# Patient Record
Sex: Female | Born: 1980
Health system: Southern US, Community
[De-identification: ages and names within clinical notes are randomized; demographics above are authoritative.]

## PROBLEM LIST (undated history)

## (undated) DIAGNOSIS — D649 Anemia, unspecified: Secondary | ICD-10-CM

## (undated) DIAGNOSIS — I10 Essential (primary) hypertension: Secondary | ICD-10-CM

## (undated) DIAGNOSIS — G932 Benign intracranial hypertension: Secondary | ICD-10-CM

## (undated) DIAGNOSIS — E785 Hyperlipidemia, unspecified: Secondary | ICD-10-CM

## (undated) DIAGNOSIS — G510 Bell's palsy: Secondary | ICD-10-CM

## (undated) DIAGNOSIS — G4730 Sleep apnea, unspecified: Secondary | ICD-10-CM

## (undated) HISTORY — PX: EYELID LACERATION REPAIR: SHX1564

## (undated) HISTORY — DX: Bell's palsy: G51.0

## (undated) HISTORY — PX: TONSILLECTOMY AND ADENOIDECTOMY: SUR1326

## (undated) HISTORY — DX: Benign intracranial hypertension: G93.2

## (undated) HISTORY — DX: Hyperlipidemia, unspecified: E78.5

## (undated) HISTORY — PX: NASAL SEPTUM SURGERY: SHX37

## (undated) HISTORY — PX: BLADDER SURGERY: SHX569

---
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
## 1999-05-07 ENCOUNTER — Emergency Department (HOSPITAL_COMMUNITY): Admission: EM | Admit: 1999-05-07 | Discharge: 1999-05-07 | Payer: Self-pay | Admitting: Emergency Medicine

## 1999-05-07 ENCOUNTER — Encounter: Payer: Self-pay | Admitting: Emergency Medicine

## 1999-10-25 ENCOUNTER — Other Ambulatory Visit: Admission: RE | Admit: 1999-10-25 | Discharge: 1999-10-25 | Payer: Self-pay | Admitting: *Deleted

## 1999-11-26 ENCOUNTER — Other Ambulatory Visit: Admission: RE | Admit: 1999-11-26 | Discharge: 1999-11-26 | Payer: Self-pay | Admitting: *Deleted

## 2007-12-31 ENCOUNTER — Encounter: Payer: Self-pay | Admitting: Pulmonary Disease

## 2008-01-31 ENCOUNTER — Ambulatory Visit: Payer: Self-pay | Admitting: Pulmonary Disease

## 2008-01-31 ENCOUNTER — Ambulatory Visit: Payer: Self-pay | Admitting: Cardiology

## 2008-01-31 ENCOUNTER — Telehealth (INDEPENDENT_AMBULATORY_CARE_PROVIDER_SITE_OTHER): Payer: Self-pay | Admitting: *Deleted

## 2008-01-31 DIAGNOSIS — R0602 Shortness of breath: Secondary | ICD-10-CM | POA: Insufficient documentation

## 2008-01-31 DIAGNOSIS — J309 Allergic rhinitis, unspecified: Secondary | ICD-10-CM | POA: Insufficient documentation

## 2008-01-31 DIAGNOSIS — G4730 Sleep apnea, unspecified: Secondary | ICD-10-CM

## 2008-01-31 DIAGNOSIS — J45909 Unspecified asthma, uncomplicated: Secondary | ICD-10-CM

## 2008-01-31 HISTORY — DX: Allergic rhinitis, unspecified: J30.9

## 2008-01-31 HISTORY — DX: Unspecified asthma, uncomplicated: J45.909

## 2008-01-31 HISTORY — DX: Shortness of breath: R06.02

## 2008-02-14 ENCOUNTER — Ambulatory Visit: Payer: Self-pay | Admitting: Pulmonary Disease

## 2010-08-02 ENCOUNTER — Emergency Department (HOSPITAL_COMMUNITY)
Admission: EM | Admit: 2010-08-02 | Discharge: 2010-08-02 | Disposition: A | Discharge status: Home / Self Care | Payer: Self-pay | Attending: Emergency Medicine | Admitting: Emergency Medicine

## 2010-08-02 DIAGNOSIS — R509 Fever, unspecified: Secondary | ICD-10-CM | POA: Insufficient documentation

## 2010-08-02 DIAGNOSIS — M7989 Other specified soft tissue disorders: Secondary | ICD-10-CM | POA: Insufficient documentation

## 2010-08-02 DIAGNOSIS — M79609 Pain in unspecified limb: Secondary | ICD-10-CM | POA: Insufficient documentation

## 2010-08-02 DIAGNOSIS — I10 Essential (primary) hypertension: Secondary | ICD-10-CM | POA: Insufficient documentation

## 2010-08-02 LAB — BASIC METABOLIC PANEL
BUN: 9 mg/dL (ref 6–23)
CO2: 27 mEq/L (ref 19–32)
Calcium: 9.3 mg/dL (ref 8.4–10.5)
Chloride: 101 mEq/L (ref 96–112)
Creatinine, Ser: 0.63 mg/dL (ref 0.4–1.2)
GFR calc Af Amer: >60 mL/min (ref >60)
GFR calc non Af Amer: >60 mL/min (ref >60)
Glucose, Bld: 88 mg/dL (ref 70–99)
Potassium: 3.6 mEq/L (ref 3.5–5.1)
Sodium: 137 mEq/L (ref 135–145)

## 2010-08-02 LAB — DIFFERENTIAL
Basophils Absolute: 0 10*3/uL (ref 0.0–0.1)
Basophils Relative: 0 % (ref 0–1)
Eosinophils Absolute: 0.4 10*3/uL (ref 0.0–0.7)
Eosinophils Relative: 3 % (ref 0–5)
Lymphocytes Relative: 20 % (ref 12–46)
Lymphs Abs: 2.8 10*3/uL (ref 0.7–4.0)
Monocytes Absolute: 0.7 10*3/uL (ref 0.1–1.0)
Monocytes Relative: 5 % (ref 3–12)
Neutro Abs: 10 10*3/uL — ABNORMAL HIGH (ref 1.7–7.7)
Neutrophils Relative %: 72 % (ref 43–77)

## 2010-08-02 LAB — CBC
HCT: 36.6 % (ref 36.0–46.0)
Hemoglobin: 12.4 g/dL (ref 12.0–15.0)
MCH: 28.3 pg (ref 26.0–34.0)
MCHC: 33.9 g/dL (ref 30.0–36.0)
MCV: 83.6 fL (ref 78.0–100.0)
Platelets: 330 10*3/uL (ref 150–400)
RBC: 4.38 MIL/uL (ref 3.87–5.11)
RDW: 13.3 % (ref 11.5–15.5)
WBC: 13.8 10*3/uL — ABNORMAL HIGH (ref 4.0–10.5)

## 2010-12-27 DIAGNOSIS — G4733 Obstructive sleep apnea (adult) (pediatric): Secondary | ICD-10-CM

## 2010-12-27 HISTORY — DX: Obstructive sleep apnea (adult) (pediatric): G47.33

## 2011-02-03 DIAGNOSIS — I10 Essential (primary) hypertension: Secondary | ICD-10-CM | POA: Insufficient documentation

## 2011-02-03 HISTORY — DX: Essential (primary) hypertension: I10

## 2011-06-02 DIAGNOSIS — IMO0002 Reserved for concepts with insufficient information to code with codable children: Secondary | ICD-10-CM | POA: Insufficient documentation

## 2011-06-02 HISTORY — DX: Reserved for concepts with insufficient information to code with codable children: IMO0002

## 2011-12-03 DIAGNOSIS — Z720 Tobacco use: Secondary | ICD-10-CM

## 2011-12-03 HISTORY — DX: Tobacco use: Z72.0

## 2012-02-04 DIAGNOSIS — F39 Unspecified mood [affective] disorder: Secondary | ICD-10-CM

## 2012-02-04 DIAGNOSIS — F4310 Post-traumatic stress disorder, unspecified: Secondary | ICD-10-CM

## 2012-02-04 HISTORY — DX: Unspecified mood (affective) disorder: F39

## 2012-02-04 HISTORY — DX: Post-traumatic stress disorder, unspecified: F43.10

## 2012-10-12 DIAGNOSIS — M1711 Unilateral primary osteoarthritis, right knee: Secondary | ICD-10-CM | POA: Insufficient documentation

## 2012-10-12 DIAGNOSIS — M222X9 Patellofemoral disorders, unspecified knee: Secondary | ICD-10-CM

## 2012-10-12 HISTORY — DX: Patellofemoral disorders, unspecified knee: M22.2X9

## 2012-10-12 HISTORY — DX: Unilateral primary osteoarthritis, right knee: M17.11

## 2012-12-03 DIAGNOSIS — K219 Gastro-esophageal reflux disease without esophagitis: Secondary | ICD-10-CM

## 2012-12-03 HISTORY — DX: Gastro-esophageal reflux disease without esophagitis: K21.9

## 2013-03-17 HISTORY — PX: VAGINAL HYSTERECTOMY: SHX2639

## 2013-08-01 DIAGNOSIS — N8502 Endometrial intraepithelial neoplasia [EIN]: Secondary | ICD-10-CM | POA: Insufficient documentation

## 2013-08-01 HISTORY — DX: Endometrial intraepithelial neoplasia (EIN): N85.02

## 2013-08-12 DIAGNOSIS — N3946 Mixed incontinence: Secondary | ICD-10-CM | POA: Insufficient documentation

## 2013-08-12 HISTORY — DX: Mixed incontinence: N39.46

## 2013-08-19 DIAGNOSIS — R339 Retention of urine, unspecified: Secondary | ICD-10-CM | POA: Insufficient documentation

## 2013-08-19 HISTORY — DX: Retention of urine, unspecified: R33.9

## 2013-09-01 DIAGNOSIS — M5417 Radiculopathy, lumbosacral region: Secondary | ICD-10-CM

## 2013-09-01 HISTORY — DX: Radiculopathy, lumbosacral region: M54.17

## 2014-02-21 DIAGNOSIS — F1010 Alcohol abuse, uncomplicated: Secondary | ICD-10-CM

## 2014-02-21 HISTORY — DX: Alcohol abuse, uncomplicated: F10.10

## 2014-05-02 DIAGNOSIS — F339 Major depressive disorder, recurrent, unspecified: Secondary | ICD-10-CM

## 2014-05-02 HISTORY — DX: Major depressive disorder, recurrent, unspecified: F33.9

## 2014-07-18 DIAGNOSIS — M5412 Radiculopathy, cervical region: Secondary | ICD-10-CM | POA: Insufficient documentation

## 2014-07-18 HISTORY — DX: Radiculopathy, cervical region: M54.12

## 2014-07-27 DIAGNOSIS — G43109 Migraine with aura, not intractable, without status migrainosus: Secondary | ICD-10-CM

## 2014-07-27 DIAGNOSIS — G4440 Drug-induced headache, not elsewhere classified, not intractable: Secondary | ICD-10-CM

## 2014-07-27 HISTORY — DX: Migraine with aura, not intractable, without status migrainosus: G43.109

## 2014-07-27 HISTORY — DX: Drug-induced headache, not elsewhere classified, not intractable: G44.40

## 2016-05-12 DIAGNOSIS — G4733 Obstructive sleep apnea (adult) (pediatric): Secondary | ICD-10-CM

## 2016-05-12 DIAGNOSIS — Z8659 Personal history of other mental and behavioral disorders: Secondary | ICD-10-CM | POA: Diagnosis not present

## 2016-05-12 DIAGNOSIS — F418 Other specified anxiety disorders: Secondary | ICD-10-CM | POA: Diagnosis not present

## 2016-05-12 DIAGNOSIS — R079 Chest pain, unspecified: Secondary | ICD-10-CM

## 2016-05-12 DIAGNOSIS — Z720 Tobacco use: Secondary | ICD-10-CM

## 2016-05-12 DIAGNOSIS — G629 Polyneuropathy, unspecified: Secondary | ICD-10-CM | POA: Diagnosis not present

## 2016-05-12 DIAGNOSIS — I10 Essential (primary) hypertension: Secondary | ICD-10-CM | POA: Diagnosis not present

## 2016-05-13 DIAGNOSIS — Z720 Tobacco use: Secondary | ICD-10-CM | POA: Diagnosis not present

## 2016-05-13 DIAGNOSIS — Z8659 Personal history of other mental and behavioral disorders: Secondary | ICD-10-CM | POA: Diagnosis not present

## 2016-05-13 DIAGNOSIS — I10 Essential (primary) hypertension: Secondary | ICD-10-CM | POA: Diagnosis not present

## 2016-05-13 DIAGNOSIS — F418 Other specified anxiety disorders: Secondary | ICD-10-CM | POA: Diagnosis not present

## 2016-05-13 DIAGNOSIS — G629 Polyneuropathy, unspecified: Secondary | ICD-10-CM | POA: Diagnosis not present

## 2016-05-13 DIAGNOSIS — R079 Chest pain, unspecified: Secondary | ICD-10-CM | POA: Diagnosis not present

## 2016-07-11 ENCOUNTER — Emergency Department (HOSPITAL_COMMUNITY): Payer: Medicare Other

## 2016-07-11 ENCOUNTER — Encounter (HOSPITAL_COMMUNITY): Payer: Self-pay

## 2016-07-11 ENCOUNTER — Emergency Department (HOSPITAL_COMMUNITY)
Admission: EM | Admit: 2016-07-11 | Discharge: 2016-07-12 | Disposition: A | Discharge status: Home / Self Care | Payer: Medicare Other | Attending: Emergency Medicine | Admitting: Emergency Medicine

## 2016-07-11 DIAGNOSIS — T461X5A Adverse effect of calcium-channel blockers, initial encounter: Secondary | ICD-10-CM | POA: Insufficient documentation

## 2016-07-11 DIAGNOSIS — Y638 Failure in dosage during other surgical and medical care: Secondary | ICD-10-CM | POA: Diagnosis not present

## 2016-07-11 DIAGNOSIS — R0789 Other chest pain: Secondary | ICD-10-CM | POA: Diagnosis not present

## 2016-07-11 DIAGNOSIS — J45909 Unspecified asthma, uncomplicated: Secondary | ICD-10-CM | POA: Diagnosis not present

## 2016-07-11 DIAGNOSIS — T887XXA Unspecified adverse effect of drug or medicament, initial encounter: Secondary | ICD-10-CM

## 2016-07-11 DIAGNOSIS — I10 Essential (primary) hypertension: Secondary | ICD-10-CM | POA: Insufficient documentation

## 2016-07-11 DIAGNOSIS — R079 Chest pain, unspecified: Secondary | ICD-10-CM | POA: Diagnosis present

## 2016-07-11 HISTORY — DX: Essential (primary) hypertension: I10

## 2016-07-11 HISTORY — DX: Sleep apnea, unspecified: G47.30

## 2016-07-11 LAB — CBC
HEMATOCRIT: 41.6 % (ref 36.0–46.0)
HEMOGLOBIN: 13.8 g/dL (ref 12.0–15.0)
MCH: 29.7 pg (ref 26.0–34.0)
MCHC: 33.2 g/dL (ref 30.0–36.0)
MCV: 89.5 fL (ref 78.0–100.0)
Platelets: 345 10*3/uL (ref 150–400)
RBC: 4.65 MIL/uL (ref 3.87–5.11)
RDW: 13.4 % (ref 11.5–15.5)
WBC: 15.3 10*3/uL — ABNORMAL HIGH (ref 4.0–10.5)

## 2016-07-11 LAB — I-STAT TROPONIN, ED: Troponin i, poc: 0 ng/mL (ref 0.00–0.08)

## 2016-07-11 LAB — BASIC METABOLIC PANEL
ANION GAP: 12 (ref 5–15)
BUN: 13 mg/dL (ref 6–20)
CO2: 24 mmol/L (ref 22–32)
Calcium: 9.4 mg/dL (ref 8.9–10.3)
Chloride: 99 mmol/L — ABNORMAL LOW (ref 101–111)
Creatinine, Ser: 0.64 mg/dL (ref 0.44–1.00)
GFR calc Af Amer: >60 mL/min (ref >60)
GFR calc non Af Amer: >60 mL/min (ref >60)
GLUCOSE: 112 mg/dL — AB (ref 65–99)
POTASSIUM: 3.5 mmol/L (ref 3.5–5.1)
Sodium: 135 mmol/L (ref 135–145)

## 2016-07-11 NOTE — ED Triage Notes (Signed)
Pt complaining of L sided chest pain and L arm and L jaw numbness and tingling. Pt states started taking diltiazem today, states pain began after medication.

## 2016-07-12 DIAGNOSIS — R0789 Other chest pain: Secondary | ICD-10-CM | POA: Diagnosis not present

## 2016-07-12 LAB — I-STAT TROPONIN, ED: TROPONIN I, POC: 0 ng/mL (ref 0.00–0.08)

## 2016-07-12 MED ORDER — KETOROLAC TROMETHAMINE 30 MG/ML IJ SOLN
30.0000 mg | Freq: Once | INTRAMUSCULAR | Status: AC
Start: 2016-07-12 — End: 2016-07-12
  Administered 2016-07-12: 30 mg via INTRAVENOUS
  Filled 2016-07-12: qty 1

## 2016-07-12 NOTE — ED Notes (Signed)
Pt stable, understands discharge instructions, and reasons for return.   

## 2016-07-12 NOTE — ED Provider Notes (Signed)
[facility]-EMERGENCY DEPT Provider Note   CSN: [id number] Arrival date & time: 07/11/16  2027  By signing my name below, I, Freida Busman, attest that this documentation has been prepared under the direction and in the presence of Pricilla Loveless, MD . Electronically Signed: Freida Busman, Scribe. 07/12/2016. 12:31 AM.  History   Chief Complaint Chief Complaint  Patient presents with  . Chest Pain  . Medication Reaction   The history is provided by the patient. No language interpreter was used.    HPI Comments:  Ellen Shaw is a 36 y.o. female with a history of HTN, and SVT, who presents to the Emergency Department complaining of sharp, central/left sided CP which began ~6 hours ago. Pt notes radiation of pain in the left shoulder and tingling sensation in the left hand. Pt notes associated mild SOB and an episode of nausea PTA. The nausea has resolved at this time.She was started on Diltiazem yesterday AM (07/11/2016). She states she felt lightheaded and "tingly" all over about 30 min-1 hour after taking this med for the first time.  She spoke with nurses at her cardiologist's office and was advised to come to the ED for further evaluation if she felt worse.  She denies back pain, abdominal pain, diaphoresis, diarrhea, and  vomiting. She cannot recall any exacerbating factors. No recent long periods of immobilization or h/o DVT/PE. No lower extremity swelling or pain.    Cardiologist - [facility])  Past Medical History:  Diagnosis Date  . Hypertension   . Sleep apnea     Patient Active Problem List   Diagnosis Date Noted  . ALLERGIC RHINITIS 01/31/2008  . ASTHMA 01/31/2008  . SLEEP APNEA 01/31/2008  . DYSPNEA 01/31/2008    History reviewed. No pertinent surgical history.  OB History    No data available       Home Medications    Prior to Admission medications   Not on File    Family History History reviewed. No pertinent family history.  Social History Social  History  Substance Use Topics  . Smoking status: Never Smoker  . Smokeless tobacco: Never Used  . Alcohol use Yes     Allergies   Esomeprazole magnesium; Fentanyl; Flonase [fluticasone propionate]; Oxycodone; and Penicillins   Review of Systems Review of Systems  Constitutional: Negative for diaphoresis.  Respiratory: Positive for shortness of breath.   Cardiovascular: Positive for chest pain. Negative for leg swelling.  Gastrointestinal: Positive for nausea. Negative for abdominal pain, diarrhea and vomiting.  Musculoskeletal: Negative for back pain.  Neurological: Positive for light-headedness.       +Paresthesias   All other systems reviewed and are negative.    Physical Exam Updated Vital Signs BP (!) 96/50   Pulse (!) 58   Temp 97.9 F (36.6 C) (Oral)   Resp 12   SpO2 94%   Physical Exam  Constitutional: She is oriented to person, place, and time. She appears well-developed and well-nourished. No distress.  Morbidly Obese   HENT:  Head: Normocephalic and atraumatic.  Right Ear: External ear normal.  Left Ear: External ear normal.  Nose: Nose normal.  Eyes: Right eye exhibits no discharge. Left eye exhibits no discharge.  Cardiovascular: Normal rate, regular rhythm and normal heart sounds.   2+ radial pulses bilaterally   Pulmonary/Chest: Effort normal and breath sounds normal. She exhibits no tenderness.  Abdominal: Soft. There is no tenderness.  Musculoskeletal: She exhibits no edema.  No shoulder tenderness Nml strength and sensation in  BUE   Neurological: She is alert and oriented to person, place, and time.  Skin: Skin is warm and dry. She is not diaphoretic.  Nursing note and vitals reviewed.    ED Treatments / Results  DIAGNOSTIC STUDIES:  Oxygen Saturation is 98% on RA, normal by my interpretation.    COORDINATION OF CARE:  12:25 AM Discussed treatment plan with pt at bedside and pt agreed to plan.  Labs (all labs ordered are listed, but  only abnormal results are displayed) Labs Reviewed  BASIC METABOLIC PANEL - Abnormal; Notable for the following:       Result Value   Chloride 99 (*)    Glucose, Bld 112 (*)    All other components within normal limits  CBC - Abnormal; Notable for the following:    WBC 15.3 (*)    All other components within normal limits  I-STAT TROPOININ, ED  I-STAT TROPOININ, ED    EKG  EKG Interpretation  Date/Time:  Friday July 11 2016 20:33:53 EDT Ventricular Rate:  74 PR Interval:  202 QRS Duration: 78 QT Interval:  418 QTC Calculation: 463 R Axis:   50 Text Interpretation:  Normal sinus rhythm Low voltage QRS Borderline ECG Confirmed by Montgomery Rothlisberger MD, Timara Loma [phone]) on 07/11/2016 11:37:24 PM       EKG Interpretation  Date/Time:  Saturday July 12 2016 01:40:53 EDT Ventricular Rate:  59 PR Interval:  202 QRS Duration: 100 QT Interval:  484 QTC Calculation: 480 R Axis:   39 Text Interpretation:  Sinus rhythm Borderline prolonged PR interval Low voltage, precordial leads no significant change since yesterday. Confirmed by Criss Alvine MD, Aboubacar Matsuo [id number]) on 07/12/2016 1:51:50 AM        Radiology Dg Chest 2 View  Result Date: 07/11/2016 CLINICAL DATA:  Left-sided chest pain. EXAM: CHEST  2 VIEW COMPARISON:  May 11, 2016 FINDINGS: The patient is rotated to the left. No pneumothorax. No pulmonary nodules, masses, or focal infiltrates. The cardiomediastinal silhouette is stable. IMPRESSION: No active cardiopulmonary disease. Electronically Signed   By: Gerome Sam III M.D   On: 07/11/2016 21:12    Procedures Procedures (including critical care time)  Medications Ordered in ED Medications  ketorolac (TORADOL) 30 MG/ML injection 30 mg (30 mg Intravenous Given 07/12/16 0050)     Initial Impression / Assessment and Plan / ED Course  I have reviewed the triage vital signs and the nursing notes.  Pertinent labs & imaging results that were available during my care of the patient  were reviewed by me and considered in my medical decision making (see chart for details).     Patient workup was unremarkable including no ECG changes or troponin elevation. I think PE, dissection, and ACS are all unlikely. She has an elevated WBC but the etiology is unclear. There are no infectious symptoms. I think otherwise she has some nonspecific side effects from the Cardizem that are somewhat expected such as fatigue and dizziness. No hypotension. Overall appears well.  Final Clinical Impressions(s) / ED Diagnoses   Final diagnoses:  Atypical chest pain  Medication side effect    New Prescriptions New Prescriptions   No medications on file   I personally performed the services described in this documentation, which was scribed in my presence. The recorded information has been reviewed and is accurate.     Pricilla Loveless, MD 07/12/16 1104

## 2016-07-15 DIAGNOSIS — I471 Supraventricular tachycardia, unspecified: Secondary | ICD-10-CM

## 2016-07-15 HISTORY — DX: Supraventricular tachycardia: I47.1

## 2016-07-15 HISTORY — DX: Supraventricular tachycardia, unspecified: I47.10

## 2016-07-22 DIAGNOSIS — F419 Anxiety disorder, unspecified: Secondary | ICD-10-CM

## 2016-07-22 DIAGNOSIS — M1990 Unspecified osteoarthritis, unspecified site: Secondary | ICD-10-CM

## 2016-07-22 HISTORY — DX: Unspecified osteoarthritis, unspecified site: M19.90

## 2016-07-22 HISTORY — DX: Anxiety disorder, unspecified: F41.9

## 2017-01-06 DIAGNOSIS — Z6841 Body Mass Index (BMI) 40.0 and over, adult: Secondary | ICD-10-CM | POA: Insufficient documentation

## 2017-01-06 HISTORY — DX: Body Mass Index (BMI) 40.0 and over, adult: Z684

## 2017-03-03 DIAGNOSIS — B3731 Acute candidiasis of vulva and vagina: Secondary | ICD-10-CM

## 2017-03-03 DIAGNOSIS — Z8619 Personal history of other infectious and parasitic diseases: Secondary | ICD-10-CM

## 2017-03-03 DIAGNOSIS — B373 Candidiasis of vulva and vagina: Secondary | ICD-10-CM

## 2017-03-03 HISTORY — DX: Candidiasis of vulva and vagina: B37.3

## 2017-03-03 HISTORY — DX: Acute candidiasis of vulva and vagina: B37.31

## 2017-03-03 HISTORY — DX: Personal history of other infectious and parasitic diseases: Z86.19

## 2017-06-21 DIAGNOSIS — I10 Essential (primary) hypertension: Secondary | ICD-10-CM | POA: Diagnosis not present

## 2017-06-21 DIAGNOSIS — R079 Chest pain, unspecified: Secondary | ICD-10-CM

## 2017-06-22 DIAGNOSIS — I10 Essential (primary) hypertension: Secondary | ICD-10-CM | POA: Diagnosis not present

## 2017-06-22 DIAGNOSIS — R079 Chest pain, unspecified: Secondary | ICD-10-CM | POA: Diagnosis not present

## 2017-06-22 DIAGNOSIS — R0602 Shortness of breath: Secondary | ICD-10-CM

## 2017-06-30 DIAGNOSIS — F6089 Other specific personality disorders: Secondary | ICD-10-CM | POA: Insufficient documentation

## 2017-06-30 DIAGNOSIS — R7303 Prediabetes: Secondary | ICD-10-CM

## 2017-06-30 HISTORY — DX: Prediabetes: R73.03

## 2017-06-30 HISTORY — DX: Other specific personality disorders: F60.89

## 2017-09-01 DIAGNOSIS — L304 Erythema intertrigo: Secondary | ICD-10-CM

## 2017-09-01 HISTORY — DX: Erythema intertrigo: L30.4

## 2017-10-12 DIAGNOSIS — L739 Follicular disorder, unspecified: Secondary | ICD-10-CM

## 2017-10-12 HISTORY — DX: Follicular disorder, unspecified: L73.9

## 2018-06-28 DIAGNOSIS — K5900 Constipation, unspecified: Secondary | ICD-10-CM | POA: Insufficient documentation

## 2018-06-28 HISTORY — DX: Constipation, unspecified: K59.00

## 2019-05-31 DIAGNOSIS — D72829 Elevated white blood cell count, unspecified: Secondary | ICD-10-CM

## 2019-12-07 ENCOUNTER — Ambulatory Visit: Payer: Medicare Other | Admitting: Diagnostic Neuroimaging

## 2019-12-30 ENCOUNTER — Ambulatory Visit
Admission: RE | Admit: 2019-12-30 | Discharge: 2019-12-30 | Disposition: A | Discharge status: Home / Self Care | Payer: Medicare Other | Source: Ambulatory Visit | Attending: Family Medicine | Admitting: Family Medicine

## 2019-12-30 ENCOUNTER — Other Ambulatory Visit: Payer: Self-pay | Admitting: Family Medicine

## 2019-12-30 DIAGNOSIS — Z8616 Personal history of COVID-19: Secondary | ICD-10-CM

## 2019-12-30 DIAGNOSIS — R0600 Dyspnea, unspecified: Secondary | ICD-10-CM

## 2019-12-30 MED ORDER — IOPAMIDOL (ISOVUE-370) INJECTION 76%
75.0000 mL | Freq: Once | INTRAVENOUS | Status: AC | PRN
  Administered 2019-12-30: 75 mL via INTRAVENOUS

## 2020-02-13 ENCOUNTER — Encounter: Payer: Self-pay | Admitting: Cardiology

## 2020-02-13 ENCOUNTER — Other Ambulatory Visit: Payer: Self-pay

## 2020-02-13 DIAGNOSIS — Z8669 Personal history of other diseases of the nervous system and sense organs: Secondary | ICD-10-CM | POA: Insufficient documentation

## 2020-02-13 DIAGNOSIS — G4730 Sleep apnea, unspecified: Secondary | ICD-10-CM | POA: Insufficient documentation

## 2020-02-13 DIAGNOSIS — E785 Hyperlipidemia, unspecified: Secondary | ICD-10-CM | POA: Insufficient documentation

## 2020-02-13 DIAGNOSIS — Z889 Allergy status to unspecified drugs, medicaments and biological substances status: Secondary | ICD-10-CM | POA: Insufficient documentation

## 2020-02-13 DIAGNOSIS — I10 Essential (primary) hypertension: Secondary | ICD-10-CM | POA: Insufficient documentation

## 2020-02-13 HISTORY — DX: Allergy status to unspecified drugs, medicaments and biological substances: Z88.9

## 2020-02-13 HISTORY — DX: Personal history of other diseases of the nervous system and sense organs: Z86.69

## 2020-02-15 ENCOUNTER — Ambulatory Visit (INDEPENDENT_AMBULATORY_CARE_PROVIDER_SITE_OTHER): Payer: Medicare Other | Admitting: Cardiology

## 2020-02-15 ENCOUNTER — Other Ambulatory Visit: Payer: Self-pay

## 2020-02-15 ENCOUNTER — Ambulatory Visit (INDEPENDENT_AMBULATORY_CARE_PROVIDER_SITE_OTHER): Payer: Medicare Other

## 2020-02-15 ENCOUNTER — Encounter: Payer: Self-pay | Admitting: *Deleted

## 2020-02-15 VITALS — BP 110/76 | HR 86 | Ht 69.0 in | Wt 340.0 lb

## 2020-02-15 DIAGNOSIS — R0602 Shortness of breath: Secondary | ICD-10-CM | POA: Diagnosis not present

## 2020-02-15 DIAGNOSIS — I10 Essential (primary) hypertension: Secondary | ICD-10-CM | POA: Insufficient documentation

## 2020-02-15 DIAGNOSIS — I4510 Unspecified right bundle-branch block: Secondary | ICD-10-CM

## 2020-02-15 DIAGNOSIS — R002 Palpitations: Secondary | ICD-10-CM

## 2020-02-15 DIAGNOSIS — R600 Localized edema: Secondary | ICD-10-CM

## 2020-02-15 HISTORY — DX: Essential (primary) hypertension: I10

## 2020-02-15 HISTORY — DX: Palpitations: R00.2

## 2020-02-15 HISTORY — DX: Unspecified right bundle-branch block: I45.10

## 2020-02-15 HISTORY — DX: Localized edema: R60.0

## 2020-02-15 HISTORY — DX: Morbid (severe) obesity due to excess calories: E66.01

## 2020-02-15 MED ORDER — POTASSIUM CHLORIDE CRYS ER 20 MEQ PO TBCR: 20.0000 meq | EXTENDED_RELEASE_TABLET | ORAL | 3 refills | Status: DC

## 2020-02-15 MED ORDER — FUROSEMIDE 20 MG PO TABS: 20.0000 mg | ORAL_TABLET | ORAL | 3 refills | Status: DC

## 2020-02-15 NOTE — Patient Instructions (Signed)
Medication Instructions:  Your physician has recommended you make the following change in your medication:  START: Potassium chloride 20 meq take one tablet by mouth daily on Tuesday, Thursday, and Saturday.  START: Furosemide 20 mg take one tablet by mouth daily on Tuesday, Thursday, and Saturday.  *If you need a refill on your cardiac medications before your next appointment, please call your pharmacy*   Lab Work: None If you have labs (blood work) drawn today and your tests are completely normal, you will receive your results only by: Marland Kitchen MyChart Message (if you have MyChart) OR . A paper copy in the mail If you have any lab test that is abnormal or we need to change your treatment, we will call you to review the results.   Testing/Procedures: Your physician has requested that you have an echocardiogram. Echocardiography is a painless test that uses sound waves to create images of your heart. It provides your doctor with information about the size and shape of your heart and how well your heart's chambers and valves are working. This procedure takes approximately one hour. There are no restrictions for this procedure.  A zio monitor was ordered today. It will remain on for 14 days. You will then return monitor and event diary in provided box. It takes 1-2 weeks for report to be downloaded and returned to [place]. We will call you with the results. If monitor falls off or has orange flashing light, please call Zio for further instructions.      Follow-Up: At [facility], you and your health needs are our priority.  As part of our continuing mission to provide you with exceptional heart care, we have created designated Provider Care Teams.  These Care Teams include your primary Cardiologist (physician) and Advanced Practice Providers (APPs -  Physician Assistants and Nurse Practitioners) who all work together to provide you with the care you need, when you need it.  We recommend signing up for  the patient portal called "MyChart".  Sign up information is provided on this After Visit Summary.  MyChart is used to connect with patients for Virtual Visits (Telemedicine).  Patients are able to view lab/test results, encounter notes, upcoming appointments, etc.  Non-urgent messages can be sent to your provider as well.   To learn more about what you can do with MyChart, go to [email or web address].    Your next appointment:   8 week(s)  The format for your next appointment:   In Person  Provider:   Thomasene Ripple, DO   Other Instructions

## 2020-02-15 NOTE — Progress Notes (Signed)
Cardiology Office Note:    Date:  02/15/2020   ID:  Ellen Shaw, DOB January 20, 1981, MRN [id number]  PCP:  Marlyn Corporal, PA  Cardiologist:  Thomasene Ripple, DO  Electrophysiologist:  None   Referring MD: Marlyn Corporal, PA   Chief Complaint  Patient presents with  . Palpitations    History of Present Illness:    Ellen Shaw is a 39 y.o. female with a hx of hypertension, morbid obesity, is here today to be evaluated for palpitations.  Patient tells me that she was doing well however over the last several weeks she has had intermittent palpitations.  She notes that with this she has also had bilateral leg edema.  She describes her palpitations as abrupt onset of fast heartbeat which lasted for few minutes at a time prior to resolution.  But during these times she feels significant shortness of breath and chest tightness.  She has not had any syncope episodes but sometimes she feels really lightheaded.  The shortness of breath does bother her.  Recently bilateral leg edema has gotten significantly worse her PCP did order 5 Lasix pills which had help with the tightness.  Past Medical History:  Diagnosis Date  . Alcohol abuse 02/21/2014  . Allergic rhinitis 01/31/2008   Qualifier: Diagnosis of  By: Truman Hayward (AAMA), Epifanio Lesches of this note might be different from the original. Overview:  Qualifier: Diagnosis of  By: Truman Hayward Duncan Dull), Lynnae Sandhoff  . Anxiety 07/22/2016  . Arthritis 07/22/2016   Formatting of this note might be different from the original. Knees Formatting of this note might be different from the original. Overview:  Knees  . Asthma 01/31/2008   Qualifier: Diagnosis of  By: Truman Hayward (Duncan Dull), Epifanio Lesches of this note might be different from the original. Overview:  Qualifier: Diagnosis of  By: Truman Hayward Duncan Dull), Lynnae Sandhoff  . ASTHMA 01/31/2008   Qualifier: Diagnosis of  By: Truman Hayward Duncan Dull), Lynnae Sandhoff    . Bell's palsy    age 78-14  . Body mass index (BMI) of 50.0 to  59.9 in adult (HCC) 01/06/2017  . Complex atypical endometrial hyperplasia 08/01/2013  . Constipation 06/28/2018  . DYSPNEA 01/31/2008   Qualifier: Diagnosis of  By: Vassie Loll MD, [place] Locket    . Episodic mood disorder (HCC) 02/04/2012  . Essential hypertension 02/03/2011  . Folliculitis 10/12/2017  . GERD (gastroesophageal reflux disease) 12/03/2012  . History of Helicobacter pylori infection 03/03/2017  . Hx of migraines 02/13/2020  . Hyperlipidemia   . Hypertension   . Intertrigo 09/01/2017  . Lumbosacral radiculopathy 09/01/2013  . Major depressive disorder, recurrent episode (HCC) 05/02/2014  . Medication overuse headache 07/27/2014  . Migraine with aura and without status migrainosus, not intractable 07/27/2014   Formatting of this note might be different from the original. Follows with Dr. Weston Settle in Neurology. HX pseudotumor cerebri  . Mixed stress and urge urinary incontinence 08/12/2013  . Multiple allergies 02/13/2020  . Obstructive sleep apnea on CPAP 12/27/2010   Formatting of this note might be different from the original. Uses Cpap  Formatting of this note might be different from the original. AHI=21.8 AutoPAP at 15-20 with an EPR=3  Last Assessment & Plan:  Formatting of this note might be different from the original. Relevant Hx: Course: Daily Update: Today's Plan:  . 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 Patellofemoral pain syndrome 10/12/2012  . Personal history of other diseases of the nervous system and sense organs 03/18/1995  .  Posttraumatic stress disorder 02/04/2012   Last Assessment & Plan:  Formatting of this note might be different from the original. Relevant Hx: Course: Daily Update: Today's Plan:  Electronically signed by: Seward Speck, MD Resident 12/22/14 [phone]  . Prediabetes 06/30/2017   Formatting of this note might be different from the original. A1C 6.1 on 06/2017  . Primary osteoarthritis of right knee 10/12/2012  . Pseudotumor   . Right cervical  radiculopathy 07/18/2014  . Sleep apnea   . SVT (supraventricular tachycardia) (HCC) 07/15/2016  . Tobacco use 12/03/2011  . Umbilical cyst 06/02/2011  . Urinary retention 08/19/2013   Formatting of this note might be different from the original. S/P interstim placement with Urology  . Vulvovaginal candidiasis 03/03/2017    Past Surgical History:  Procedure Laterality Date  . BLADDER SURGERY  2016  . TONSILLECTOMY AND ADENOIDECTOMY    . VAGINAL HYSTERECTOMY  2015    Current Medications: Current Meds  Medication Sig  . albuterol (VENTOLIN HFA) 108 (90 Base) MCG/ACT inhaler every 4 (four) hours as needed. Every 4-6 hours as needed  . Cholecalciferol (VITAMIN D3) 1.25 MG (50000 UT) CAPS Take 1 capsule by mouth once a week.  Marland Kitchen EPINEPHrine 0.3 mg/0.3 mL IJ SOAJ injection Inject into the muscle.  . linaclotide (LINZESS) 290 MCG CAPS capsule Take 290 mcg by mouth daily before breakfast.  . loratadine (CLARITIN) 10 MG tablet Take 10 mg by mouth daily.  . ondansetron (ZOFRAN) 4 MG tablet Take 4 mg by mouth every 8 (eight) hours as needed for nausea or vomiting.  Marland Kitchen RISPERDAL CONSTA 25 MG injection Inject into the muscle every 14 (fourteen) days.      Allergies:   Ciprofloxacin, Clindamycin, Esomeprazole magnesium, Nitrofurantoin, Oxycodone, Sulfa antibiotics, Sulfamethoxazole-trimethoprim, Trimethoprim, Bee pollen, Citrus, Doxycycline, Fluticasone, Penicillins, Pollen extract, Bee venom, Esomeprazole magnesium, Fentanyl, Flonase [fluticasone propionate], Paliperidone, Pantoprazole, and Orange oil   Social History   Socioeconomic History  . Marital status: Legally Separated    Spouse name: Not on file  . Number of children: Not on file  . Years of education: Not on file  . Highest education level: Not on file  Occupational History  . Not on file  Tobacco Use  . Smoking status: Former Smoker    Quit date: 01/19/2020    Years since quitting: 0.0  . Smokeless tobacco: Never Used  Substance and  Sexual Activity  . Alcohol use: Never  . Drug use: No  . Sexual activity: Not on file  Other Topics Concern  . Not on file  Social History Narrative  . Not on file   Social Determinants of Health   Financial Resource Strain:   . Difficulty of Paying Living Expenses: Not on file  Food Insecurity:   . Worried About Programme researcher, broadcasting/film/video in the Last Year: Not on file  . Ran Out of Food in the Last Year: Not on file  Transportation Needs:   . Lack of Transportation (Medical): Not on file  . Lack of Transportation (Non-Medical): Not on file  Physical Activity:   . Days of Exercise per Week: Not on file  . Minutes of Exercise per Session: Not on file  Stress:   . Feeling of Stress : Not on file  Social Connections:   . Frequency of Communication with Friends and Family: Not on file  . Frequency of Social Gatherings with Friends and Family: Not on file  . Attends Religious Services: Not on file  . Active Member of Clubs  or Organizations: Not on file  . Attends BankerClub or Organization Meetings: Not on file  . Marital Status: Not on file     Family History: The patient's family history includes Atrial fibrillation in her father; COPD in her father; Congestive Heart Failure in her father; Glaucoma in her father; Heart attack in her maternal grandmother; High Cholesterol in her mother; Hypertension in her father; Kidney disease in her father; Lung cancer in her father and paternal grandmother; Macular degeneration in her father; Stroke in her father.  ROS:   Review of Systems  Constitution: Negative for decreased appetite, fever and weight gain.  HENT: Negative for congestion, ear discharge, hoarse voice and sore throat.   Eyes: Negative for discharge, redness, vision loss in right eye and visual halos.  Cardiovascular: Reports palpitations, shortness of breath, bilateral leg edema and chest tightness.  Negative for orthopnea respiratory: Negative for cough, hemoptysis, shortness of breath  and snoring.   Endocrine: Negative for heat intolerance and polyphagia.  Hematologic/Lymphatic: Negative for bleeding problem. Does not bruise/bleed easily.  Skin: Negative for flushing, nail changes, rash and suspicious lesions.  Musculoskeletal: Negative for arthritis, joint pain, muscle cramps, myalgias, neck pain and stiffness.  Gastrointestinal: Negative for abdominal pain, bowel incontinence, diarrhea and excessive appetite.  Genitourinary: Negative for decreased libido, genital sores and incomplete emptying.  Neurological: Negative for brief paralysis, focal weakness, headaches and loss of balance.  Psychiatric/Behavioral: Negative for altered mental status, depression and suicidal ideas.  Allergic/Immunologic: Negative for HIV exposure and persistent infections.    EKGs/Labs/Other Studies Reviewed:    The following studies were reviewed today:   EKG:  The ekg ordered today demonstrates sinus rhythm, heart rate 86 bpm with underlying right bundle branch block nonspecific ST changes.  No prior EKG for comparison.  Recent Labs: No results found for requested labs within last 8760 hours.  Recent Lipid Panel No results found for: CHOL, TRIG, HDL, CHOLHDL, VLDL, LDLCALC, LDLDIRECT  Physical Exam:    VS:  BP 110/76 (BP Location: Left Arm, Patient Position: Sitting, Cuff Size: Large)   Pulse 86   Ht 5\' 9"  (1.753 m)   Wt (!) 340 lb (154.2 kg)   SpO2 98%   BMI 50.21 kg/m     Wt Readings from Last 3 Encounters:  02/15/20 (!) 340 lb (154.2 kg)  01/26/20 (!) 344 lb (156 kg)     GEN: Well nourished, well developed in no acute distress HEENT: Normal NECK: No JVD; No carotid bruits LYMPHATICS: No lymphadenopathy CARDIAC: S1S2 noted,RRR, no murmurs, rubs, gallops RESPIRATORY:  Clear to auscultation without rales, wheezing or rhonchi  ABDOMEN: Soft, non-tender, non-distended, +bowel sounds, no guarding. EXTREMITIES: No edema, No cyanosis, no clubbing MUSCULOSKELETAL:  No  deformity  SKIN: Warm and dry NEUROLOGIC:  Alert and oriented x 3, non-focal PSYCHIATRIC:  Normal affect, good insight  ASSESSMENT:    1. Palpitations   2. Shortness of breath   3. Morbid obesity (HCC)   4. Primary hypertension   5. Bilateral leg edema   6. Right bundle branch block    PLAN:    I would like to rule out a cardiovascular etiology of this palpitation, therefore at this time I would like to placed a zio patch for 14 days.   In additon for her shortness of breath and bilateral leg edema a transthoracic echocardiogram will be ordered to assess LV/RV function and any structural abnormalities. Once these testing have been performed amd reviewed further reccomendations will be made. For now,  I do reccomend that the patient goes to the nearest ED if  symptoms recur.  Also, started patient on Lasix 20 mg Tuesday Thursdays and Saturdays with potassium supplement.  Hoping this will give her some relief as I think this could be multifactorial first I like to rule out any cardiomyopathy but venous insufficiency may be playing a role.  The patient understands the need to lose weight with diet and exercise. We have discussed specific strategies for this.  The patient is in agreement with the above plan. The patient left the office in stable condition.  The patient will follow up in 3 months or sooner if needed.   Medication Adjustments/Labs and Tests Ordered: Current medicines are reviewed at length with the patient today.  Concerns regarding medicines are outlined above.  Orders Placed This Encounter  Procedures  . LONG TERM MONITOR (3-14 DAYS)  . EKG 12-Lead  . ECHOCARDIOGRAM COMPLETE   Meds ordered this encounter  Medications  . furosemide (LASIX) 20 MG tablet    Sig: Take 1 tablet (20 mg total) by mouth 3 (three) times a week.    Dispense:  20 tablet    Refill:  3  . potassium chloride SA (KLOR-CON) 20 MEQ tablet    Sig: Take 1 tablet (20 mEq total) by mouth 3 (three)  times a week.    Dispense:  20 tablet    Refill:  3    Patient Instructions  Medication Instructions:  Your physician has recommended you make the following change in your medication:  START: Potassium chloride 20 meq take one tablet by mouth daily on Tuesday, Thursday, and Saturday.  START: Furosemide 20 mg take one tablet by mouth daily on Tuesday, Thursday, and Saturday.  *If you need a refill on your cardiac medications before your next appointment, please call your pharmacy*   Lab Work: None If you have labs (blood work) drawn today and your tests are completely normal, you will receive your results only by: Marland Kitchen MyChart Message (if you have MyChart) OR . A paper copy in the mail If you have any lab test that is abnormal or we need to change your treatment, we will call you to review the results.   Testing/Procedures: Your physician has requested that you have an echocardiogram. Echocardiography is a painless test that uses sound waves to create images of your heart. It provides your doctor with information about the size and shape of your heart and how well your heart's chambers and valves are working. This procedure takes approximately one hour. There are no restrictions for this procedure.  A zio monitor was ordered today. It will remain on for 14 days. You will then return monitor and event diary in provided box. It takes 1-2 weeks for report to be downloaded and returned to [place]. We will call you with the results. If monitor falls off or has orange flashing light, please call Zio for further instructions.      Follow-Up: At [facility], you and your health needs are our priority.  As part of our continuing mission to provide you with exceptional heart care, we have created designated Provider Care Teams.  These Care Teams include your primary Cardiologist (physician) and Advanced Practice Providers (APPs -  Physician Assistants and Nurse Practitioners) who all work together to  provide you with the care you need, when you need it.  We recommend signing up for the patient portal called "MyChart".  Sign up information is provided on this After  Visit Summary.  MyChart is used to connect with patients for Virtual Visits (Telemedicine).  Patients are able to view lab/test results, encounter notes, upcoming appointments, etc.  Non-urgent messages can be sent to your provider as well.   To learn more about what you can do with MyChart, go to [email or web address].    Your next appointment:   8 week(s)  The format for your next appointment:   In Person  Provider:   Thomasene Ripple, DO   Other Instructions      Adopting a Healthy Lifestyle.  Know what a healthy weight is for you (roughly BMI <25) and aim to maintain this   Aim for 7+ servings of fruits and vegetables daily   65-80+ fluid ounces of water or unsweet tea for healthy kidneys   Limit to max 1 drink of alcohol per day; avoid smoking/tobacco   Limit animal fats in diet for cholesterol and heart health - choose grass fed whenever available   Avoid highly processed foods, and foods high in saturated/trans fats   Aim for low stress - take time to unwind and care for your mental health   Aim for 150 min of moderate intensity exercise weekly for heart health, and weights twice weekly for bone health   Aim for 7-9 hours of sleep daily   When it comes to diets, agreement about the perfect plan isnt easy to find, even among the experts. Experts at the [facility] [employer] developed an idea known as the Healthy Eating Plate. Just imagine a plate divided into logical, healthy portions.   The emphasis is on diet quality:   Load up on vegetables and fruits - one-half of your plate: Aim for color and variety, and remember that potatoes dont count.   Go for whole grains - one-quarter of your plate: Whole wheat, barley, wheat berries, quinoa, oats, brown rice, and foods made with them. If you  want pasta, go with whole wheat pasta.   Protein power - one-quarter of your plate: Fish, chicken, beans, and nuts are all healthy, versatile protein sources. Limit red meat.   The diet, however, does go beyond the plate, offering a few other suggestions.   Use healthy plant oils, such as olive, canola, soy, corn, sunflower and peanut. Check the labels, and avoid partially hydrogenated oil, which have unhealthy trans fats.   If youre thirsty, drink water. Coffee and tea are good in moderation, but skip sugary drinks and limit milk and dairy products to one or two daily servings.   The type of carbohydrate in the diet is more important than the amount. Some sources of carbohydrates, such as vegetables, fruits, whole grains, and beans-are healthier than others.   Finally, stay active  Signed, Thomasene Ripple, DO  02/15/2020 1:35 PM    [facility]

## 2020-02-17 ENCOUNTER — Encounter: Payer: Self-pay | Admitting: Diagnostic Neuroimaging

## 2020-02-17 ENCOUNTER — Ambulatory Visit (INDEPENDENT_AMBULATORY_CARE_PROVIDER_SITE_OTHER): Payer: Medicare Other | Admitting: Diagnostic Neuroimaging

## 2020-02-17 ENCOUNTER — Other Ambulatory Visit: Payer: Self-pay

## 2020-02-17 VITALS — BP 131/80 | HR 87 | Ht 69.0 in | Wt 341.0 lb

## 2020-02-17 DIAGNOSIS — G932 Benign intracranial hypertension: Secondary | ICD-10-CM | POA: Diagnosis not present

## 2020-02-17 DIAGNOSIS — G44209 Tension-type headache, unspecified, not intractable: Secondary | ICD-10-CM | POA: Diagnosis not present

## 2020-02-17 NOTE — Progress Notes (Signed)
[facility]  PATIENT: Ellen Shaw DOB: [birth year]  REFERRING CLINICIAN: Albin Shaw, OD HISTORY FROM: patient  REASON FOR VISIT: new consult    HISTORICAL  CHIEF COMPLAINT:  Chief Complaint  Patient presents with  . New Patient (Initial Visit)    pt alone, rm 7. pt was diagnosed with pseudotumor. she states that when she recently went to her Eye MD, he mentioned there appeared to be swelling noted. he referred here to reassess. she has been having headaches when she first wakes up in the morning.     HISTORY OF PRESENT ILLNESS:   39 year old female here for evaluation of headaches and history of pseudotumor cerebri.  Patient reports diagnosis of pseudotumor cerebri around the year 2000, managed by Dr. Johnell Shaw.  Apparently she was on topiramate and acetazolamide at that time.  Symptoms eventually improved.  She has been off of medication for several years.  In early 2021 headaches returned.  In July / August 2021 patient went to [facility] ophthalmology and neurology for evaluation and pseudotumor cerebri was ruled out.  She was treated empirically for migraine with topiramate but this caused side effect of tingling.  She was changed to Trokendi but she could not afford this due to insurance.  Patient followed up with her local optometrist Ellen Shaw in July 2021 as well and review of his notes mentions mild elevation of optic nerves.  Patient referred here for second opinion.  Since that time patient is having 2-3 headaches per month with mild pressure aching sensation in the back of her head and behind her eyes.  No nausea or vomiting.  No sensitive light or sound.  She has some occasional blurred vision.  She has some mild ringing in the right ear.  No transient visual obscurations.  No specific triggering or aggravating factors.  Patient has noted that since sleeping in the recliner for past few months her headaches have resolved.  Patient has sleep apnea  and is on CPAP.    REVIEW OF SYSTEMS: Full 14 system review of systems performed and negative with exception of: As per HPI.  ALLERGIES: Allergies  Allergen Reactions  . Ciprofloxacin Anaphylaxis, Rash and Shortness Of Breath    SOB   . Clindamycin Shortness Of Breath  . Esomeprazole Magnesium Hives and Shortness Of Breath  . Nitrofurantoin Shortness Of Breath  . Oxycodone Other (See Comments)    "shaking" Other: "I passed out." Other: convulsions "shaking" Other reaction(s): Dizziness, Hypotension, Wheezing (ALLERGY/intolerance) "shaking" Other: convulsions "shaking"   . Sulfa Antibiotics Rash    Other reaction(s): Rash, Shortness of breath   . Sulfamethoxazole-Trimethoprim Rash and Shortness Of Breath  . Trimethoprim     Other reaction(s): Rash, Rash, Shortness of breath  . Bee Pollen Other (See Comments)    Other reaction(s): Cough, Cough (ALLERGY/intolerance) Other-sneeze sneeze  . Citrus Hives  . Doxycycline Other (See Comments)    Muscle spasms  Bad muscle spasms   . Fluticasone Other (See Comments) and Rash    headache Other reaction(s): Other (See Comments) "Headache and nosebleed" headache   . Penicillins Rash  . Pollen Extract Other (See Comments)    Stuffy nose Other-sneeze Stuffy nose   . Bee Venom Swelling    Shortness of breathing  . Buspirone   . Esomeprazole Magnesium   . Fentanyl Other (See Comments)    "I pass out." Fainting  States "I passed out." fainting Other reaction(s): Other (See Comments) "I pass out." fainting   .  Flonase [Fluticasone Propionate]     "Headache and nosebleed"  . Lexapro [Escitalopram]   . Paliperidone     Other reaction(s): Dystonia  . Pantoprazole Hives    Relieved with Benadryl  . Orange Oil Rash    HOME MEDICATIONS: Outpatient Medications Prior to Visit  Medication Sig Dispense Refill  . albuterol (VENTOLIN HFA) 108 (90 Base) MCG/ACT inhaler every 4 (four) hours as needed. Every 4-6 hours as  needed    . Cholecalciferol (VITAMIN D3) 1.25 MG (50000 UT) CAPS Take 1 capsule by mouth once a week.    Marland Kitchen. EPINEPHrine 0.3 mg/0.3 mL IJ SOAJ injection Inject into the muscle.    . ferrous sulfate 325 (65 FE) MG tablet Take 325 mg by mouth daily with breakfast.    . furosemide (LASIX) 20 MG tablet Take 1 tablet (20 mg total) by mouth 3 (three) times a week. 20 tablet 3  . linaclotide (LINZESS) 290 MCG CAPS capsule Take 290 mcg by mouth daily before breakfast.    . loratadine (CLARITIN) 10 MG tablet Take 10 mg by mouth daily.    . ondansetron (ZOFRAN) 4 MG tablet Take 4 mg by mouth every 8 (eight) hours as needed for nausea or vomiting.    . potassium chloride SA (KLOR-CON) 20 MEQ tablet Take 1 tablet (20 mEq total) by mouth 3 (three) times a week. 20 tablet 3  . RISPERDAL CONSTA 25 MG injection Inject into the muscle every 14 (fourteen) days.     Marland Kitchen. sertraline (ZOLOFT) 50 MG tablet Take 50 mg by mouth daily.     No facility-administered medications prior to visit.    PAST MEDICAL HISTORY: Past Medical History:  Diagnosis Date  . Alcohol abuse 02/21/2014  . Allergic rhinitis 01/31/2008   Qualifier: Diagnosis of  By: Ellen HaywardFord,CMA (AAMA), Ellen Shaw    Formatting of this note might be different from the original. Overview:  Qualifier: Diagnosis of  By: Ellen HaywardFord,CMA Ellen Shaw(AAMA), Lynnae SandhoffSusanne  . Anxiety 07/22/2016  . Arthritis 07/22/2016   Formatting of this note might be different from the original. Knees Formatting of this note might be different from the original. Overview:  Knees  . Asthma 01/31/2008   Qualifier: Diagnosis of  By: Ellen HaywardFord,CMA (Ellen DullAAMA), Ellen Shaw    Formatting of this note might be different from the original. Overview:  Qualifier: Diagnosis of  By: Ellen HaywardFord,CMA Ellen Shaw(AAMA), Lynnae SandhoffSusanne  . ASTHMA 01/31/2008   Qualifier: Diagnosis of  By: Ellen HaywardFord,CMA Ellen Shaw(AAMA), Lynnae SandhoffSusanne    . Bell's palsy    age 41-14  . Body mass index (BMI) of 50.0 to 59.9 in adult (HCC) 01/06/2017  . Complex atypical endometrial hyperplasia 08/01/2013  .  Constipation 06/28/2018  . DYSPNEA 01/31/2008   Qualifier: Diagnosis of  By: Vassie LollAlva MD, Comer Locketakesh V.    . Episodic mood disorder (HCC) 02/04/2012  . Essential hypertension 02/03/2011  . Folliculitis 10/12/2017  . GERD (gastroesophageal reflux disease) 12/03/2012  . History of Helicobacter pylori infection 03/03/2017  . Hx of migraines 02/13/2020  . Hyperlipidemia   . Hypertension   . Intertrigo 09/01/2017  . Lumbosacral radiculopathy 09/01/2013  . Major depressive disorder, recurrent episode (HCC) 05/02/2014  . Medication overuse headache 07/27/2014  . Migraine with aura and without status migrainosus, not intractable 07/27/2014   Formatting of this note might be different from the original. Follows with Dr. Weston SettleWise in Neurology. HX pseudotumor cerebri  . Mixed stress and urge urinary incontinence 08/12/2013  . Multiple allergies 02/13/2020  . Obstructive sleep apnea on CPAP 12/27/2010   Formatting  of this note might be different from the original. Uses Cpap  Formatting of this note might be different from the original. AHI=21.8 AutoPAP at 15-20 with an EPR=3  Last Assessment & Plan:  Formatting of this note might be different from the original. Relevant Hx: Course: Daily Update: Today's Plan:  . 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 Patellofemoral pain syndrome 10/12/2012  . Personal history of other diseases of the nervous system and sense organs 03/18/1995  . Posttraumatic stress disorder 02/04/2012   Last Assessment & Plan:  Formatting of this note might be different from the original. Relevant Hx: Course: Daily Update: Today's Plan:  Electronically signed by: Seward Speck, MD Resident 12/22/14 [phone]  . Prediabetes 06/30/2017   Formatting of this note might be different from the original. A1C 6.1 on 06/2017  . Primary osteoarthritis of right knee 10/12/2012  . Pseudotumor   . Right cervical radiculopathy 07/18/2014  . Sleep apnea   . SVT (supraventricular tachycardia) (HCC) 07/15/2016  .  Tobacco use 12/03/2011  . Umbilical cyst 06/02/2011  . Urinary retention 08/19/2013   Formatting of this note might be different from the original. S/P interstim placement with Urology  . Vulvovaginal candidiasis 03/03/2017    PAST SURGICAL HISTORY: Past Surgical History:  Procedure Laterality Date  . BLADDER SURGERY  2016  . TONSILLECTOMY AND ADENOIDECTOMY    . VAGINAL HYSTERECTOMY  2015    FAMILY HISTORY: Family History  Problem Relation Age of Onset  . COPD Father   . Lung cancer Father   . Congestive Heart Failure Father   . Hypertension Father   . Kidney disease Father   . Atrial fibrillation Father   . Macular degeneration Father   . Glaucoma Father   . Stroke Father   . High Cholesterol Mother   . Heart attack Maternal Grandmother   . Lung cancer Paternal Grandmother     SOCIAL HISTORY: Social History   Socioeconomic History  . Marital status: Legally Separated    Spouse name: Not on file  . Number of children: Not on file  . Years of education: Not on file  . Highest education level: Not on file  Occupational History  . Not on file  Tobacco Use  . Smoking status: Former Smoker    Quit date: 01/19/2020    Years since quitting: 0.0  . Smokeless tobacco: Never Used  Substance and Sexual Activity  . Alcohol use: Not Currently  . Drug use: No  . Sexual activity: Not on file  Other Topics Concern  . Not on file  Social History Narrative  . Not on file   Social Determinants of Health   Financial Resource Strain:   . Difficulty of Paying Living Expenses: Not on file  Food Insecurity:   . Worried About Programme researcher, broadcasting/film/video in the Last Year: Not on file  . Ran Out of Food in the Last Year: Not on file  Transportation Needs:   . Lack of Transportation (Medical): Not on file  . Lack of Transportation (Non-Medical): Not on file  Physical Activity:   . Days of Exercise per Week: Not on file  . Minutes of Exercise per Session: Not on file  Stress:   . Feeling  of Stress : Not on file  Social Connections:   . Frequency of Communication with Friends and Family: Not on file  . Frequency of Social Gatherings with Friends and Family: Not on file  . Attends Religious Services: Not on  file  . Active Member of Clubs or Organizations: Not on file  . Attends Banker Meetings: Not on file  . Marital Status: Not on file  Intimate Partner Violence:   . Fear of Current or Ex-Partner: Not on file  . Emotionally Abused: Not on file  . Physically Abused: Not on file  . Sexually Abused: Not on file     PHYSICAL EXAM  GENERAL EXAM/CONSTITUTIONAL: Vitals:  Vitals:   02/17/20 0842  BP: 131/80  Pulse: 87  Weight: (!) 341 lb (154.7 kg)  Height: 5\' 9"  (1.753 m)     Body mass index is 50.36 kg/m. Wt Readings from Last 3 Encounters:  02/17/20 (!) 341 lb (154.7 kg)  02/15/20 (!) 340 lb (154.2 kg)  01/26/20 (!) 344 lb (156 kg)     Patient is in no distress; well developed, nourished and groomed; neck is supple  CARDIOVASCULAR:  Examination of carotid arteries is normal; no carotid bruits  Regular rate and rhythm, no murmurs  Examination of peripheral vascular system by observation and palpation is normal  EYES:  Ophthalmoscopic exam of optic discs and posterior segments is normal; no papilledema or hemorrhages  No exam data present  MUSCULOSKELETAL:  Gait, strength, tone, movements noted in Neurologic exam below  NEUROLOGIC: MENTAL STATUS:  No flowsheet data found.  awake, alert, oriented to person, place and time  recent and remote memory intact  normal attention and concentration  language fluent, comprehension intact, naming intact  fund of knowledge appropriate  CRANIAL NERVE:   2nd - no papilledema on fundoscopic exam  2nd, 3rd, 4th, 6th - pupils equal and reactive to light, visual fields full to confrontation, extraocular muscles intact, no nystagmus  5th - facial sensation symmetric  7th - facial  strength symmetric  8th - hearing intact  9th - palate elevates symmetrically, uvula midline  11th - shoulder shrug symmetric  12th - tongue protrusion midline  MOTOR:   normal bulk and tone, full strength in the BUE, BLE; EXCEPT DECR IN LLE (4/5)  SENSORY:   normal and symmetric to light touch, temperature, vibration; EXCEPT DECR IN LEFT FOOT  COORDINATION:   finger-nose-finger, fine finger movements normal  REFLEXES:   deep tendon reflexes TRACE and symmetric  GAIT/STATION:   narrow based gait; USES SINGLE POINT CANE     DIAGNOSTIC DATA (LABS, IMAGING, TESTING) - I reviewed patient records, labs, notes, testing and imaging myself where available.  Lab Results  Component Value Date   WBC 15.3 (H) 07/11/2016   HGB 13.8 07/11/2016   HCT 41.6 07/11/2016   MCV 89.5 07/11/2016   PLT 345 07/11/2016      Component Value Date/Time   NA 135 07/11/2016 2036   K 3.5 07/11/2016 2036   CL 99 (L) 07/11/2016 2036   CO2 24 07/11/2016 2036   GLUCOSE 112 (H) 07/11/2016 2036   BUN 13 07/11/2016 2036   CREATININE 0.64 07/11/2016 2036   CALCIUM 9.4 07/11/2016 2036   GFRNONAA >60 07/11/2016 2036   GFRAA >60 07/11/2016 2036   No results found for: CHOL, HDL, LDLCALC, LDLDIRECT, TRIG, CHOLHDL No results found for: 2037 No results found for: VITAMINB12 No results found for: TSH   03/30/19 MRI brain - No acute intracranial abnormality.      ASSESSMENT AND PLAN  39 y.o. year old female here with:  Dx:  1. Tension headache   2. IIH (idiopathic intracranial hypertension)       PLAN:  tension headaches (could  be related to OSA, obesity, dehydration) - 2-3 per month; mild; no associated symptoms - continue tylenol as needed  history of idiopathic intracranial hypertension (pseudotumor cerebri)  - normal optic discs per ophthalmology note 09/27/19 (Dr. Maurice Small) - mild optic disc elevation per optometry clinic note 10/05/19 (Ellen Shaw) - normal exam today; I  do not think IIH is active at this time - follow up with Ellen Shaw and [facility] neurology, ophthalmology, optometry  Return for return to PCP.    Suanne Marker, MD 02/17/2020, 9:18 AM Certified in Neurology, Neurophysiology and Neuroimaging  [facility] [address], [address] [place], [place] [address] [phone]

## 2020-02-17 NOTE — Patient Instructions (Signed)
tension headaches - 2-3 per month; mild; no associated symptoms - continue tylenol as needed  history of idiopathic intracranial hypertension (pseudotumor cerebri)  - normal optic discs per ophthalmology note 09/27/19 (Dr. Maurice Small) - mild optic disc elevation per optometry clinic note 10/05/19 (Dr. Precious Bard) - normal exam today; I do not think IIH is active at this time - follow up with Dr. Precious Bard and [facility] neurology, ophthalmology, optometry

## 2020-03-12 ENCOUNTER — Telehealth: Payer: Self-pay

## 2020-03-12 NOTE — Telephone Encounter (Signed)
Spoke with patient regarding results and recommendation.  Patient verbalizes understanding and is agreeable to plan of care. Advised patient to call back with any issues or concerns.  

## 2020-03-12 NOTE — Telephone Encounter (Signed)
-----   Message from Thomasene Ripple, DO sent at 03/08/2020  5:18 PM EST ----- Please have the patient come in sooner than February 1 to discuss her monitor results with me.

## 2020-03-13 ENCOUNTER — Other Ambulatory Visit: Payer: Self-pay

## 2020-03-13 ENCOUNTER — Ambulatory Visit (INDEPENDENT_AMBULATORY_CARE_PROVIDER_SITE_OTHER): Payer: Medicare Other

## 2020-03-13 ENCOUNTER — Telehealth: Payer: Self-pay

## 2020-03-13 DIAGNOSIS — R0602 Shortness of breath: Secondary | ICD-10-CM

## 2020-03-13 LAB — ECHOCARDIOGRAM COMPLETE
Area-P 1/2: 3.77 cm2
S' Lateral: 2.7 cm

## 2020-03-13 MED ORDER — PERFLUTREN LIPID MICROSPHERE: 1.0000 mL | INTRAVENOUS | Status: AC | PRN

## 2020-03-13 NOTE — Telephone Encounter (Signed)
Patient notified of results and verbalized understanding.  

## 2020-03-13 NOTE — Telephone Encounter (Signed)
-----   Message from Thomasene Ripple, DO sent at 03/13/2020  4:33 PM EST ----- Great news echo normal

## 2020-03-13 NOTE — Progress Notes (Signed)
Complete echocardiogram performed.  Jimmy Micca Matura RDCS, RVT  

## 2020-03-19 ENCOUNTER — Other Ambulatory Visit: Payer: Self-pay

## 2020-03-19 ENCOUNTER — Encounter: Payer: Self-pay | Admitting: Cardiology

## 2020-03-19 ENCOUNTER — Ambulatory Visit (INDEPENDENT_AMBULATORY_CARE_PROVIDER_SITE_OTHER): Payer: Medicare Other | Admitting: Cardiology

## 2020-03-19 VITALS — BP 128/84 | HR 80 | Ht 69.0 in | Wt 338.2 lb

## 2020-03-19 DIAGNOSIS — I493 Ventricular premature depolarization: Secondary | ICD-10-CM

## 2020-03-19 DIAGNOSIS — R079 Chest pain, unspecified: Secondary | ICD-10-CM

## 2020-03-19 DIAGNOSIS — I4729 Other ventricular tachycardia: Secondary | ICD-10-CM | POA: Insufficient documentation

## 2020-03-19 DIAGNOSIS — I472 Ventricular tachycardia: Secondary | ICD-10-CM

## 2020-03-19 DIAGNOSIS — R0789 Other chest pain: Secondary | ICD-10-CM | POA: Insufficient documentation

## 2020-03-19 DIAGNOSIS — R931 Abnormal findings on diagnostic imaging of heart and coronary circulation: Secondary | ICD-10-CM

## 2020-03-19 DIAGNOSIS — R600 Localized edema: Secondary | ICD-10-CM

## 2020-03-19 DIAGNOSIS — R9430 Abnormal result of cardiovascular function study, unspecified: Secondary | ICD-10-CM

## 2020-03-19 HISTORY — DX: Ventricular premature depolarization: I49.3

## 2020-03-19 HISTORY — DX: Other chest pain: R07.89

## 2020-03-19 HISTORY — DX: Ventricular tachycardia: I47.2

## 2020-03-19 HISTORY — DX: Other ventricular tachycardia: I47.29

## 2020-03-19 LAB — BASIC METABOLIC PANEL
BUN/Creatinine Ratio: 11 (ref 9–23)
BUN: 6 mg/dL (ref 6–24)
CO2: 27 mmol/L (ref 20–29)
Calcium: 9.2 mg/dL (ref 8.7–10.2)
Chloride: 101 mmol/L (ref 96–106)
Creatinine, Ser: 0.56 mg/dL — ABNORMAL LOW (ref 0.57–1.00)
GFR calc Af Amer: 134 mL/min/{1.73_m2} (ref >59)
GFR calc non Af Amer: 116 mL/min/{1.73_m2} (ref >59)
Glucose: 109 mg/dL — ABNORMAL HIGH (ref 65–99)
Potassium: 4.5 mmol/L (ref 3.5–5.2)
Sodium: 140 mmol/L (ref 134–144)

## 2020-03-19 LAB — MAGNESIUM: Magnesium: 2.1 mg/dL (ref 1.6–2.3)

## 2020-03-19 MED ORDER — METOPROLOL TARTRATE 100 MG PO TABS: 100.0000 mg | ORAL_TABLET | Freq: Two times a day (BID) | ORAL | 0 refills | Status: DC

## 2020-03-19 MED ORDER — DILTIAZEM HCL ER COATED BEADS 120 MG PO CP24: 120.0000 mg | ORAL_CAPSULE | Freq: Every day | ORAL | 2 refills | Status: DC

## 2020-03-19 NOTE — Patient Instructions (Addendum)
Medication Instructions:  Start Caridzem 120 mg daily *If you need a refill on your cardiac medications before your next appointment, please call your pharmacy*   Lab Work: BMET, Magnesium If you have labs (blood work) drawn today and your tests are completely normal, you will receive your results only by: Marland Kitchen MyChart Message (if you have MyChart) OR . A paper copy in the mail If you have any lab test that is abnormal or we need to change your treatment, we will call you to review the results.   Testing/Procedures: Your cardiac CT will be scheduled at the below location:   [facility] [address] [place], [place] [address] [phone]  If scheduled at [facility], please arrive at the [facility] main entrance of [facility] 30 minutes prior to test start time. Proceed to the [facility] Radiology Department (first floor) to check-in and test prep.  Please follow these instructions carefully (unless otherwise directed):  On the Night Before the Test: . Be sure to Drink plenty of water. . Do not consume any caffeinated/decaffeinated beverages or chocolate 12 hours prior to your test. . Do not take any antihistamines 12 hours prior to your test.  On the Day of the Test: . Drink plenty of water. Do not drink any water within one hour of the test. . Do not eat any food 4 hours prior to the test. . You may take your regular medications prior to the test.  . Take metoprolol (Lopressor) two hours prior to test. . HOLD Furosemide morning of the test. . FEMALES- please wear underwire-free bra if available       After the Test: . Drink plenty of water. . After receiving IV contrast, you may experience a mild flushed feeling. This is normal. . On occasion, you may experience a mild rash up to 24 hours after the test. This is not dangerous. If this occurs, you can take Benadryl 25 mg and increase your fluid intake. . If you experience trouble breathing,  this can be serious. If it is severe call 911 IMMEDIATELY. If it is mild, please call our office. . If you take any of these medications: Glipizide/Metformin, Avandament, Glucavance, please do not take 48 hours after completing test unless otherwise instructed.   Once we have confirmed authorization from your insurance company, we will call you to set up a date and time for your test. Based on how quickly your insurance processes prior authorizations requests, please allow up to 4 weeks to be contacted for scheduling your Cardiac CT appointment. Be advised that routine Cardiac CT appointments could be scheduled as many as 8 weeks after your provider has ordered it.  For non-scheduling related questions, please contact the cardiac imaging nurse navigator should you have any questions/concerns: Marchia Bond, Cardiac Imaging Nurse Navigator Burley Saver, Interim Cardiac Imaging Nurse [place] [facility] Direct Office Dial: [phone]   For scheduling needs, including cancellations and rescheduling, please call [place], [phone].     Follow-Up: At [facility], you and your health needs are our priority.  As part of our continuing mission to provide you with exceptional heart care, we have created designated Provider Care Teams.  These Care Teams include your primary Cardiologist (physician) and Advanced Practice Providers (APPs -  Physician Assistants and Nurse Practitioners) who all work together to provide you with the care you need, when you need it.  We recommend signing up for the patient portal called "  MyChart".  Sign up information is provided on this After Visit Summary.  MyChart is used to connect with patients for Virtual Visits (Telemedicine).  Patients are able to view lab/test results, encounter notes, upcoming appointments, etc.  Non-urgent messages can be sent to your provider as well.   To learn more about what you can do with MyChart, go to  [email or web address].    Your next appointment:   1 month(s)  The format for your next appointment:   In Person  Provider:   Berniece Salines, DO   Other Instructions Diltiazem Oral Tablets What is this medicine? DILTIAZEM (dil TYE a zem) is a calcium channel blocker. It relaxes your blood vessels and decreases the amount of work the heart has to do. It treats and/or prevents chest pain (also called angina). This medicine may be used for other purposes; ask your health care provider or pharmacist if you have questions. COMMON BRAND NAME(S): Cardizem What should I tell my health care provider before I take this medicine? They need to know if you have any of these conditions:  heart attack  heart disease  irregular heartbeat or rhythm  low blood pressure  an unusual or allergic reaction to diltiazem, other drugs, foods, dyes, or preservatives  pregnant or trying to get pregnant  breast-feeding How should I use this medicine? Take this drug by mouth. Take it as directed on the prescription label at the same time every day. Keep taking it unless your health care provider tells you to stop. Talk to your health care provider about the use of this drug in children. Special care may be needed. Overdosage: If you think you have taken too much of this medicine contact a poison control center or emergency room at once. NOTE: This medicine is only for you. Do not share this medicine with others. What if I miss a dose? If you miss a dose, take it as soon as you can. If it is almost time for your next dose, take only that dose. Do not take double or extra doses. What may interact with this medicine? Do not take this medicine with any of the following:  cisapride  hawthorn  pimozide  ranolazine  red yeast rice This medicine may also interact with the following medications:  buspirone  carbamazepine  cimetidine  cyclosporine  digoxin  local anesthetics or general  anesthetics  lovastatin  medicines for anxiety or difficulty sleeping like midazolam and triazolam  medicines for high blood pressure or heart problems  quinidine  rifampin, rifabutin, or rifapentine This list may not describe all possible interactions. Give your health care provider a list of all the medicines, herbs, non-prescription drugs, or dietary supplements you use. Also tell them if you smoke, drink alcohol, or use illegal drugs. Some items may interact with your medicine. What should I watch for while using this medicine? Visit your health care provider for regular checks on your progress. Check your blood pressure as directed. Ask your health care provider what your blood pressure should be. Also, find out when you should contact him or her. Do not treat yourself for coughs, colds, or pain while you are using this drug without asking your health care provider for advice. Some drugs may increase your blood pressure. This drug may cause serious skin reactions. They can happen weeks to months after starting the drug. Contact your health care provider right away if you notice fevers or flu-like symptoms with a rash. The rash may be red or purple  and then turn into blisters or peeling of the skin. Or, you might notice a red rash with swelling of the face, lips or lymph nodes in your neck or under your arms. You may get drowsy or dizzy. Do not drive, use machinery, or do anything that needs mental alertness until you know how this drug affects you. Do not stand up or sit up quickly, especially if you are an older patient. This reduces the risk of dizzy or fainting spells. What side effects may I notice from receiving this medicine? Side effects that you should report to your doctor or health care provider as soon as possible:  allergic reactions (skin rash, itching or hives; swelling of the face, lips, or tongue)  heart failure (trouble breathing; fast, irregular heartbeat; sudden weight  gain; swelling of the ankles, feet, hands; unusually weak or tired)  heartbeat rhythm changes (trouble breathing; chest pain; dizziness; fast, irregular heartbeat; feeling faint or lightheaded, falls)  liver injury (dark yellow or brown urine; general ill feeling or flu-like symptoms; loss of appetite, right upper belly pain; unusually weak or tired, yellowing of the eyes or skin)  low blood pressure (dizziness; feeling faint or lightheaded, falls; unusually weak or tired)  redness, blistering, peeling, or loosening of the skin, including inside the mouth Side effects that usually do not require medical attention (report to your doctor or health care provider if they continue or are bothersome):  changes in sex drive or performance  depressed mood  headache  sudden weight gain  nausea  sudden weight gain  trouble sleeping This list may not describe all possible side effects. Call your doctor for medical advice about side effects. You may report side effects to FDA at 1-800-FDA-1088. Where should I keep my medicine? Keep out of the reach of children and pets. Store at room temperature between 15 and 30 degrees C (59 and 86 degrees F). Protect from moisture. Keep the container tightly closed. Throw away any unused drug after the expiration date. NOTE: This sheet is a summary. It may not cover all possible information. If you have questions about this medicine, talk to your doctor, pharmacist, or health care provider.  2020 [employer]/Gold Standard (2018-12-07 18:13:24)

## 2020-03-19 NOTE — Progress Notes (Signed)
Cardiology Office Note:    Date:  03/19/2020   ID:  Ellen Shaw, DOB 1980-11-11, MRN [id number]  PCP:  Renaldo Reel, PA  Cardiologist:  Berniece Salines, DO  Electrophysiologist:  None   Referring MD: Renaldo Reel, PA   " I have been experiencing chest pain"   History of Present Illness:    Ellen Shaw is a 40 y.o. female with a hx of hypertension, morbid obesity is here today for follow-up visit.  Did see the patient on February 15, 2020 at that time discuss her palpitations and she had been started on Lasix for lab bilateral leg edema by her PCP.  At the conclusion of her last visit please monitor the patient get an echocardiogram.  She is here today to discuss these results as well.  She has been experiencing some chest discomfort.  She described as a midsternal chest discomfort which last for seconds and then resolved.  She does have some associated shortness of breath.  Nothing make it better or worse.  Past Medical History:  Diagnosis Date  . Alcohol abuse 02/21/2014  . Allergic rhinitis 01/31/2008   Qualifier: Diagnosis of  By: Lenn Cal (AAMA), Lindwood Coke of this note might be different from the original. Overview:  Qualifier: Diagnosis of  By: Lenn Cal Deborra Medina), Wynona Canes  . Anxiety 07/22/2016  . Arthritis 07/22/2016   Formatting of this note might be different from the original. Knees Formatting of this note might be different from the original. Overview:  Knees  . Asthma 01/31/2008   Qualifier: Diagnosis of  By: Lenn Cal (Deborra Medina), Lindwood Coke of this note might be different from the original. Overview:  Qualifier: Diagnosis of  By: Lenn Cal Deborra Medina), Wynona Canes  . ASTHMA 01/31/2008   Qualifier: Diagnosis of  By: Lenn Cal Deborra Medina), Wynona Canes    . Bell's palsy    age 71-14  . Body mass index (BMI) of 50.0 to 59.9 in adult ([place]) 01/06/2017  . Complex atypical endometrial hyperplasia 08/01/2013  . Constipation 06/28/2018  . DYSPNEA 01/31/2008   Qualifier: Diagnosis of   By: Elsworth Soho MD, Leanna Sato    . Episodic mood disorder ([place]) 02/04/2012  . Essential hypertension 02/03/2011  . Folliculitis 6/88/6484  . GERD (gastroesophageal reflux disease) 12/03/2012  . History of Helicobacter pylori infection 03/03/2017  . Hx of migraines 02/13/2020  . Hyperlipidemia   . Hypertension   . Intertrigo 09/01/2017  . Lumbosacral radiculopathy 09/01/2013  . Major depressive disorder, recurrent episode ([place]) 05/02/2014  . Medication overuse headache 07/27/2014  . Migraine with aura and without status migrainosus, not intractable 07/27/2014   Formatting of this note might be different from the original. Follows with Dr. Cristela Blue in Neurology. HX pseudotumor cerebri  . Mixed stress and urge urinary incontinence 08/12/2013  . Multiple allergies 02/13/2020  . Obstructive sleep apnea on CPAP 12/27/2010   Formatting of this note might be different from the original. Uses Cpap  Formatting of this note might be different from the original. AHI=21.8 AutoPAP at 15-20 with an EPR=3  Last Assessment & Plan:  Formatting of this note might be different from the original. Relevant Hx: Course: Daily Update: Today's Plan:  . 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 Patellofemoral pain syndrome 10/12/2012  . Personal history of other diseases of the nervous system and sense organs 03/18/1995  . Posttraumatic stress disorder 02/04/2012   Last Assessment & Plan:  Formatting of this note might be different from the original. Relevant  Hx: Course: Daily Update: Today's Plan:  Electronically signed by: Blenda Bridegroom, MD Resident 12/22/14 [phone]  . Prediabetes 06/30/2017   Formatting of this note might be different from the original. A1C 6.1 on 06/2017  . Primary osteoarthritis of right knee 10/12/2012  . Pseudotumor   . Right cervical radiculopathy 07/18/2014  . Sleep apnea   . SVT (supraventricular tachycardia) ([place]) 07/15/2016  . Tobacco use 12/03/2011  . Umbilical cyst 7/85/8850  . Urinary retention  08/19/2013   Formatting of this note might be different from the original. S/P interstim placement with Urology  . Vulvovaginal candidiasis 03/03/2017    Past Surgical History:  Procedure Laterality Date  . BLADDER SURGERY  2016  . TONSILLECTOMY AND ADENOIDECTOMY    . VAGINAL HYSTERECTOMY  2015    Current Medications: Current Meds  Medication Sig  . diltiazem (CARDIZEM CD) 120 MG 24 hr capsule Take 1 capsule (120 mg total) by mouth daily.  . metoprolol tartrate (LOPRESSOR) 100 MG tablet Take 1 tablet (100 mg total) by mouth 2 (two) times daily. Take two hours prior to your cardiac CT     Allergies:   Ciprofloxacin, Clindamycin, Esomeprazole magnesium, Nitrofurantoin, Oxycodone, Sulfa antibiotics, Sulfamethoxazole-trimethoprim, Trimethoprim, Bee pollen, Citrus, Doxycycline, Fluticasone, Penicillins, Pollen extract, Bee venom, Buspirone, Esomeprazole magnesium, Fentanyl, Flonase [fluticasone propionate], Fluticasone propionate, Lexapro [escitalopram], Paliperidone, Pantoprazole, and Orange oil   Social History   Socioeconomic History  . Marital status: Legally Separated    Spouse name: Not on file  . Number of children: Not on file  . Years of education: Not on file  . Highest education level: Not on file  Occupational History  . Not on file  Tobacco Use  . Smoking status: Former Smoker    Quit date: 01/19/2020    Years since quitting: 0.1  . Smokeless tobacco: Never Used  Substance and Sexual Activity  . Alcohol use: Not Currently  . Drug use: No  . Sexual activity: Not on file  Other Topics Concern  . Not on file  Social History Narrative  . Not on file   Social Determinants of Health   Financial Resource Strain: Not on file  Food Insecurity: Not on file  Transportation Needs: Not on file  Physical Activity: Not on file  Stress: Not on file  Social Connections: Not on file     Family History: The patient's family history includes Atrial fibrillation in her father;  COPD in her father; Congestive Heart Failure in her father; Glaucoma in her father; Heart attack in her maternal grandmother; High Cholesterol in her mother; Hypertension in her father; Kidney disease in her father; Lung cancer in her father and paternal grandmother; Macular degeneration in her father; Stroke in her father.  ROS:   Review of Systems  Constitution: Negative for decreased appetite, fever and weight gain.  HENT: Negative for congestion, ear discharge, hoarse voice and sore throat.   Eyes: Negative for discharge, redness, vision loss in right eye and visual halos.  Cardiovascular: Negative for chest pain, dyspnea on exertion, leg swelling, orthopnea and palpitations.  Respiratory: Negative for cough, hemoptysis, shortness of breath and snoring.   Endocrine: Negative for heat intolerance and polyphagia.  Hematologic/Lymphatic: Negative for bleeding problem. Does not bruise/bleed easily.  Skin: Negative for flushing, nail changes, rash and suspicious lesions.  Musculoskeletal: Negative for arthritis, joint pain, muscle cramps, myalgias, neck pain and stiffness.  Gastrointestinal: Negative for abdominal pain, bowel incontinence, diarrhea and excessive appetite.  Genitourinary: Negative for decreased libido, genital sores  and incomplete emptying.  Neurological: Negative for brief paralysis, focal weakness, headaches and loss of balance.  Psychiatric/Behavioral: Negative for altered mental status, depression and suicidal ideas.  Allergic/Immunologic: Negative for HIV exposure and persistent infections.    EKGs/Labs/Other Studies Reviewed:    The following studies were reviewed today:   EKG: None today  Zio monitor The patient wore the monitor for 14 days starting February 15, 2020   . Indication: Palpitations  The minimum heart rate was 47 bpm, maximum heart rate was 240 bpm, and average heart rate was 85 bpm. Predominant underlying rhythm was Sinus Rhythm.  First-degree AV  block was present.  9 Ventricular Tachycardia runs occurred, the run with the fastest interval lasting 5 beats with a maximum rate of 240 bpm, the longest lasting 5 beats with an average rate of 184 bpm.  Premature atrial complexes were rare less than 1%. Premature Ventricular complexes rare less than 1%.  No pauses, No AV block and no atrial fibrillation present. 14 patient triggered events and 13 diary events associated with premature ventricular complex.   Conclusion: This study is remarkable for the following:                             1.  Nonsustained ventricular tachycardia                             2.  Rare symptomatic premature ventricular complex.  Transthoracic Echocardiogram IMPRESSIONS  1. Left ventricular ejection fraction, by estimation, is 60 to 65%. The left ventricle has normal function. The left ventricle has no regional wall motion abnormalities. Left ventricular diastolic parameters were normal.  2. Right ventricular systolic function is normal. The right ventricular size is normal.  3. The mitral valve is normal in structure. No evidence of mitral valve regurgitation. No evidence of mitral stenosis.  4. The aortic valve is normal in structure. Aortic valve regurgitation is not visualized. No aortic stenosis is present.  5. The inferior vena cava is normal in size with greater than 50% respiratory variability, suggesting right atrial pressure of 3 mmHg.   FINDINGS  Left Ventricle: Left ventricular ejection fraction, by estimation, is 60 to 65%. The left ventricle has normal function. The left ventricle has no regional wall motion abnormalities. Definity contrast agent was given IV to delineate the left ventricular  endocardial borders. The left ventricular internal cavity size was normal in size. There is no left ventricular hypertrophy. Left ventricular diastolic parameters were normal.   Right Ventricle: The right ventricular size is normal. No increase  in right ventricular wall thickness. Right ventricular systolic function is  normal.   Left Atrium: Left atrial size was normal in size.   Right Atrium: Right atrial size was normal in size.   Pericardium: There is no evidence of pericardial effusion.   Mitral Valve: The mitral valve is normal in structure. No evidence of  mitral valve regurgitation. No evidence of mitral valve stenosis.   Tricuspid Valve: The tricuspid valve is normal in structure. Tricuspid  valve regurgitation is trivial. No evidence of tricuspid stenosis.   Aortic Valve: The aortic valve is normal in structure. Aortic valve  regurgitation is not visualized. No aortic stenosis is present.   Pulmonic Valve: The pulmonic valve was normal in structure. Pulmonic valve  regurgitation is not visualized. No evidence of pulmonic stenosis.   Aorta: The aortic root is normal  in size and structure.   Venous: The inferior vena cava is normal in size with greater than 50%  respiratory variability, suggesting right atrial pressure of 3 mmHg.   IAS/Shunts: No atrial level shunt detected by color flow Doppler.    Recent Labs: No results found for requested labs within last 8760 hours.  Recent Lipid Panel No results found for: CHOL, TRIG, HDL, CHOLHDL, VLDL, LDLCALC, LDLDIRECT  Physical Exam:    VS:  BP 128/84   Pulse 80   Ht '5\' 9"'  (1.753 m)   Wt (!) 338 lb 3.2 oz (153.4 kg)   SpO2 95%   BMI 49.94 kg/m     Wt Readings from Last 3 Encounters:  03/19/20 (!) 338 lb 3.2 oz (153.4 kg)  02/17/20 (!) 341 lb (154.7 kg)  02/15/20 (!) 340 lb (154.2 kg)     GEN: Well nourished, well developed in no acute distress HEENT: Normal NECK: No JVD; No carotid bruits LYMPHATICS: No lymphadenopathy CARDIAC: S1S2 noted,RRR, no murmurs, rubs, gallops RESPIRATORY:  Clear to auscultation without rales, wheezing or rhonchi  ABDOMEN: Soft, non-tender, non-distended, +bowel sounds, no guarding. EXTREMITIES: No edema, No cyanosis, no  clubbing MUSCULOSKELETAL:  No deformity  SKIN: Warm and dry NEUROLOGIC:  Alert and oriented x 3, non-focal PSYCHIATRIC:  Normal affect, good insight  ASSESSMENT:    1. NSVT (nonsustained ventricular tachycardia) (HCC)   2. Chest discomfort   3. PVC (premature ventricular contraction)   4. Chest pain, unspecified type   5. Abnormal result of cardiovascular function study, unspecified    6. Abnormal findings on diagnostic imaging of heart and coronary circulation    7. Bilateral leg edema    PLAN:     1.  We discussed her monitor findings.  She still does have palpitations been elected to start the patient with low-dose calcium channel blocker Cardizem 120 mg daily.  2.  In addition with her chest discomfort and shortness of breath I like to have the patient call cardiac CTA to understand if coronary disease is playing a role.  She does have risk factors for coronary artery disease as well. I discussed with the patient about the testing.  She is agreeable to proceed with this.    3.  She is still on her Lasix, her bilateral leg edema has improved and she also has had some weight loss since her last visit.  She will continue Lasix.  Blood work will be done today to assess kidney function and electrolytes.  4.  The patient understands the need to lose weight with diet and exercise. We have discussed specific strategies for this.   The patient is in agreement with the above plan. The patient left the office in stable condition.  The patient will follow up in 1 month due to medication change.   Medication Adjustments/Labs and Tests Ordered: Current medicines are reviewed at length with the patient today.  Concerns regarding medicines are outlined above.  Orders Placed This Encounter  Procedures  . CT CORONARY MORPH W/CTA COR W/SCORE W/CA W/CM &/OR WO/CM  . CT CORONARY FRACTIONAL FLOW RESERVE DATA PREP  . CT CORONARY FRACTIONAL FLOW RESERVE FLUID ANALYSIS  . Basic Metabolic Panel  (BMET)  . Magnesium   Meds ordered this encounter  Medications  . diltiazem (CARDIZEM CD) 120 MG 24 hr capsule    Sig: Take 1 capsule (120 mg total) by mouth daily.    Dispense:  30 capsule    Refill:  2  . metoprolol  tartrate (LOPRESSOR) 100 MG tablet    Sig: Take 1 tablet (100 mg total) by mouth 2 (two) times daily. Take two hours prior to your cardiac CT    Dispense:  1 tablet    Refill:  0    Patient Instructions  Medication Instructions:  Start Caridzem 120 mg daily *If you need a refill on your cardiac medications before your next appointment, please call your pharmacy*   Lab Work: BMET, Magnesium If you have labs (blood work) drawn today and your tests are completely normal, you will receive your results only by: Marland Kitchen MyChart Message (if you have MyChart) OR . A paper copy in the mail If you have any lab test that is abnormal or we need to change your treatment, we will call you to review the results.   Testing/Procedures: Your cardiac CT will be scheduled at the below location:   [facility] [address] [place], [place] [address] [phone]  If scheduled at [facility], please arrive at the [facility] main entrance of [facility] 30 minutes prior to test start time. Proceed to the [facility] Radiology Department (first floor) to check-in and test prep.  Please follow these instructions carefully (unless otherwise directed):  On the Night Before the Test: . Be sure to Drink plenty of water. . Do not consume any caffeinated/decaffeinated beverages or chocolate 12 hours prior to your test. . Do not take any antihistamines 12 hours prior to your test.  On the Day of the Test: . Drink plenty of water. Do not drink any water within one hour of the test. . Do not eat any food 4 hours prior to the test. . You may take your regular medications prior to the test.  . Take metoprolol (Lopressor) two hours prior to test. . HOLD  Furosemide morning of the test. . FEMALES- please wear underwire-free bra if available       After the Test: . Drink plenty of water. . After receiving IV contrast, you may experience a mild flushed feeling. This is normal. . On occasion, you may experience a mild rash up to 24 hours after the test. This is not dangerous. If this occurs, you can take Benadryl 25 mg and increase your fluid intake. . If you experience trouble breathing, this can be serious. If it is severe call 911 IMMEDIATELY. If it is mild, please call our office. . If you take any of these medications: Glipizide/Metformin, Avandament, Glucavance, please do not take 48 hours after completing test unless otherwise instructed.   Once we have confirmed authorization from your insurance company, we will call you to set up a date and time for your test. Based on how quickly your insurance processes prior authorizations requests, please allow up to 4 weeks to be contacted for scheduling your Cardiac CT appointment. Be advised that routine Cardiac CT appointments could be scheduled as many as 8 weeks after your provider has ordered it.  For non-scheduling related questions, please contact the cardiac imaging nurse navigator should you have any questions/concerns: Marchia Bond, Cardiac Imaging Nurse Navigator Burley Saver, Interim Cardiac Imaging Nurse [place] [facility] Direct Office Dial: [phone]   For scheduling needs, including cancellations and rescheduling, please call [place], [phone].     Follow-Up: At [facility], you and your health needs are our priority.  As part of our continuing mission to provide you with exceptional heart care, we have created designated Provider Care  Teams.  These Care Teams include your primary Cardiologist (physician) and Advanced Practice Providers (APPs -  Physician Assistants and Nurse Practitioners) who all work together to provide you with the care  you need, when you need it.  We recommend signing up for the patient portal called "MyChart".  Sign up information is provided on this After Visit Summary.  MyChart is used to connect with patients for Virtual Visits (Telemedicine).  Patients are able to view lab/test results, encounter notes, upcoming appointments, etc.  Non-urgent messages can be sent to your provider as well.   To learn more about what you can do with MyChart, go to [email or web address].    Your next appointment:   1 month(s)  The format for your next appointment:   In Person  Provider:   Berniece Salines, DO   Other Instructions Diltiazem Oral Tablets What is this medicine? DILTIAZEM (dil TYE a zem) is a calcium channel blocker. It relaxes your blood vessels and decreases the amount of work the heart has to do. It treats and/or prevents chest pain (also called angina). This medicine may be used for other purposes; ask your health care provider or pharmacist if you have questions. COMMON BRAND NAME(S): Cardizem What should I tell my health care provider before I take this medicine? They need to know if you have any of these conditions:  heart attack  heart disease  irregular heartbeat or rhythm  low blood pressure  an unusual or allergic reaction to diltiazem, other drugs, foods, dyes, or preservatives  pregnant or trying to get pregnant  breast-feeding How should I use this medicine? Take this drug by mouth. Take it as directed on the prescription label at the same time every day. Keep taking it unless your health care provider tells you to stop. Talk to your health care provider about the use of this drug in children. Special care may be needed. Overdosage: If you think you have taken too much of this medicine contact a poison control center or emergency room at once. NOTE: This medicine is only for you. Do not share this medicine with others. What if I miss a dose? If you miss a dose, take it as soon as  you can. If it is almost time for your next dose, take only that dose. Do not take double or extra doses. What may interact with this medicine? Do not take this medicine with any of the following:  cisapride  hawthorn  pimozide  ranolazine  red yeast rice This medicine may also interact with the following medications:  buspirone  carbamazepine  cimetidine  cyclosporine  digoxin  local anesthetics or general anesthetics  lovastatin  medicines for anxiety or difficulty sleeping like midazolam and triazolam  medicines for high blood pressure or heart problems  quinidine  rifampin, rifabutin, or rifapentine This list may not describe all possible interactions. Give your health care provider a list of all the medicines, herbs, non-prescription drugs, or dietary supplements you use. Also tell them if you smoke, drink alcohol, or use illegal drugs. Some items may interact with your medicine. What should I watch for while using this medicine? Visit your health care provider for regular checks on your progress. Check your blood pressure as directed. Ask your health care provider what your blood pressure should be. Also, find out when you should contact him or her. Do not treat yourself for coughs, colds, or pain while you are using this drug without asking your health care provider for advice.  Some drugs may increase your blood pressure. This drug may cause serious skin reactions. They can happen weeks to months after starting the drug. Contact your health care provider right away if you notice fevers or flu-like symptoms with a rash. The rash may be red or purple and then turn into blisters or peeling of the skin. Or, you might notice a red rash with swelling of the face, lips or lymph nodes in your neck or under your arms. You may get drowsy or dizzy. Do not drive, use machinery, or do anything that needs mental alertness until you know how this drug affects you. Do not stand up or  sit up quickly, especially if you are an older patient. This reduces the risk of dizzy or fainting spells. What side effects may I notice from receiving this medicine? Side effects that you should report to your doctor or health care provider as soon as possible:  allergic reactions (skin rash, itching or hives; swelling of the face, lips, or tongue)  heart failure (trouble breathing; fast, irregular heartbeat; sudden weight gain; swelling of the ankles, feet, hands; unusually weak or tired)  heartbeat rhythm changes (trouble breathing; chest pain; dizziness; fast, irregular heartbeat; feeling faint or lightheaded, falls)  liver injury (dark yellow or brown urine; general ill feeling or flu-like symptoms; loss of appetite, right upper belly pain; unusually weak or tired, yellowing of the eyes or skin)  low blood pressure (dizziness; feeling faint or lightheaded, falls; unusually weak or tired)  redness, blistering, peeling, or loosening of the skin, including inside the mouth Side effects that usually do not require medical attention (report to your doctor or health care provider if they continue or are bothersome):  changes in sex drive or performance  depressed mood  headache  sudden weight gain  nausea  sudden weight gain  trouble sleeping This list may not describe all possible side effects. Call your doctor for medical advice about side effects. You may report side effects to FDA at 1-800-FDA-1088. Where should I keep my medicine? Keep out of the reach of children and pets. Store at room temperature between 15 and 30 degrees C (59 and 86 degrees F). Protect from moisture. Keep the container tightly closed. Throw away any unused drug after the expiration date. NOTE: This sheet is a summary. It may not cover all possible information. If you have questions about this medicine, talk to your doctor, pharmacist, or health care provider.  2020 Elsevier/Gold Standard (2018-12-07  18:13:24)       Adopting a Healthy Lifestyle.  Know what a healthy weight is for you (roughly BMI <25) and aim to maintain this   Aim for 7+ servings of fruits and vegetables daily   65-80+ fluid ounces of water or unsweet tea for healthy kidneys   Limit to max 1 drink of alcohol per day; avoid smoking/tobacco   Limit animal fats in diet for cholesterol and heart health - choose grass fed whenever available   Avoid highly processed foods, and foods high in saturated/trans fats   Aim for low stress - take time to unwind and care for your mental health   Aim for 150 min of moderate intensity exercise weekly for heart health, and weights twice weekly for bone health   Aim for 7-9 hours of sleep daily   When it comes to diets, agreement about the perfect plan isnt easy to find, even among the experts. Experts at the [employer] developed an idea  known as the Healthy Eating Plate. Just imagine a plate divided into logical, healthy portions.   The emphasis is on diet quality:   Load up on vegetables and fruits - one-half of your plate: Aim for color and variety, and remember that potatoes dont count.   Go for whole grains - one-quarter of your plate: Whole wheat, barley, wheat berries, quinoa, oats, brown rice, and foods made with them. If you want pasta, go with whole wheat pasta.   Protein power - one-quarter of your plate: Fish, chicken, beans, and nuts are all healthy, versatile protein sources. Limit red meat.   The diet, however, does go beyond the plate, offering a few other suggestions.   Use healthy plant oils, such as olive, canola, soy, corn, sunflower and peanut. Check the labels, and avoid partially hydrogenated oil, which have unhealthy trans fats.   If youre thirsty, drink water. Coffee and tea are good in moderation, but skip sugary drinks and limit milk and dairy products to one or two daily servings.   The type of carbohydrate in the diet  is more important than the amount. Some sources of carbohydrates, such as vegetables, fruits, whole grains, and beans-are healthier than others.   Finally, stay active  Signed, Berniece Salines, DO  03/19/2020 9:06 AM    [place]

## 2020-03-20 ENCOUNTER — Telehealth: Payer: Self-pay

## 2020-03-20 NOTE — Telephone Encounter (Signed)
-----   Message from Thomasene Ripple, DO sent at 03/20/2020  9:11 AM EST ----- Labs normal, stable compared to 3 years ago

## 2020-03-20 NOTE — Telephone Encounter (Signed)
Spoke with patient regarding results and recommendation.  Patient verbalizes understanding and is agreeable to plan of care. Advised patient to call back with any issues or concerns.  

## 2020-03-21 ENCOUNTER — Telehealth: Payer: Self-pay | Admitting: Cardiology

## 2020-03-21 NOTE — Telephone Encounter (Signed)
Patient is calling stating that she wants to start walking as exercise and wants to know if Dr. Servando Salina recommends this. Please advise.

## 2020-03-21 NOTE — Telephone Encounter (Signed)
She is fine to exercise 

## 2020-03-21 NOTE — Telephone Encounter (Signed)
Spoke to the patient just now and let her know Dr. Tobb's recommendations. She verbalizes understanding and thanks me for the call back.  

## 2020-03-22 ENCOUNTER — Ambulatory Visit: Payer: Medicare Other | Admitting: Cardiology

## 2020-04-06 ENCOUNTER — Telehealth (HOSPITAL_COMMUNITY): Payer: Self-pay | Admitting: *Deleted

## 2020-04-06 NOTE — Telephone Encounter (Signed)
Reaching out to patient to offer assistance regarding upcoming cardiac imaging study; pt verbalizes understanding of appt date/time, parking situation and where to check in, pre-test NPO status and medications ordered, and verified current allergies; name and call back number provided for further questions should they arise  Daniesha Driver RN Navigator Cardiac Imaging [facility] [phone] office [phone] cell  

## 2020-04-09 ENCOUNTER — Encounter (HOSPITAL_COMMUNITY): Payer: Self-pay

## 2020-04-09 ENCOUNTER — Ambulatory Visit (HOSPITAL_COMMUNITY)
Admission: RE | Admit: 2020-04-09 | Discharge: 2020-04-09 | Disposition: A | Discharge status: Home / Self Care | Payer: Medicare Other | Source: Ambulatory Visit | Attending: Cardiology | Admitting: Cardiology

## 2020-04-09 ENCOUNTER — Encounter: Payer: Medicare Other | Admitting: *Deleted

## 2020-04-09 ENCOUNTER — Other Ambulatory Visit: Payer: Self-pay

## 2020-04-09 DIAGNOSIS — Z006 Encounter for examination for normal comparison and control in clinical research program: Secondary | ICD-10-CM

## 2020-04-09 DIAGNOSIS — R9430 Abnormal result of cardiovascular function study, unspecified: Secondary | ICD-10-CM | POA: Insufficient documentation

## 2020-04-09 DIAGNOSIS — R079 Chest pain, unspecified: Secondary | ICD-10-CM | POA: Diagnosis not present

## 2020-04-09 MED ORDER — NITROGLYCERIN 0.4 MG SL SUBL
0.8000 mg | SUBLINGUAL_TABLET | Freq: Once | SUBLINGUAL | Status: AC
  Administered 2020-04-09: 0.8 mg via SUBLINGUAL

## 2020-04-09 MED ORDER — NITROGLYCERIN 0.4 MG SL SUBL
SUBLINGUAL_TABLET | SUBLINGUAL | Status: AC
  Filled 2020-04-09: qty 2

## 2020-04-09 MED ORDER — IOHEXOL 350 MG/ML SOLN
100.0000 mL | Freq: Once | INTRAVENOUS | Status: AC | PRN
  Administered 2020-04-09: 100 mL via INTRAVENOUS

## 2020-04-09 NOTE — Research (Signed)
Subject Name: Ellen Shaw  Subject met inclusion and exclusion criteria.  The informed consent form, study requirements and expectations were reviewed with the subject and questions and concerns were addressed prior to the signing of the consent form.  The subject verbalized understanding of the trial requirements.  The subject agreed to participate in the Identify trial and signed the informed consent at [place] on 04/09/20  The informed consent was obtained prior to performance of any protocol-specific procedures for the subject.  A copy of the signed informed consent was given to the subject and a copy was placed in the subject's medical record.   [name] [name]

## 2020-04-10 ENCOUNTER — Other Ambulatory Visit: Payer: Self-pay

## 2020-04-10 ENCOUNTER — Telehealth: Payer: Self-pay

## 2020-04-10 DIAGNOSIS — G510 Bell's palsy: Secondary | ICD-10-CM | POA: Insufficient documentation

## 2020-04-10 DIAGNOSIS — G932 Benign intracranial hypertension: Secondary | ICD-10-CM | POA: Insufficient documentation

## 2020-04-10 NOTE — Telephone Encounter (Signed)
Patient is returning call to discuss her results. 

## 2020-04-10 NOTE — Telephone Encounter (Signed)
Left message on patients voicemail to please return our call.   

## 2020-04-10 NOTE — Telephone Encounter (Signed)
-----   Message from Ellen Ripple, DO sent at 04/10/2020  1:51 PM EST ----- Please let the patient know that her coronary CTA showed 0 calcium with no evidence of coronary artery disease this is great news.  Her noncardiac portion did not show any abnormalities.  I requested for the patient to see me earlier than February 9 to discuss her monitor results I would love to discuss with her in person about her monitor results thank you.

## 2020-04-10 NOTE — Telephone Encounter (Signed)
Spoke with patient regarding results and recommendation.  Patient verbalizes understanding and is agreeable to plan of care. Advised patient to call back with any issues or concerns.  

## 2020-04-11 ENCOUNTER — Encounter: Payer: Self-pay | Admitting: Cardiology

## 2020-04-11 ENCOUNTER — Other Ambulatory Visit: Payer: Self-pay

## 2020-04-11 ENCOUNTER — Ambulatory Visit (INDEPENDENT_AMBULATORY_CARE_PROVIDER_SITE_OTHER): Payer: Medicare Other | Admitting: Cardiology

## 2020-04-11 DIAGNOSIS — I472 Ventricular tachycardia: Secondary | ICD-10-CM | POA: Diagnosis not present

## 2020-04-11 DIAGNOSIS — I10 Essential (primary) hypertension: Secondary | ICD-10-CM

## 2020-04-11 DIAGNOSIS — I4729 Other ventricular tachycardia: Secondary | ICD-10-CM

## 2020-04-11 MED ORDER — PROPRANOLOL HCL 20 MG PO TABS: 20.0000 mg | ORAL_TABLET | Freq: Every evening | ORAL | 3 refills | Status: DC

## 2020-04-11 MED ORDER — FLECAINIDE ACETATE 50 MG PO TABS: 50.0000 mg | ORAL_TABLET | Freq: Two times a day (BID) | ORAL | 3 refills | Status: DC

## 2020-04-11 NOTE — Patient Instructions (Signed)
Medication Instructions:  Your physician has recommended you make the following change in your medication:   STOP: Diltiazem START: Flecainide START: Propranolol  *If you need a refill on your cardiac medications before your next appointment, please call your pharmacy*   Lab Work: None If you have labs (blood work) drawn today and your tests are completely normal, you will receive your results only by: Marland Kitchen MyChart Message (if you have MyChart) OR . A paper copy in the mail If you have any lab test that is abnormal or we need to change your treatment, we will call you to review the results.   Testing/Procedures: None   Follow-Up: At [facility], you and your health needs are our priority.  As part of our continuing mission to provide you with exceptional heart care, we have created designated Provider Care Teams.  These Care Teams include your primary Cardiologist (physician) and Advanced Practice Providers (APPs -  Physician Assistants and Nurse Practitioners) who all work together to provide you with the care you need, when you need it.  We recommend signing up for the patient portal called "MyChart".  Sign up information is provided on this After Visit Summary.  MyChart is used to connect with patients for Virtual Visits (Telemedicine).  Patients are able to view lab/test results, encounter notes, upcoming appointments, etc.  Non-urgent messages can be sent to your provider as well.   To learn more about what you can do with MyChart, go to [email or web address].    Your next appointment:   4 week(s)  The format for your next appointment:   In Person  Provider:   Thomasene Ripple, DO   Other Instructions

## 2020-04-11 NOTE — Progress Notes (Signed)
Cardiology Office Note:    Date:  04/11/2020   ID:  Ellen Shaw, DOB [birth year], MRN [id number]  PCP:  Marlyn Corporal, PA  Cardiologist:  Thomasene Ripple, DO  Electrophysiologist:  None   Referring MD: Marlyn Corporal, PA   " I am still experiencing palpitations and some dizziness."  History of Present Illness:    Ellen Shaw is a 40 y.o. female with a hx of hypertension, morbid obesity, nonsustained ventricular tachycardia which was seen ZIO monitor.  When I last saw the patient 1 March 20, 2019 at that time she had some chest pain and based on her I did have some multiple episodes of nonsustained ventricular tachycardia I recommended she undergo a coronary CTA to rule out coronary artery disease.  She was able to get her CTA done which did not show any evidence of coronary artery disease 0 calcium.  She continues to have palpitations and feels like the Cardizem has not helped at all.  No other complaints at this time.  Past Medical History:  Diagnosis Date  . Alcohol abuse 02/21/2014  . Allergic rhinitis 01/31/2008   Qualifier: Diagnosis of  By: Truman Hayward (AAMA), Epifanio Lesches of this note might be different from the original. Overview:  Qualifier: Diagnosis of  By: Truman Hayward Duncan Dull), Lynnae Sandhoff  . Anxiety 07/22/2016  . Arthritis 07/22/2016   Formatting of this note might be different from the original. Knees Formatting of this note might be different from the original. Overview:  Knees  . Asthma 01/31/2008   Qualifier: Diagnosis of  By: Truman Hayward (Duncan Dull), Epifanio Lesches of this note might be different from the original. Overview:  Qualifier: Diagnosis of  By: Truman Hayward Duncan Dull), Lynnae Sandhoff  . ASTHMA 01/31/2008   Qualifier: Diagnosis of  By: Truman Hayward Duncan Dull), Lynnae Sandhoff    . Bell's palsy    age 96-14  . Body mass index (BMI) of 50.0 to 59.9 in adult (HCC) 01/06/2017  . Complex atypical endometrial hyperplasia 08/01/2013  . Constipation 06/28/2018  . DYSPNEA 01/31/2008    Qualifier: Diagnosis of  By: Vassie Loll MD, Comer Locket    . Episodic mood disorder (HCC) 02/04/2012  . Essential hypertension 02/03/2011  . Folliculitis 10/12/2017  . GERD (gastroesophageal reflux disease) 12/03/2012  . History of Helicobacter pylori infection 03/03/2017  . Hx of migraines 02/13/2020  . Hyperlipidemia   . Hypertension   . Intertrigo 09/01/2017  . Lumbosacral radiculopathy 09/01/2013  . Major depressive disorder, recurrent episode (HCC) 05/02/2014  . Medication overuse headache 07/27/2014  . Migraine with aura and without status migrainosus, not intractable 07/27/2014   Formatting of this note might be different from the original. Follows with Dr. Weston Settle in Neurology. HX pseudotumor cerebri  . Mixed stress and urge urinary incontinence 08/12/2013  . Multiple allergies 02/13/2020  . Obstructive sleep apnea on CPAP 12/27/2010   Formatting of this note might be different from the original. Uses Cpap  Formatting of this note might be different from the original. AHI=21.8 AutoPAP at 15-20 with an EPR=3  Last Assessment & Plan:  Formatting of this note might be different from the original. Relevant Hx: Course: Daily Update: Today's Plan:  . 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 Patellofemoral pain syndrome 10/12/2012  . Personal history of other diseases of the nervous system and sense organs 03/18/1995  . Posttraumatic stress disorder 02/04/2012   Last Assessment & Plan:  Formatting of this note might be different from the original. Relevant Hx: Course:  Daily Update: Today's Plan:  Electronically signed by: Seward Speck, MD Resident 12/22/14 [phone]  . Prediabetes 06/30/2017   Formatting of this note might be different from the original. A1C 6.1 on 06/2017  . Primary osteoarthritis of right knee 10/12/2012  . Pseudotumor   . Right cervical radiculopathy 07/18/2014  . Sleep apnea   . SVT (supraventricular tachycardia) (HCC) 07/15/2016  . Tobacco use 12/03/2011  . Umbilical cyst 06/02/2011   . Urinary retention 08/19/2013   Formatting of this note might be different from the original. S/P interstim placement with Urology  . Vulvovaginal candidiasis 03/03/2017    Past Surgical History:  Procedure Laterality Date  . BLADDER SURGERY    . TONSILLECTOMY AND ADENOIDECTOMY    . VAGINAL HYSTERECTOMY  2015    Current Medications: Current Meds  Medication Sig  . albuterol (VENTOLIN HFA) 108 (90 Base) MCG/ACT inhaler every 4 (four) hours as needed. Every 4-6 hours as needed  . Cholecalciferol (VITAMIN D3) 1.25 MG (50000 UT) CAPS Take 1 capsule by mouth once a week.  . diazepam (VALIUM) 5 MG tablet Take 5 mg by mouth once.  Marland Kitchen DILT-XR 120 MG 24 hr capsule Take 120 mg by mouth daily.  Marland Kitchen EPINEPHrine 0.3 mg/0.3 mL IJ SOAJ injection Inject into the muscle.  . ferrous sulfate 325 (65 FE) MG tablet Take 325 mg by mouth daily with breakfast.  . flecainide (TAMBOCOR) 50 MG tablet Take 1 tablet (50 mg total) by mouth 2 (two) times daily.  . furosemide (LASIX) 20 MG tablet Take 1 tablet (20 mg total) by mouth 3 (three) times a week.  . linaclotide (LINZESS) 290 MCG CAPS capsule Take 290 mcg by mouth daily before breakfast.  . loratadine (CLARITIN) 10 MG tablet Take 10 mg by mouth daily.  . Multiple Vitamin (MULTI-VITAMIN) tablet   . ondansetron (ZOFRAN) 4 MG tablet Take 4 mg by mouth every 8 (eight) hours as needed for nausea or vomiting.  . potassium chloride SA (KLOR-CON) 20 MEQ tablet Take 1 tablet (20 mEq total) by mouth 3 (three) times a week.  . promethazine (PHENERGAN) 12.5 MG tablet   . propranolol (INDERAL) 20 MG tablet Take 1 tablet (20 mg total) by mouth at bedtime.  Marland Kitchen RISPERDAL CONSTA 25 MG injection Inject into the muscle every 14 (fourteen) days.   Marland Kitchen sertraline (ZOLOFT) 100 MG tablet Take 100 mg by mouth daily.  . traZODone (DESYREL) 50 MG tablet   . [DISCONTINUED] diltiazem (CARDIZEM CD) 120 MG 24 hr capsule Take 1 capsule (120 mg total) by mouth daily.  . [DISCONTINUED]  metoprolol tartrate (LOPRESSOR) 100 MG tablet Take 1 tablet (100 mg total) by mouth 2 (two) times daily. Take two hours prior to your cardiac CT     Allergies:   Ciprofloxacin, Clindamycin, Esomeprazole magnesium, Nitrofurantoin, Oxycodone, Sulfa antibiotics, Sulfamethoxazole-trimethoprim, Trimethoprim, Bee pollen, Citrus, Doxycycline, Fluticasone, Penicillins, Pollen extract, Bee venom, Buspirone, Esomeprazole magnesium, Fentanyl, Flonase [fluticasone propionate], Fluticasone propionate, Lexapro [escitalopram], Paliperidone, Pantoprazole, and Orange oil   Social History   Socioeconomic History  . Marital status: Legally Separated    Spouse name: Not on file  . Number of children: Not on file  . Years of education: Not on file  . Highest education level: Not on file  Occupational History  . Not on file  Tobacco Use  . Smoking status: Former Smoker    Quit date: 01/19/2020    Years since quitting: 0.2  . Smokeless tobacco: Never Used  Substance and Sexual Activity  .  Alcohol use: Not Currently  . Drug use: No  . Sexual activity: Not on file  Other Topics Concern  . Not on file  Social History Narrative  . Not on file   Social Determinants of Health   Financial Resource Strain: Not on file  Food Insecurity: Not on file  Transportation Needs: Not on file  Physical Activity: Not on file  Stress: Not on file  Social Connections: Not on file     Family History: The patient's family history includes Atrial fibrillation in her father; COPD in her father; Congestive Heart Failure in her father; Glaucoma in her father; Heart attack in her maternal grandmother; High Cholesterol in her mother; Hypertension in her father; Kidney disease in her father; Lung cancer in her father and paternal grandmother; Macular degeneration in her father; Stroke in her father.  ROS:   Review of Systems  Constitution: Negative for decreased appetite, fever and weight gain.  HENT: Negative for congestion,  ear discharge, hoarse voice and sore throat.   Eyes: Negative for discharge, redness, vision loss in right eye and visual halos.  Cardiovascular: Negative for chest pain, dyspnea on exertion, leg swelling, orthopnea and palpitations.  Respiratory: Negative for cough, hemoptysis, shortness of breath and snoring.   Endocrine: Negative for heat intolerance and polyphagia.  Hematologic/Lymphatic: Negative for bleeding problem. Does not bruise/bleed easily.  Skin: Negative for flushing, nail changes, rash and suspicious lesions.  Musculoskeletal: Negative for arthritis, joint pain, muscle cramps, myalgias, neck pain and stiffness.  Gastrointestinal: Negative for abdominal pain, bowel incontinence, diarrhea and excessive appetite.  Genitourinary: Negative for decreased libido, genital sores and incomplete emptying.  Neurological: Negative for brief paralysis, focal weakness, headaches and loss of balance.  Psychiatric/Behavioral: Negative for altered mental status, depression and suicidal ideas.  Allergic/Immunologic: Negative for HIV exposure and persistent infections.    EKGs/Labs/Other Studies Reviewed:    The following studies were reviewed today:   EKG: None today  CCTA  Aorta: Normal size.  No calcifications.  No dissection.  Aortic Valve:  Trileaflet.  No calcifications.  Coronary Arteries:  Normal coronary origin.  Right dominance.  RCA is a large dominant artery that gives rise to PDA and PLVB. There is no plaque.  Left main is a large artery that gives rise to LAD and LCX arteries.  LAD is a large vessel that has no plaque.  LCX is a non-dominant artery that gives rise to one large OM1 branch. There is no plaque.  Other findings:  Normal pulmonary vein drainage into the left atrium.  Normal left atrial appendage without a thrombus.  Normal size of the pulmonary artery.  IMPRESSION: 1. Coronary calcium score of 0. This was 0 percentile for age and sex  matched control.  2. Normal coronary origin with right dominance.  3. No evidence of CAD.   Zio monitor The patient wore the monitor for 14 days starting February 15, 2020 . Indication: Palpitations  The minimum heart rate was 47 bpm, maximum heart rate was 240 bpm, and average heart rate was 85 bpm. Predominant underlying rhythm was Sinus Rhythm. First-degree AV block was present.  9 Ventricular Tachycardia runs occurred, the run with the fastest interval lasting 5 beats with a maximum rate of 240 bpm, the longest lasting 5 beats with an average rate of 184 bpm.  Premature atrial complexes were rare less than 1%. Premature Ventricular complexes rare less than 1%.  No pauses, No AV block and no atrial fibrillation present. 14 patient triggered  events and 13 diary events associated with premature ventricular complex.   Conclusion: This study is remarkable for the following: 1. Nonsustained ventricular tachycardia 2. Rare symptomatic premature ventricular complex.  Transthoracic Echocardiogram IMPRESSIONS  1. Left ventricular ejection fraction, by estimation, is 60 to 65%. The left ventricle has normal function. The left ventricle has no regional wall motion abnormalities. Left ventricular diastolic parameters were normal.  2. Right ventricular systolic function is normal. The right ventricular size is normal.  3. The mitral valve is normal in structure. No evidence of mitral valve regurgitation. No evidence of mitral stenosis.  4. The aortic valve is normal in structure. Aortic valve regurgitation is not visualized. No aortic stenosis is present.  5. The inferior vena cava is normal in size with greater than 50% respiratory variability, suggesting right atrial pressure of 3 mmHg.   FINDINGS  Left Ventricle: Left ventricular ejection fraction, by estimation, is 60 to 65%. The left ventricle has normal function.  The left ventricle has no regional wall motion abnormalities. Definity contrast agent was given IV to delineate the left ventricular  endocardial borders. The left ventricular internal cavity size was normal in size. There is no left ventricular hypertrophy. Left ventricular diastolic parameters were normal.   Right Ventricle: The right ventricular size is normal. No increase in right ventricular wall thickness. Right ventricular systolic function is  normal.   Left Atrium: Left atrial size was normal in size.   Right Atrium: Right atrial size was normal in size.   Pericardium: There is no evidence of pericardial effusion.   Mitral Valve: The mitral valve is normal in structure. No evidence of  mitral valve regurgitation. No evidence of mitral valve stenosis.   Tricuspid Valve: The tricuspid valve is normal in structure. Tricuspid  valve regurgitation is trivial. No evidence of tricuspid stenosis.   Aortic Valve: The aortic valve is normal in structure. Aortic valve  regurgitation is not visualized. No aortic stenosis is present.   Pulmonic Valve: The pulmonic valve was normal in structure. Pulmonic valve  regurgitation is not visualized. No evidence of pulmonic stenosis.   Aorta: The aortic root is normal in size and structure.   Venous: The inferior vena cava is normal in size with greater than 50%  respiratory variability, suggesting right atrial pressure of 3 mmHg.   IAS/Shunts: No atrial level shunt detected by color flow Doppler.    Recent Labs: 03/19/2020: BUN 6; Creatinine, Ser 0.56; Magnesium 2.1; Potassium 4.5; Sodium 140  Recent Lipid Panel No results found for: CHOL, TRIG, HDL, CHOLHDL, VLDL, LDLCALC, LDLDIRECT  Physical Exam:    VS:  BP 124/72   Pulse 82   Ht 5\' 9"  (1.753 m)   Wt (!) 332 lb 6.4 oz (150.8 kg)   SpO2 97%   BMI 49.09 kg/m     Wt Readings from Last 3 Encounters:  04/11/20 (!) 332 lb 6.4 oz (150.8 kg)  03/19/20 (!) 338 lb 3.2 oz (153.4 kg)   02/17/20 (!) 341 lb (154.7 kg)     GEN: Well nourished, well developed in no acute distress HEENT: Normal NECK: No JVD; No carotid bruits LYMPHATICS: No lymphadenopathy CARDIAC: S1S2 noted,RRR, no murmurs, rubs, gallops RESPIRATORY:  Clear to auscultation without rales, wheezing or rhonchi  ABDOMEN: Soft, non-tender, non-distended, +bowel sounds, no guarding. EXTREMITIES: No edema, No cyanosis, no clubbing MUSCULOSKELETAL:  No deformity  SKIN: Warm and dry NEUROLOGIC:  Alert and oriented x 3, non-focal PSYCHIATRIC:  Normal affect, good insight  ASSESSMENT:  1. Morbid obesity (HCC)   2. Essential hypertension   3. NSVT (nonsustained ventricular tachycardia) (HCC)    PLAN:     She still is experiencing symptoms of palpitation on Cardizem I like to stop the Cardizem place the patient on propranolol 20 mg at nighttime and start flecainide 50 mg twice a day.  I am hoping that this will help with her NSVT.  We discussed her CTA report she has no questions at this time.  The patient understands the need to lose weight with diet and exercise. We have discussed specific strategies for this.  She actually has lost some weight since the last time I saw the patient.  She is interested in our weight management programs okay to refer her to our ambulatory weight management/healthy weight and wellness program.  Continue her Lasix as well as her potassium supplement.  The patient is in agreement with the above plan. The patient left the office in stable condition.  The patient will follow up in 4 weeks due to medication change.   Medication Adjustments/Labs and Tests Ordered: Current medicines are reviewed at length with the patient today.  Concerns regarding medicines are outlined above.  Orders Placed This Encounter  Procedures  . Amb ref to Medical Nutrition Therapy-MNT   Meds ordered this encounter  Medications  . propranolol (INDERAL) 20 MG tablet    Sig: Take 1 tablet (20 mg  total) by mouth at bedtime.    Dispense:  30 tablet    Refill:  3  . flecainide (TAMBOCOR) 50 MG tablet    Sig: Take 1 tablet (50 mg total) by mouth 2 (two) times daily.    Dispense:  90 tablet    Refill:  3    Patient Instructions  Medication Instructions:  Your physician has recommended you make the following change in your medication:   STOP: Diltiazem START: Flecainide START: Propranolol  *If you need a refill on your cardiac medications before your next appointment, please call your pharmacy*   Lab Work: None If you have labs (blood work) drawn today and your tests are completely normal, you will receive your results only by: Marland Kitchen MyChart Message (if you have MyChart) OR . A paper copy in the mail If you have any lab test that is abnormal or we need to change your treatment, we will call you to review the results.   Testing/Procedures: None   Follow-Up: At [facility], you and your health needs are our priority.  As part of our continuing mission to provide you with exceptional heart care, we have created designated Provider Care Teams.  These Care Teams include your primary Cardiologist (physician) and Advanced Practice Providers (APPs -  Physician Assistants and Nurse Practitioners) who all work together to provide you with the care you need, when you need it.  We recommend signing up for the patient portal called "MyChart".  Sign up information is provided on this After Visit Summary.  MyChart is used to connect with patients for Virtual Visits (Telemedicine).  Patients are able to view lab/test results, encounter notes, upcoming appointments, etc.  Non-urgent messages can be sent to your provider as well.   To learn more about what you can do with MyChart, go to [email or web address].    Your next appointment:   4 week(s)  The format for your next appointment:   In Person  Provider:   Thomasene Ripple, DO   Other Instructions      Adopting a Healthy  Lifestyle.  Know what a healthy weight is for you (roughly BMI <25) and aim to maintain this   Aim for 7+ servings of fruits and vegetables daily   65-80+ fluid ounces of water or unsweet tea for healthy kidneys   Limit to max 1 drink of alcohol per day; avoid smoking/tobacco   Limit animal fats in diet for cholesterol and heart health - choose grass fed whenever available   Avoid highly processed foods, and foods high in saturated/trans fats   Aim for low stress - take time to unwind and care for your mental health   Aim for 150 min of moderate intensity exercise weekly for heart health, and weights twice weekly for bone health   Aim for 7-9 hours of sleep daily   When it comes to diets, agreement about the perfect plan isnt easy to find, even among the experts. Experts at the [facility] [employer] developed an idea known as the Healthy Eating Plate. Just imagine a plate divided into logical, healthy portions.   The emphasis is on diet quality:   Load up on vegetables and fruits - one-half of your plate: Aim for color and variety, and remember that potatoes dont count.   Go for whole grains - one-quarter of your plate: Whole wheat, barley, wheat berries, quinoa, oats, brown rice, and foods made with them. If you want pasta, go with whole wheat pasta.   Protein power - one-quarter of your plate: Fish, chicken, beans, and nuts are all healthy, versatile protein sources. Limit red meat.   The diet, however, does go beyond the plate, offering a few other suggestions.   Use healthy plant oils, such as olive, canola, soy, corn, sunflower and peanut. Check the labels, and avoid partially hydrogenated oil, which have unhealthy trans fats.   If youre thirsty, drink water. Coffee and tea are good in moderation, but skip sugary drinks and limit milk and dairy products to one or two daily servings.   The type of carbohydrate in the diet is more important than the amount. Some  sources of carbohydrates, such as vegetables, fruits, whole grains, and beans-are healthier than others.   Finally, stay active  Signed, Thomasene Ripple, DO  04/11/2020 4:56 PM    [facility]

## 2020-04-12 NOTE — Telephone Encounter (Signed)
Appointment made for 04/17/20 at 2:40

## 2020-04-17 ENCOUNTER — Encounter: Payer: Self-pay | Admitting: Cardiology

## 2020-04-17 ENCOUNTER — Ambulatory Visit: Payer: Medicare Other | Admitting: Cardiology

## 2020-04-17 ENCOUNTER — Ambulatory Visit (INDEPENDENT_AMBULATORY_CARE_PROVIDER_SITE_OTHER): Payer: Medicare Other | Admitting: Cardiology

## 2020-04-17 ENCOUNTER — Other Ambulatory Visit: Payer: Self-pay

## 2020-04-17 VITALS — BP 108/68 | HR 81 | Ht 69.0 in | Wt 330.6 lb

## 2020-04-17 DIAGNOSIS — I471 Supraventricular tachycardia: Secondary | ICD-10-CM | POA: Diagnosis not present

## 2020-04-17 DIAGNOSIS — I493 Ventricular premature depolarization: Secondary | ICD-10-CM | POA: Diagnosis not present

## 2020-04-17 DIAGNOSIS — I472 Ventricular tachycardia: Secondary | ICD-10-CM

## 2020-04-17 DIAGNOSIS — I4729 Other ventricular tachycardia: Secondary | ICD-10-CM

## 2020-04-17 DIAGNOSIS — I10 Essential (primary) hypertension: Secondary | ICD-10-CM

## 2020-04-17 MED ORDER — PROPRANOLOL HCL 20 MG PO TABS: 20.0000 mg | ORAL_TABLET | Freq: Two times a day (BID) | ORAL | 3 refills | Status: DC

## 2020-04-17 NOTE — Patient Instructions (Signed)
Medication Instructions:  Your physician has recommended you make the following change in your medication: START: Propranolol 20 mg twice daily  *If you need a refill on your cardiac medications before your next appointment, please call your pharmacy*   Lab Work: None If you have labs (blood work) drawn today and your tests are completely normal, you will receive your results only by: Marland Kitchen MyChart Message (if you have MyChart) OR . A paper copy in the mail If you have any lab test that is abnormal or we need to change your treatment, we will call you to review the results.   Testing/Procedures: None   Follow-Up: At [facility], you and your health needs are our priority.  As part of our continuing mission to provide you with exceptional heart care, we have created designated Provider Care Teams.  These Care Teams include your primary Cardiologist (physician) and Advanced Practice Providers (APPs -  Physician Assistants and Nurse Practitioners) who all work together to provide you with the care you need, when you need it.  We recommend signing up for the patient portal called "MyChart".  Sign up information is provided on this After Visit Summary.  MyChart is used to connect with patients for Virtual Visits (Telemedicine).  Patients are able to view lab/test results, encounter notes, upcoming appointments, etc.  Non-urgent messages can be sent to your provider as well.   To learn more about what you can do with MyChart, go to [email or web address].    Your next appointment:   05/11/2020  The format for your next appointment:   In Person  Provider:   Thomasene Ripple, DO   Other Instructions

## 2020-04-17 NOTE — Progress Notes (Signed)
Cardiology Office Note:    Date:  04/17/2020   ID:  Ellen Shaw, DOB 1981/01/19, MRN [id number]  PCP:  Marlyn Corporal, PA  Cardiologist:  Thomasene Ripple, DO  Electrophysiologist:  None   Referring MD: Marlyn Corporal, PA   I am feeling little better  History of Present Illness:    Ellen Shaw is a 40 y.o. female with a hx of hypertension, morbid obesity, nonsustained ventricular tachycardia which was seen ZIO monitor.  When I last saw the patient 1 March 20, 2019 at that time she had some chest pain and based on her I did have some multiple episodes of nonsustained ventricular tachycardia I recommended she undergo a coronary CTA to rule out coronary artery disease.  I saw the patient on April 12, 2019 we discussed her CT results which showed 0 calcium with no evidence of coronary artery disease.  She did tell me that her palpitations were not being helped by the Cardizem.  We stopped the Cardizem and start the patient on propanolol 20 mg at nighttime and flecainide 50 mg twice a day.  Today she tells me she has some improvement but there is still some symptoms that she is concerned about.  Past Medical History:  Diagnosis Date  . Alcohol abuse 02/21/2014  . Allergic rhinitis 01/31/2008   Qualifier: Diagnosis of  By: Truman Hayward (AAMA), Epifanio Lesches of this note might be different from the original. Overview:  Qualifier: Diagnosis of  By: Truman Hayward Duncan Dull), Lynnae Sandhoff  . Anxiety 07/22/2016  . Arthritis 07/22/2016   Formatting of this note might be different from the original. Knees Formatting of this note might be different from the original. Overview:  Knees  . Asthma 01/31/2008   Qualifier: Diagnosis of  By: Truman Hayward (Duncan Dull), Epifanio Lesches of this note might be different from the original. Overview:  Qualifier: Diagnosis of  By: Truman Hayward Duncan Dull), Lynnae Sandhoff  . ASTHMA 01/31/2008   Qualifier: Diagnosis of  By: Truman Hayward Duncan Dull), Lynnae Sandhoff    . Bell's palsy    age 81-14  . Body  mass index (BMI) of 50.0 to 59.9 in adult (HCC) 01/06/2017  . Complex atypical endometrial hyperplasia 08/01/2013  . Constipation 06/28/2018  . DYSPNEA 01/31/2008   Qualifier: Diagnosis of  By: Vassie Loll MD, Comer Locket    . Episodic mood disorder (HCC) 02/04/2012  . Essential hypertension 02/03/2011  . Folliculitis 10/12/2017  . GERD (gastroesophageal reflux disease) 12/03/2012  . History of Helicobacter pylori infection 03/03/2017  . Hx of migraines 02/13/2020  . Hyperlipidemia   . Hypertension   . Intertrigo 09/01/2017  . Lumbosacral radiculopathy 09/01/2013  . Major depressive disorder, recurrent episode (HCC) 05/02/2014  . Medication overuse headache 07/27/2014  . Migraine with aura and without status migrainosus, not intractable 07/27/2014   Formatting of this note might be different from the original. Follows with Dr. Weston Settle in Neurology. HX pseudotumor cerebri  . Mixed stress and urge urinary incontinence 08/12/2013  . Multiple allergies 02/13/2020  . Obstructive sleep apnea on CPAP 12/27/2010   Formatting of this note might be different from the original. Uses Cpap  Formatting of this note might be different from the original. AHI=21.8 AutoPAP at 15-20 with an EPR=3  Last Assessment & Plan:  Formatting of this note might be different from the original. Relevant Hx: Course: Daily Update: Today's Plan:  . 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 Patellofemoral pain syndrome 10/12/2012  . Personal history of other diseases  of the nervous system and sense organs 03/18/1995  . Posttraumatic stress disorder 02/04/2012   Last Assessment & Plan:  Formatting of this note might be different from the original. Relevant Hx: Course: Daily Update: Today's Plan:  Electronically signed by: Seward Speckharles Choi, MD Resident 12/22/14 [phone]  . Prediabetes 06/30/2017   Formatting of this note might be different from the original. A1C 6.1 on 06/2017  . Primary osteoarthritis of right knee 10/12/2012  . Pseudotumor    . Right cervical radiculopathy 07/18/2014  . Sleep apnea   . SVT (supraventricular tachycardia) (HCC) 07/15/2016  . Tobacco use 12/03/2011  . Umbilical cyst 06/02/2011  . Urinary retention 08/19/2013   Formatting of this note might be different from the original. S/P interstim placement with Urology  . Vulvovaginal candidiasis 03/03/2017    Past Surgical History:  Procedure Laterality Date  . BLADDER SURGERY    . TONSILLECTOMY AND ADENOIDECTOMY    . VAGINAL HYSTERECTOMY  2015    Current Medications: Current Meds  Medication Sig  . albuterol (VENTOLIN HFA) 108 (90 Base) MCG/ACT inhaler every 4 (four) hours as needed. Every 4-6 hours as needed  . Cholecalciferol (VITAMIN D3) 1.25 MG (50000 UT) CAPS Take 1 capsule by mouth once a week.  . diazepam (VALIUM) 5 MG tablet Take 5 mg by mouth once.  Marland Kitchen. EPINEPHrine 0.3 mg/0.3 mL IJ SOAJ injection Inject into the muscle.  . ferrous sulfate 325 (65 FE) MG tablet Take 325 mg by mouth daily with breakfast.  . flecainide (TAMBOCOR) 50 MG tablet Take 1 tablet (50 mg total) by mouth 2 (two) times daily.  . furosemide (LASIX) 20 MG tablet Take 1 tablet (20 mg total) by mouth 3 (three) times a week.  . linaclotide (LINZESS) 290 MCG CAPS capsule Take 290 mcg by mouth daily before breakfast.  . loratadine (CLARITIN) 10 MG tablet Take 10 mg by mouth daily.  . Multiple Vitamin (MULTI-VITAMIN) tablet   . ondansetron (ZOFRAN) 4 MG tablet Take 4 mg by mouth every 8 (eight) hours as needed for nausea or vomiting.  . potassium chloride SA (KLOR-CON) 20 MEQ tablet Take 1 tablet (20 mEq total) by mouth 3 (three) times a week.  . promethazine (PHENERGAN) 12.5 MG tablet   . RISPERDAL CONSTA 25 MG injection Inject into the muscle every 14 (fourteen) days.   Marland Kitchen. sertraline (ZOLOFT) 100 MG tablet Take 100 mg by mouth daily.  . traZODone (DESYREL) 50 MG tablet   . [DISCONTINUED] DILT-XR 120 MG 24 hr capsule Take 120 mg by mouth daily.  . [DISCONTINUED] propranolol (INDERAL)  20 MG tablet Take 1 tablet (20 mg total) by mouth at bedtime.     Allergies:   Ciprofloxacin, Clindamycin, Esomeprazole magnesium, Nitrofurantoin, Oxycodone, Sulfa antibiotics, Sulfamethoxazole-trimethoprim, Trimethoprim, Bee pollen, Citrus, Doxycycline, Fluticasone, Penicillins, Pollen extract, Bee venom, Buspirone, Esomeprazole magnesium, Fentanyl, Flonase [fluticasone propionate], Fluticasone propionate, Lexapro [escitalopram], Paliperidone, Pantoprazole, and Orange oil   Social History   Socioeconomic History  . Marital status: Legally Separated    Spouse name: Not on file  . Number of children: Not on file  . Years of education: Not on file  . Highest education level: Not on file  Occupational History  . Not on file  Tobacco Use  . Smoking status: Former Smoker    Quit date: 01/19/2020    Years since quitting: 0.2  . Smokeless tobacco: Never Used  Substance and Sexual Activity  . Alcohol use: Not Currently  . Drug use: No  . Sexual activity:  Not on file  Other Topics Concern  . Not on file  Social History Narrative  . Not on file   Social Determinants of Health   Financial Resource Strain: Not on file  Food Insecurity: Not on file  Transportation Needs: Not on file  Physical Activity: Not on file  Stress: Not on file  Social Connections: Not on file     Family History: The patient's family history includes Atrial fibrillation in her father; COPD in her father; Congestive Heart Failure in her father; Glaucoma in her father; Heart attack in her maternal grandmother; High Cholesterol in her mother; Hypertension in her father; Kidney disease in her father; Lung cancer in her father and paternal grandmother; Macular degeneration in her father; Stroke in her father.  ROS:   Review of Systems  Constitution: Negative for decreased appetite, fever and weight gain.  HENT: Negative for congestion, ear discharge, hoarse voice and sore throat.   Eyes: Negative for discharge,  redness, vision loss in right eye and visual halos.  Cardiovascular: Negative for chest pain, dyspnea on exertion, leg swelling, orthopnea and palpitations.  Respiratory: Negative for cough, hemoptysis, shortness of breath and snoring.   Endocrine: Negative for heat intolerance and polyphagia.  Hematologic/Lymphatic: Negative for bleeding problem. Does not bruise/bleed easily.  Skin: Negative for flushing, nail changes, rash and suspicious lesions.  Musculoskeletal: Negative for arthritis, joint pain, muscle cramps, myalgias, neck pain and stiffness.  Gastrointestinal: Negative for abdominal pain, bowel incontinence, diarrhea and excessive appetite.  Genitourinary: Negative for decreased libido, genital sores and incomplete emptying.  Neurological: Negative for brief paralysis, focal weakness, headaches and loss of balance.  Psychiatric/Behavioral: Negative for altered mental status, depression and suicidal ideas.  Allergic/Immunologic: Negative for HIV exposure and persistent infections.    EKGs/Labs/Other Studies Reviewed:    The following studies were reviewed today:   EKG:  The ekg ordered today demonstrates sinus rhythm, heart rate 81 bpm compared to prior EKG QTc has improved was 497 now 462.  CCTA  Aorta: Normal size. No calcifications. No dissection.  Aortic Valve: Trileaflet. No calcifications.  Coronary Arteries: Normal coronary origin. Right dominance.  RCA is a large dominant artery that gives rise to PDA and PLVB. There is no plaque.  Left main is a large artery that gives rise to LAD and LCX arteries.  LAD is a large vessel that has no plaque.  LCX is a non-dominant artery that gives rise to one large OM1 branch. There is no plaque.  Other findings:  Normal pulmonary vein drainage into the left atrium.  Normal left atrial appendage without a thrombus.  Normal size of the pulmonary artery.  IMPRESSION: 1. Coronary calcium score of 0. This  was 0 percentile for age and sex matched control.  2. Normal coronary origin with right dominance.  3. No evidence of CAD.   Zio monitor The patient wore the monitor for 14 days starting February 15, 2020 . Indication: Palpitations  The minimum heart rate was 47 bpm, maximum heart rate was 240 bpm, and average heart rate was 85 bpm. Predominant underlying rhythm was Sinus Rhythm. First-degree AV block was present.  9 Ventricular Tachycardia runs occurred, the run with the fastest interval lasting 5 beats with a maximum rate of 240 bpm, the longest lasting 5 beats with an average rate of 184 bpm.  Premature atrial complexes were rare less than 1%. Premature Ventricular complexes rare less than 1%.  No pauses, No AV block and no atrial fibrillation present. 14  patient triggered events and 13 diary events associated with premature ventricular complex.   Conclusion: This study is remarkable for the following: 1. Nonsustained ventricular tachycardia 2. Rare symptomatic premature ventricular complex.  Transthoracic EchocardiogramIMPRESSIONS  1. Left ventricular ejection fraction, by estimation, is 60 to 65%. The left ventricle has normal function. The left ventricle has no regional wall motion abnormalities. Left ventricular diastolic parameters were normal.  2. Right ventricular systolic function is normal. The right ventricular size is normal.  3. The mitral valve is normal in structure. No evidence of mitral valve regurgitation. No evidence of mitral stenosis.  4. The aortic valve is normal in structure. Aortic valve regurgitation is not visualized. No aortic stenosis is present.  5. The inferior vena cava is normal in size with greater than 50% respiratory variability, suggesting right atrial pressure of 3 mmHg.   FINDINGS  Left Ventricle: Left ventricular ejection fraction, by estimation, is 60 to 65%. The  left ventricle has normal function. The left ventricle has no regional wall motion abnormalities. Definity contrast agent was given IV to delineate the left ventricular  endocardial borders. The left ventricular internal cavity size was normal in size. There is no left ventricular hypertrophy. Left ventricular diastolic parameters were normal.   Right Ventricle: The right ventricular size is normal. No increase in right ventricular wall thickness. Right ventricular systolic function is  normal.   Left Atrium: Left atrial size was normal in size.   Right Atrium: Right atrial size was normal in size.   Pericardium: There is no evidence of pericardial effusion.   Mitral Valve: The mitral valve is normal in structure. No evidence of  mitral valve regurgitation. No evidence of mitral valve stenosis.   Tricuspid Valve: The tricuspid valve is normal in structure. Tricuspid  valve regurgitation is trivial. No evidence of tricuspid stenosis.   Aortic Valve: The aortic valve is normal in structure. Aortic valve  regurgitation is not visualized. No aortic stenosis is present.   Pulmonic Valve: The pulmonic valve was normal in structure. Pulmonic valve  regurgitation is not visualized. No evidence of pulmonic stenosis.   Aorta: The aortic root is normal in size and structure.   Venous: The inferior vena cava is normal in size with greater than 50%  respiratory variability, suggesting right atrial pressure of 3 mmHg.   IAS/Shunts: No atrial level shunt detected by color flow Doppler.    Recent Labs: 03/19/2020: BUN 6; Creatinine, Ser 0.56; Magnesium 2.1; Potassium 4.5; Sodium 140  Recent Lipid Panel No results found for: CHOL, TRIG, HDL, CHOLHDL, VLDL, LDLCALC, LDLDIRECT  Physical Exam:    VS:  BP 108/68   Pulse 81   Ht 5\' 9"  (1.753 m)   Wt (!) 330 lb 9.6 oz (150 kg)   SpO2 96%   BMI 48.82 kg/m     Wt Readings from Last 3 Encounters:  04/17/20 (!) 330 lb 9.6 oz (150 kg)  04/11/20  (!) 332 lb 6.4 oz (150.8 kg)  03/19/20 (!) 338 lb 3.2 oz (153.4 kg)     GEN: Well nourished, well developed in no acute distress HEENT: Normal NECK: No JVD; No carotid bruits LYMPHATICS: No lymphadenopathy CARDIAC: S1S2 noted,RRR, no murmurs, rubs, gallops RESPIRATORY:  Clear to auscultation without rales, wheezing or rhonchi  ABDOMEN: Soft, non-tender, non-distended, +bowel sounds, no guarding. EXTREMITIES: No edema, No cyanosis, no clubbing MUSCULOSKELETAL:  No deformity  SKIN: Warm and dry NEUROLOGIC:  Alert and oriented x 3, non-focal PSYCHIATRIC:  Normal affect, good insight  ASSESSMENT:    1. NSVT (nonsustained ventricular tachycardia) (HCC)   2. SVT (supraventricular tachycardia) (HCC)   3. PVC (premature ventricular contraction)   4. Hypertension, unspecified type   5. Morbid obesity (HCC)    PLAN:     She is improving clinically but there are still some palpitations that bother the patient I do think underlying anxiety may be playing a role here.  What I like to do is increase her propanolol to 20 mg twice a day.  Keep the patient on flecainide as well.  Her blood pressure deceptively.  We will continue to monitor this with increased dose of propanolol. She has an appointment already with her healthy weight and wellness program she is looking forward to it.  The patient is in agreement with the above plan. The patient left the office in stable condition.  The patient will follow up as scheduled.   Medication Adjustments/Labs and Tests Ordered: Current medicines are reviewed at length with the patient today.  Concerns regarding medicines are outlined above.  Orders Placed This Encounter  Procedures  . EKG 12-Lead   Meds ordered this encounter  Medications  . propranolol (INDERAL) 20 MG tablet    Sig: Take 1 tablet (20 mg total) by mouth 2 (two) times daily.    Dispense:  60 tablet    Refill:  3    Patient Instructions  Medication Instructions:  Your  physician has recommended you make the following change in your medication: START: Propranolol 20 mg twice daily  *If you need a refill on your cardiac medications before your next appointment, please call your pharmacy*   Lab Work: None If you have labs (blood work) drawn today and your tests are completely normal, you will receive your results only by: Marland Kitchen MyChart Message (if you have MyChart) OR . A paper copy in the mail If you have any lab test that is abnormal or we need to change your treatment, we will call you to review the results.   Testing/Procedures: None   Follow-Up: At [facility], you and your health needs are our priority.  As part of our continuing mission to provide you with exceptional heart care, we have created designated Provider Care Teams.  These Care Teams include your primary Cardiologist (physician) and Advanced Practice Providers (APPs -  Physician Assistants and Nurse Practitioners) who all work together to provide you with the care you need, when you need it.  We recommend signing up for the patient portal called "MyChart".  Sign up information is provided on this After Visit Summary.  MyChart is used to connect with patients for Virtual Visits (Telemedicine).  Patients are able to view lab/test results, encounter notes, upcoming appointments, etc.  Non-urgent messages can be sent to your provider as well.   To learn more about what you can do with MyChart, go to [email or web address].    Your next appointment:   05/11/2020  The format for your next appointment:   In Person  Provider:   Thomasene Ripple, DO   Other Instructions      Adopting a Healthy Lifestyle.  Know what a healthy weight is for you (roughly BMI <25) and aim to maintain this   Aim for 7+ servings of fruits and vegetables daily   65-80+ fluid ounces of water or unsweet tea for healthy kidneys   Limit to max 1 drink of alcohol per day; avoid smoking/tobacco   Limit animal  fats in diet for cholesterol and heart health -  choose grass fed whenever available   Avoid highly processed foods, and foods high in saturated/trans fats   Aim for low stress - take time to unwind and care for your mental health   Aim for 150 min of moderate intensity exercise weekly for heart health, and weights twice weekly for bone health   Aim for 7-9 hours of sleep daily   When it comes to diets, agreement about the perfect plan isnt easy to find, even among the experts. Experts at the [facility] [employer] [facility] developed an idea known as the Healthy Eating Plate. Just imagine a plate divided into logical, healthy portions.   The emphasis is on diet quality:   Load up on vegetables and fruits - one-half of your plate: Aim for color and variety, and remember that potatoes dont count.   Go for whole grains - one-quarter of your plate: Whole wheat, barley, wheat berries, quinoa, oats, brown rice, and foods made with them. If you want pasta, go with whole wheat pasta.   Protein power - one-quarter of your plate: Fish, chicken, beans, and nuts are all healthy, versatile protein sources. Limit red meat.   The diet, however, does go beyond the plate, offering a few other suggestions.   Use healthy plant oils, such as olive, canola, soy, corn, sunflower and peanut. Check the labels, and avoid partially hydrogenated oil, which have unhealthy trans fats.   If youre thirsty, drink water. Coffee and tea are good in moderation, but skip sugary drinks and limit milk and dairy products to one or two daily servings.   The type of carbohydrate in the diet is more important than the amount. Some sources of carbohydrates, such as vegetables, fruits, whole grains, and beans-are healthier than others.   Finally, stay active  Signed, Thomasene Ripple, DO  04/17/2020 3:05 PM    [facility]

## 2020-04-25 ENCOUNTER — Ambulatory Visit: Payer: Medicare Other | Admitting: Cardiology

## 2020-05-02 ENCOUNTER — Encounter: Payer: Medicare Other | Admitting: Registered"

## 2020-05-11 ENCOUNTER — Encounter: Payer: Self-pay | Admitting: Cardiology

## 2020-05-11 ENCOUNTER — Other Ambulatory Visit: Payer: Self-pay

## 2020-05-11 ENCOUNTER — Ambulatory Visit (INDEPENDENT_AMBULATORY_CARE_PROVIDER_SITE_OTHER): Payer: Medicare Other | Admitting: Cardiology

## 2020-05-11 VITALS — BP 108/78 | HR 66 | Ht 69.0 in | Wt 333.0 lb

## 2020-05-11 DIAGNOSIS — I472 Ventricular tachycardia: Secondary | ICD-10-CM | POA: Diagnosis not present

## 2020-05-11 DIAGNOSIS — I10 Essential (primary) hypertension: Secondary | ICD-10-CM | POA: Diagnosis not present

## 2020-05-11 DIAGNOSIS — I471 Supraventricular tachycardia: Secondary | ICD-10-CM

## 2020-05-11 DIAGNOSIS — I4729 Other ventricular tachycardia: Secondary | ICD-10-CM

## 2020-05-11 DIAGNOSIS — Z720 Tobacco use: Secondary | ICD-10-CM

## 2020-05-11 MED ORDER — PROPRANOLOL HCL 10 MG PO TABS: 10.0000 mg | ORAL_TABLET | Freq: Two times a day (BID) | ORAL | 3 refills | Status: DC

## 2020-05-11 NOTE — Progress Notes (Signed)
Cardiology Office Note:    Date:  05/11/2020   ID:  Ellen Shaw, DOB 1980-10-15, MRN [id number]  PCP:  Marlyn Corporal, PA  Cardiologist:  Thomasene Ripple, DO  Electrophysiologist:  None   Referring MD: Marlyn Corporal, PA   I feel a lot better but sometime with dizziness, my heart rate is getting low.  History of Present Illness:    Ellen Shaw is a 40 y.o. female with a hx of hypertension, morbid obesity, nonsustained ventricular tachycardia which was seen ZIO monitor.  When I last saw the patient 1 March 20, 2019 at that time she had some chest pain and based on her I did have some multiple episodes of nonsustained ventricular tachycardia I recommended she undergo a coronary CTA to rule out coronary artery disease.  I saw the patient on April 12, 2019 we discussed her CT results which showed 0 calcium with no evidence of coronary artery disease.  She did tell me that her palpitations were not being helped by the Cardizem.  We stopped the Cardizem and start the patient on propanolol 20 mg at nighttime and flecainide 50 mg twice a day.  At her last visit we increase her propranolol to 20 mg twice daily. But she tells me that she has improved with the increase in propranolol but this has made her a bit dizzy.  Past Medical History:  Diagnosis Date  . Alcohol abuse 02/21/2014  . Allergic rhinitis 01/31/2008   Qualifier: Diagnosis of  By: Truman Hayward (AAMA), Epifanio Lesches of this note might be different from the original. Overview:  Qualifier: Diagnosis of  By: Truman Hayward Duncan Dull), Lynnae Sandhoff  . Anxiety 07/22/2016  . Arthritis 07/22/2016   Formatting of this note might be different from the original. Knees Formatting of this note might be different from the original. Overview:  Knees  . Asthma 01/31/2008   Qualifier: Diagnosis of  By: Truman Hayward (Duncan Dull), Epifanio Lesches of this note might be different from the original. Overview:  Qualifier: Diagnosis of  By: Truman Hayward Duncan Dull),  Lynnae Sandhoff  . ASTHMA 01/31/2008   Qualifier: Diagnosis of  By: Truman Hayward Duncan Dull), Lynnae Sandhoff    . Bell's palsy    age 61-14  . Bilateral leg edema 02/15/2020  . Body mass index (BMI) of 50.0 to 59.9 in adult (HCC) 01/06/2017  . Chest discomfort 03/19/2020  . Complex atypical endometrial hyperplasia 08/01/2013  . Constipation 06/28/2018  . DYSPNEA 01/31/2008   Qualifier: Diagnosis of  By: Vassie Loll MD, Comer Locket    . Episodic mood disorder (HCC) 02/04/2012  . Essential hypertension 02/03/2011  . Folliculitis 10/12/2017  . GERD (gastroesophageal reflux disease) 12/03/2012  . History of Helicobacter pylori infection 03/03/2017  . Hx of migraines 02/13/2020  . Hyperlipidemia   . Hypertension   . Intertrigo 09/01/2017  . Lumbosacral radiculopathy 09/01/2013  . Major depressive disorder, recurrent episode (HCC) 05/02/2014  . Medication overuse headache 07/27/2014  . Migraine with aura and without status migrainosus, not intractable 07/27/2014   Formatting of this note might be different from the original. Follows with Dr. Weston Settle in Neurology. HX pseudotumor cerebri  . Mixed stress and urge urinary incontinence 08/12/2013  . Morbid obesity (HCC) 02/15/2020  . Multiple allergies 02/13/2020  . NSVT (nonsustained ventricular tachycardia) (HCC) 03/19/2020  . Obstructive sleep apnea on CPAP 12/27/2010   Formatting of this note might be different from the original. Uses Cpap  Formatting of this note might be different from the original. AHI=21.8  AutoPAP at 15-20 with an EPR=3  Last Assessment & Plan:  Formatting of this note might be different from the original. Relevant Hx: Course: Daily Update: Today's Plan:  . 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 Palpitations 02/15/2020  . Patellofemoral pain syndrome 10/12/2012  . Personal history of other diseases of the nervous system and sense organs 03/18/1995  . Posttraumatic stress disorder 02/04/2012   Last Assessment & Plan:  Formatting of this note might be  different from the original. Relevant Hx: Course: Daily Update: Today's Plan:  Electronically signed by: Seward Speck, MD Resident 12/22/14 [phone]  . Prediabetes 06/30/2017   Formatting of this note might be different from the original. A1C 6.1 on 06/2017  . Primary hypertension 02/15/2020  . Primary osteoarthritis of right knee 10/12/2012  . Pseudotumor   . PVC (premature ventricular contraction) 03/19/2020  . Right bundle branch block 02/15/2020  . Right cervical radiculopathy 07/18/2014  . Sleep apnea   . SVT (supraventricular tachycardia) (HCC) 07/15/2016  . Tobacco use 12/03/2011  . Umbilical cyst 06/02/2011  . Urinary retention 08/19/2013   Formatting of this note might be different from the original. S/P interstim placement with Urology  . Vulvovaginal candidiasis 03/03/2017    Past Surgical History:  Procedure Laterality Date  . BLADDER SURGERY    . TONSILLECTOMY AND ADENOIDECTOMY    . VAGINAL HYSTERECTOMY  2015    Current Medications: Current Meds  Medication Sig  . albuterol (VENTOLIN HFA) 108 (90 Base) MCG/ACT inhaler every 4 (four) hours as needed. Every 4-6 hours as needed  . Cholecalciferol (VITAMIN D3) 1.25 MG (50000 UT) CAPS Take 1 capsule by mouth once a week.  . diazepam (VALIUM) 5 MG tablet Take 5 mg by mouth once.  Marland Kitchen EPINEPHrine 0.3 mg/0.3 mL IJ SOAJ injection Inject into the muscle.  . ferrous sulfate 325 (65 FE) MG tablet Take 325 mg by mouth daily with breakfast.  . flecainide (TAMBOCOR) 50 MG tablet Take 1 tablet (50 mg total) by mouth 2 (two) times daily.  . furosemide (LASIX) 20 MG tablet Take 1 tablet (20 mg total) by mouth 3 (three) times a week.  . linaclotide (LINZESS) 290 MCG CAPS capsule Take 290 mcg by mouth daily before breakfast.  . loratadine (CLARITIN) 10 MG tablet Take 10 mg by mouth daily.  . Multiple Vitamin (MULTI-VITAMIN) tablet   . ondansetron (ZOFRAN) 4 MG tablet Take 4 mg by mouth every 8 (eight) hours as needed for nausea or vomiting.  . potassium  chloride SA (KLOR-CON) 20 MEQ tablet Take 1 tablet (20 mEq total) by mouth 3 (three) times a week.  . promethazine (PHENERGAN) 12.5 MG tablet   . propranolol (INDERAL) 10 MG tablet Take 1 tablet (10 mg total) by mouth 2 (two) times daily.  Marland Kitchen RISPERDAL CONSTA 25 MG injection Inject into the muscle every 14 (fourteen) days.   Marland Kitchen sertraline (ZOLOFT) 100 MG tablet Take 100 mg by mouth daily.  . traZODone (DESYREL) 50 MG tablet   . vitamin B-12 (CYANOCOBALAMIN) 1000 MCG tablet Take 1,000 mcg by mouth daily.  . [DISCONTINUED] propranolol (INDERAL) 20 MG tablet Take 1 tablet (20 mg total) by mouth 2 (two) times daily.     Allergies:   Ciprofloxacin, Clindamycin, Esomeprazole magnesium, Nitrofurantoin, Oxycodone, Sulfa antibiotics, Sulfamethoxazole-trimethoprim, Trimethoprim, Bee pollen, Citrus, Doxycycline, Fluticasone, Penicillins, Pollen extract, Bee venom, Buspirone, Esomeprazole magnesium, Fentanyl, Flonase [fluticasone propionate], Fluticasone propionate, Lexapro [escitalopram], Paliperidone, Pantoprazole, and Orange oil   Social History   Socioeconomic History  .  Marital status: Legally Separated    Spouse name: Not on file  . Number of children: Not on file  . Years of education: Not on file  . Highest education level: Not on file  Occupational History  . Not on file  Tobacco Use  . Smoking status: Former Smoker    Quit date: 01/19/2020    Years since quitting: 0.3  . Smokeless tobacco: Never Used  Substance and Sexual Activity  . Alcohol use: Not Currently  . Drug use: No  . Sexual activity: Not on file  Other Topics Concern  . Not on file  Social History Narrative  . Not on file   Social Determinants of Health   Financial Resource Strain: Not on file  Food Insecurity: Not on file  Transportation Needs: Not on file  Physical Activity: Not on file  Stress: Not on file  Social Connections: Not on file     Family History: The patient's family history includes Atrial  fibrillation in her father; COPD in her father; Congestive Heart Failure in her father; Glaucoma in her father; Heart attack in her maternal grandmother; High Cholesterol in her mother; Hypertension in her father; Kidney disease in her father; Lung cancer in her father and paternal grandmother; Macular degeneration in her father; Stroke in her father.  ROS:   Review of Systems  Constitution: Negative for decreased appetite, fever and weight gain.  HENT: Negative for congestion, ear discharge, hoarse voice and sore throat.   Eyes: Negative for discharge, redness, vision loss in right eye and visual halos.  Cardiovascular: Negative for chest pain, dyspnea on exertion, leg swelling, orthopnea and palpitations.  Respiratory: Negative for cough, hemoptysis, shortness of breath and snoring.   Endocrine: Negative for heat intolerance and polyphagia.  Hematologic/Lymphatic: Negative for bleeding problem. Does not bruise/bleed easily.  Skin: Negative for flushing, nail changes, rash and suspicious lesions.  Musculoskeletal: Negative for arthritis, joint pain, muscle cramps, myalgias, neck pain and stiffness.  Gastrointestinal: Negative for abdominal pain, bowel incontinence, diarrhea and excessive appetite.  Genitourinary: Negative for decreased libido, genital sores and incomplete emptying.  Neurological: Negative for brief paralysis, focal weakness, headaches and loss of balance.  Psychiatric/Behavioral: Negative for altered mental status, depression and suicidal ideas.  Allergic/Immunologic: Negative for HIV exposure and persistent infections.    EKGs/Labs/Other Studies Reviewed:    The following studies were reviewed today:   EKG:  None today CCTA Aorta: Normal size. No calcifications. No dissection.  Aortic Valve: Trileaflet. No calcifications.  Coronary Arteries: Normal coronary origin. Right dominance.  RCA is a large dominant artery that gives rise to PDA and PLVB. There is  no plaque.  Left main is a large artery that gives rise to LAD and LCX arteries.  LAD is a large vessel that has no plaque.  LCX is a non-dominant artery that gives rise to one large OM1 branch. There is no plaque.  Other findings:  Normal pulmonary vein drainage into the left atrium.  Normal left atrial appendage without a thrombus.  Normal size of the pulmonary artery.  IMPRESSION: 1. Coronary calcium score of 0. This was 0 percentile for age and sex matched control.  2. Normal coronary origin with right dominance.  3. No evidence of CAD.   Zio monitor The patient wore the monitor for 14 days starting February 15, 2020 . Indication: Palpitations  The minimum heart rate was 47 bpm, maximum heart rate was 240 bpm, and average heart rate was 85 bpm. Predominant underlying rhythm  was Sinus Rhythm. First-degree AV block was present.  9 Ventricular Tachycardia runs occurred, the run with the fastest interval lasting 5 beats with a maximum rate of 240 bpm, the longest lasting 5 beats with an average rate of 184 bpm.  Premature atrial complexes were rare less than 1%. Premature Ventricular complexes rare less than 1%.  No pauses, No AV block and no atrial fibrillation present. 14 patient triggered events and 13 diary events associated with premature ventricular complex.   Conclusion: This study is remarkable for the following: 1. Nonsustained ventricular tachycardia 2. Rare symptomatic premature ventricular complex.  Transthoracic EchocardiogramIMPRESSIONS  1. Left ventricular ejection fraction, by estimation, is 60 to 65%. The left ventricle has normal function. The left ventricle has no regional wall motion abnormalities. Left ventricular diastolic parameters were normal.  2. Right ventricular systolic function is normal. The right ventricular size is normal.  3. The mitral valve is normal in  structure. No evidence of mitral valve regurgitation. No evidence of mitral stenosis.  4. The aortic valve is normal in structure. Aortic valve regurgitation is not visualized. No aortic stenosis is present.  5. The inferior vena cava is normal in size with greater than 50% respiratory variability, suggesting right atrial pressure of 3 mmHg.   FINDINGS  Left Ventricle: Left ventricular ejection fraction, by estimation, is 60 to 65%. The left ventricle has normal function. The left ventricle has no regional wall motion abnormalities. Definity contrast agent was given IV to delineate the left ventricular  endocardial borders. The left ventricular internal cavity size was normal in size. There is no left ventricular hypertrophy. Left ventricular diastolic parameters were normal.   Right Ventricle: The right ventricular size is normal. No increase in right ventricular wall thickness. Right ventricular systolic function is  normal.   Left Atrium: Left atrial size was normal in size.   Right Atrium: Right atrial size was normal in size.   Pericardium: There is no evidence of pericardial effusion.   Mitral Valve: The mitral valve is normal in structure. No evidence of  mitral valve regurgitation. No evidence of mitral valve stenosis.   Tricuspid Valve: The tricuspid valve is normal in structure. Tricuspid  valve regurgitation is trivial. No evidence of tricuspid stenosis.   Aortic Valve: The aortic valve is normal in structure. Aortic valve  regurgitation is not visualized. No aortic stenosis is present.   Pulmonic Valve: The pulmonic valve was normal in structure. Pulmonic valve  regurgitation is not visualized. No evidence of pulmonic stenosis.   Aorta: The aortic root is normal in size and structure.   Venous: The inferior vena cava is normal in size with greater than 50%  respiratory variability, suggesting right atrial pressure of 3 mmHg.   IAS/Shunts: No atrial level shunt  detected by color flow Doppler.    Recent Labs: 03/19/2020: BUN 6; Creatinine, Ser 0.56; Magnesium 2.1; Potassium 4.5; Sodium 140  Recent Lipid Panel No results found for: CHOL, TRIG, HDL, CHOLHDL, VLDL, LDLCALC, LDLDIRECT  Physical Exam:    VS:  BP 108/78   Pulse 66   Ht 5\' 9"  (1.753 m)   Wt (!) 333 lb (151 kg)   SpO2 95%   BMI 49.18 kg/m     Wt Readings from Last 3 Encounters:  05/11/20 (!) 333 lb (151 kg)  04/17/20 (!) 330 lb 9.6 oz (150 kg)  04/11/20 (!) 332 lb 6.4 oz (150.8 kg)     GEN: Well nourished, well developed in no acute distress HEENT:  Normal NECK: No JVD; No carotid bruits LYMPHATICS: No lymphadenopathy CARDIAC: S1S2 noted,RRR, no murmurs, rubs, gallops RESPIRATORY:  Clear to auscultation without rales, wheezing or rhonchi  ABDOMEN: Soft, non-tender, non-distended, +bowel sounds, no guarding. EXTREMITIES: No edema, No cyanosis, no clubbing MUSCULOSKELETAL:  No deformity  SKIN: Warm and dry NEUROLOGIC:  Alert and oriented x 3, non-focal PSYCHIATRIC:  Normal affect, good insight  ASSESSMENT:    1. Essential hypertension   2. SVT (supraventricular tachycardia) (HCC)   3. NSVT (nonsustained ventricular tachycardia) (HCC)   4. Tobacco use   5. Morbid obesity (HCC)    PLAN:     We will decrease the propranolol to 10 mg twice a day. And she will continue her current dose of flecainide. She also has a form with her today about her nicotine patch approval. I was able to sign the form for the patient today.   The patient understands the need to lose weight with diet and exercise. We have discussed specific strategies for this.  The patient is in agreement with the above plan. The patient left the office in stable condition.  The patient will follow up in   Medication Adjustments/Labs and Tests Ordered: Current medicines are reviewed at length with the patient today.  Concerns regarding medicines are outlined above.  No orders of the defined types were  placed in this encounter.  Meds ordered this encounter  Medications  . propranolol (INDERAL) 10 MG tablet    Sig: Take 1 tablet (10 mg total) by mouth 2 (two) times daily.    Dispense:  180 tablet    Refill:  3    Patient Instructions  Medication Instructions:  Your physician has recommended you make the following change in your medication: DECREASE: Propranolol 10 mg twice daily  *If you need a refill on your cardiac medications before your next appointment, please call your pharmacy*   Lab Work: None If you have labs (blood work) drawn today and your tests are completely normal, you will receive your results only by: Marland Kitchen MyChart Message (if you have MyChart) OR . A paper copy in the mail If you have any lab test that is abnormal or we need to change your treatment, we will call you to review the results.   Testing/Procedures: None   Follow-Up: At [facility], you and your health needs are our priority.  As part of our continuing mission to provide you with exceptional heart care, we have created designated Provider Care Teams.  These Care Teams include your primary Cardiologist (physician) and Advanced Practice Providers (APPs -  Physician Assistants and Nurse Practitioners) who all work together to provide you with the care you need, when you need it.  We recommend signing up for the patient portal called "MyChart".  Sign up information is provided on this After Visit Summary.  MyChart is used to connect with patients for Virtual Visits (Telemedicine).  Patients are able to view lab/test results, encounter notes, upcoming appointments, etc.  Non-urgent messages can be sent to your provider as well.   To learn more about what you can do with MyChart, go to [email or web address].    Your next appointment:   6 month(s)  The format for your next appointment:   In Person  Provider:   Thomasene Ripple, DO   Other Instructions      Adopting a Healthy Lifestyle.  Know  what a healthy weight is for you (roughly BMI <25) and aim to maintain this   Aim for  7+ servings of fruits and vegetables daily   65-80+ fluid ounces of water or unsweet tea for healthy kidneys   Limit to max 1 drink of alcohol per day; avoid smoking/tobacco   Limit animal fats in diet for cholesterol and heart health - choose grass fed whenever available   Avoid highly processed foods, and foods high in saturated/trans fats   Aim for low stress - take time to unwind and care for your mental health   Aim for 150 min of moderate intensity exercise weekly for heart health, and weights twice weekly for bone health   Aim for 7-9 hours of sleep daily   When it comes to diets, agreement about the perfect plan isnt easy to find, even among the experts. Experts at the [facility] [employer] developed an idea known as the Healthy Eating Plate. Just imagine a plate divided into logical, healthy portions.   The emphasis is on diet quality:   Load up on vegetables and fruits - one-half of your plate: Aim for color and variety, and remember that potatoes dont count.   Go for whole grains - one-quarter of your plate: Whole wheat, barley, wheat berries, quinoa, oats, brown rice, and foods made with them. If you want pasta, go with whole wheat pasta.   Protein power - one-quarter of your plate: Fish, chicken, beans, and nuts are all healthy, versatile protein sources. Limit red meat.   The diet, however, does go beyond the plate, offering a few other suggestions.   Use healthy plant oils, such as olive, canola, soy, corn, sunflower and peanut. Check the labels, and avoid partially hydrogenated oil, which have unhealthy trans fats.   If youre thirsty, drink water. Coffee and tea are good in moderation, but skip sugary drinks and limit milk and dairy products to one or two daily servings.   The type of carbohydrate in the diet is more important than the amount. Some sources of  carbohydrates, such as vegetables, fruits, whole grains, and beans-are healthier than others.   Finally, stay active  Signed, Thomasene Ripple, DO  05/11/2020 7:57 PM    [facility]

## 2020-05-11 NOTE — Patient Instructions (Signed)
Medication Instructions:  Your physician has recommended you make the following change in your medication: DECREASE: Propranolol 10 mg twice daily  *If you need a refill on your cardiac medications before your next appointment, please call your pharmacy*   Lab Work: None If you have labs (blood work) drawn today and your tests are completely normal, you will receive your results only by: Marland Kitchen MyChart Message (if you have MyChart) OR . A paper copy in the mail If you have any lab test that is abnormal or we need to change your treatment, we will call you to review the results.   Testing/Procedures: None   Follow-Up: At [facility], you and your health needs are our priority.  As part of our continuing mission to provide you with exceptional heart care, we have created designated Provider Care Teams.  These Care Teams include your primary Cardiologist (physician) and Advanced Practice Providers (APPs -  Physician Assistants and Nurse Practitioners) who all work together to provide you with the care you need, when you need it.  We recommend signing up for the patient portal called "MyChart".  Sign up information is provided on this After Visit Summary.  MyChart is used to connect with patients for Virtual Visits (Telemedicine).  Patients are able to view lab/test results, encounter notes, upcoming appointments, etc.  Non-urgent messages can be sent to your provider as well.   To learn more about what you can do with MyChart, go to [email or web address].    Your next appointment:   6 month(s)  The format for your next appointment:   In Person  Provider:   Thomasene Ripple, DO   Other Instructions

## 2020-05-24 ENCOUNTER — Other Ambulatory Visit: Payer: Self-pay

## 2020-05-24 MED ORDER — PROPRANOLOL HCL 20 MG PO TABS: 20.0000 mg | ORAL_TABLET | Freq: Two times a day (BID) | ORAL | 3 refills | Status: DC

## 2020-05-24 NOTE — Progress Notes (Signed)
Patient messaged and states she had to increase her Propranolol back to 20 mg daily. Dr. Servando Salina made aware. Medication list updated.

## 2020-07-12 DIAGNOSIS — R0609 Other forms of dyspnea: Secondary | ICD-10-CM | POA: Insufficient documentation

## 2020-07-25 ENCOUNTER — Telehealth: Payer: Self-pay

## 2020-07-25 DIAGNOSIS — Z006 Encounter for examination for normal comparison and control in clinical research program: Secondary | ICD-10-CM

## 2020-07-25 NOTE — Telephone Encounter (Signed)
Called patient for 90 day Identify phone call no answer, I left a voicemail stating the intent of the phone call and our call back number to be reached in our department. 

## 2020-08-20 ENCOUNTER — Telehealth: Payer: Self-pay

## 2020-08-20 DIAGNOSIS — Z006 Encounter for examination for normal comparison and control in clinical research program: Secondary | ICD-10-CM

## 2020-08-20 NOTE — Telephone Encounter (Signed)
I called patient for her 90-day Identify Study follow up phone call. Patient is doing well with no cardiac symptoms at this time. I reminded patient I would call her in January for her 1 year follow-up. 

## 2020-09-03 ENCOUNTER — Telehealth: Payer: Self-pay

## 2020-09-03 MED ORDER — POTASSIUM CHLORIDE CRYS ER 20 MEQ PO TBCR: 20.0000 meq | EXTENDED_RELEASE_TABLET | ORAL | 3 refills | Status: DC

## 2020-09-03 MED ORDER — FUROSEMIDE 20 MG PO TABS: 20.0000 mg | ORAL_TABLET | ORAL | 3 refills | Status: DC

## 2020-09-03 NOTE — Telephone Encounter (Signed)
Refill sent to pharmacy.   

## 2020-09-19 DIAGNOSIS — M4155 Other secondary scoliosis, thoracolumbar region: Secondary | ICD-10-CM | POA: Insufficient documentation

## 2020-10-05 ENCOUNTER — Telehealth: Payer: Self-pay | Admitting: Cardiology

## 2020-10-05 NOTE — Telephone Encounter (Signed)
Spoke to the patient just now and let her know Dr. Munley's recommendations. She verbalizes understanding and thanks me for calling her back.    Encouraged patient to call back with any questions or concerns. 

## 2020-10-05 NOTE — Telephone Encounter (Signed)
Spoke to the patient just now and she let me know that her most recent blood pressures are as follows:   95/64 P -52- today 7:30am 101/71/ P-49-yesterday 99/68 P -55-yesterday morning 111/73 P -57- Wednesday night 107/72 P 63- Wednesday Morning  She is lightheaded when standing up but denies any other symptoms. She  wen to her PCP yesterday in regards to this and was told to not take her propanolol last night and hold until speaking to her cardiologist. She has not taken her propanolol since yesterday morning and is concerned that her blood pressure is still low this morning.   I advised her that I would reach out for recommendations from Dr. Dulce Sellar and would give her a call back later this afternoon with his recommendations.

## 2020-10-05 NOTE — Telephone Encounter (Signed)
Left message on patients voicemail to please return our call.   

## 2020-10-05 NOTE — Telephone Encounter (Signed)
Pt c/o BP issue: STAT if pt c/o blurred vision, one-sided weakness or slurred speech  1. What are your last 5 BP readings? 95/64 Pulse 52  2. Are you having any other symptoms (ex. Dizziness, headache, blurred vision, passed out)? Lightheadedness when she stands up  3. What is your BP issue? Pt was seen by her PCP on 10/04/20 with Low BP, pt was advised to see her cardiologist  95/64 P -52- today 7:30am 101/71/ P-49-yesterday 99/68 P -55-yesterday morning 11/73 P -57- Wednesday night 107/72 P 63- Wednesday Morning

## 2020-10-15 ENCOUNTER — Ambulatory Visit (INDEPENDENT_AMBULATORY_CARE_PROVIDER_SITE_OTHER): Payer: Medicare Other | Admitting: Cardiology

## 2020-10-15 ENCOUNTER — Encounter: Payer: Self-pay | Admitting: Cardiology

## 2020-10-15 ENCOUNTER — Other Ambulatory Visit: Payer: Self-pay

## 2020-10-15 VITALS — BP 128/84 | HR 74 | Ht 69.0 in | Wt 329.0 lb

## 2020-10-15 DIAGNOSIS — Z720 Tobacco use: Secondary | ICD-10-CM

## 2020-10-15 DIAGNOSIS — I10 Essential (primary) hypertension: Secondary | ICD-10-CM

## 2020-10-15 DIAGNOSIS — I471 Supraventricular tachycardia: Secondary | ICD-10-CM

## 2020-10-15 DIAGNOSIS — I493 Ventricular premature depolarization: Secondary | ICD-10-CM | POA: Diagnosis not present

## 2020-10-15 DIAGNOSIS — J9611 Chronic respiratory failure with hypoxia: Secondary | ICD-10-CM

## 2020-10-15 MED ORDER — PROPRANOLOL HCL 10 MG PO TABS: 10.0000 mg | ORAL_TABLET | Freq: Three times a day (TID) | ORAL | 3 refills | Status: DC

## 2020-10-15 MED ORDER — PROPRANOLOL HCL 10 MG PO TABS
10.0000 mg | ORAL_TABLET | Freq: Every day | ORAL | 3 refills | Status: DC
Start: 2020-10-15 — End: 2022-02-25

## 2020-10-15 NOTE — Progress Notes (Signed)
Cardiology Office Note:    Date:  10/15/2020   ID:  Ellen Shaw, DOB 12-30-1980, MRN [id number]  PCP:  Marlyn Corporal, PA  Cardiologist:  Thomasene Ripple, DO  Electrophysiologist:  None   Referring MD: Marlyn Corporal, PA   Has been having some low blood pressure, but since has stopped the propanolol that has improved and the heart rate is now a problem.   History of Present Illness:    Ellen Shaw is a 40 y.o. female with a hx of chronic respiratory failure now on oxygen managed by her primary care doctor, hypertension, morbid obesity, nonsustained ventricular tachycardia on the monitor, recurrent palpitations here today for follow-up visit.  I saw the patient 1 March 20, 2019 at that time she had some chest pain and based on her I did have some multiple episodes of nonsustained ventricular tachycardia I recommended she undergo a coronary CTA to rule out coronary artery disease.   I saw the patient on April 12, 2019 we discussed her CT results which showed 0 calcium with no evidence of coronary artery disease.  She did tell me that her palpitations were not being helped by the Cardizem.  We stopped the Cardizem and start the patient on propanolol 20 mg at nighttime and flecainide 50 mg twice a day.  Since her last visit the patient had contacted the office that she was having significant low blood pressure we stopped her propanolol for the last couple weeks.  And is seem to have helped as her blood pressure has improved greatly.  She still tells me that since we stopped the propanolol she has had significant amount of palpitations.  No other complaints at this time.  In addition since her last visit she has been started on oxygen for chronic respiratory failure.  Past Medical History:  Diagnosis Date   Alcohol abuse 02/21/2014   Allergic rhinitis 01/31/2008   Qualifier: Diagnosis of  By: Truman Hayward (Duncan Dull), Epifanio Lesches of this note might be different from the original.  Overview:  Qualifier: Diagnosis of  By: Truman Hayward Duncan Dull), Susanne   Anxiety 07/22/2016   Arthritis 07/22/2016   Formatting of this note might be different from the original. Knees Formatting of this note might be different from the original. Overview:  Knees   Asthma 01/31/2008   Qualifier: Diagnosis of  By: Truman Hayward Duncan Dull), Epifanio Lesches of this note might be different from the original. Overview:  Qualifier: Diagnosis of  By: Truman Hayward Duncan Dull), Lynnae Sandhoff   ASTHMA 01/31/2008   Qualifier: Diagnosis of  By: Truman Hayward Duncan Dull), Susanne     Bell's palsy    age 34-14   Bilateral leg edema 02/15/2020   Body mass index (BMI) of 50.0 to 59.9 in adult [facility]) 01/06/2017   Chest discomfort 03/19/2020   Complex atypical endometrial hyperplasia 08/01/2013   Constipation 06/28/2018   DYSPNEA 01/31/2008   Qualifier: Diagnosis of  By: Vassie Loll MD, [place] Locket.     Episodic mood disorder (HCC) 02/04/2012   Essential hypertension 02/03/2011   Folliculitis 10/12/2017   GERD (gastroesophageal reflux disease) 12/03/2012   History of Helicobacter pylori infection 03/03/2017   Hx of migraines 02/13/2020   Hyperlipidemia    Hypertension    Intertrigo 09/01/2017   Lumbosacral radiculopathy 09/01/2013   Major depressive disorder, recurrent episode (HCC) 05/02/2014   Medication overuse headache 07/27/2014   Migraine with aura and without status migrainosus, not intractable 07/27/2014   Formatting of this note might be  different from the original. Follows with Dr. Weston Settle in Neurology. HX pseudotumor cerebri   Mixed stress and urge urinary incontinence 08/12/2013   Morbid obesity (HCC) 02/15/2020   Multiple allergies 02/13/2020   NSVT (nonsustained ventricular tachycardia) (HCC) 03/19/2020   Obstructive sleep apnea on CPAP 12/27/2010   Formatting of this note might be different from the original. Uses Cpap  Formatting of this note might be different from the original. AHI=21.8 AutoPAP at 15-20 with an EPR=3  Last Assessment & Plan:   Formatting of this note might be different from the original. 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 Relevant Hx: Course: Daily Update: Today's Plan:  Electronically signed by: Seward Speck, MD Resident 12/22/14 [phone]   Prediabetes 06/30/2017   Formatting of this note might be different from the original. A1C 6.1 on 06/2017   Primary hypertension 02/15/2020   Primary osteoarthritis of right knee 10/12/2012   Pseudotumor    PVC (premature ventricular contraction) 03/19/2020   Right bundle branch block 02/15/2020   Right cervical radiculopathy 07/18/2014   Sleep apnea    SVT (supraventricular tachycardia) (HCC) 07/15/2016   Tobacco use 12/03/2011   Umbilical cyst 06/02/2011   Urinary retention 08/19/2013   Formatting of this note might be different from the original. S/P interstim placement with Urology   Vulvovaginal candidiasis 03/03/2017    Past Surgical History:  Procedure Laterality Date   BLADDER SURGERY     TONSILLECTOMY AND ADENOIDECTOMY     VAGINAL HYSTERECTOMY  2015    Current Medications: Current Meds  Medication Sig   albuterol (VENTOLIN HFA) 108 (90 Base) MCG/ACT inhaler every 4 (four) hours as needed for wheezing or shortness of breath. Every 4-6 hours as needed   Cholecalciferol (VITAMIN D3) 1.25 MG (50000 UT) CAPS Take 1 capsule by mouth once a week.   EPINEPHrine 0.3 mg/0.3 mL IJ SOAJ injection Inject into the muscle as needed for anaphylaxis.   flecainide (TAMBOCOR) 50 MG tablet Take 1 tablet (50 mg total) by mouth 2 (two) times daily.   furosemide (LASIX) 20 MG tablet Take 1 tablet (20 mg total) by mouth 3 (three) times a week.    linaclotide (LINZESS) 290 MCG CAPS capsule Take 290 mcg by mouth daily before breakfast.   loratadine (CLARITIN) 10 MG tablet Take 10 mg by mouth daily.   Multiple Vitamin (MULTI-VITAMIN) tablet Take 1 tablet by mouth daily.   potassium chloride SA (KLOR-CON) 20 MEQ tablet Take 1 tablet (20 mEq total) by mouth 3 (three) times a week.   promethazine (PHENERGAN) 12.5 MG tablet Take 12.5 mg by mouth every 6 (six) hours as needed for nausea or vomiting.   RISPERDAL CONSTA 25 MG injection Inject into the muscle every 14 (fourteen) days.    Sertraline HCl 150 MG CAPS Take 150 mg by mouth daily.   traZODone (DESYREL) 50 MG tablet Take by mouth at bedtime as needed for sleep.   vitamin B-12 (CYANOCOBALAMIN) 1000 MCG tablet Take 1,000 mcg by mouth daily.   [DISCONTINUED] propranolol (INDERAL) 10 MG tablet Take 1 tablet (10 mg total) by mouth 3 (three) times daily.     Allergies:   Ciprofloxacin, Clindamycin, Esomeprazole magnesium,  Nitrofurantoin, Oxycodone, Sulfa antibiotics, Sulfamethoxazole-trimethoprim, Trimethoprim, Bee pollen, Citrus, Doxycycline, Fluticasone, Penicillins, Pollen extract, Bee venom, Buspirone, Esomeprazole magnesium, Fentanyl, Flonase [fluticasone propionate], Fluticasone propionate, Lexapro [escitalopram], Paliperidone, Pantoprazole, and Orange oil   Social History   Socioeconomic History   Marital status: Single    Spouse name: Not on file   Number of children: Not on file   Years of education: Not on file   Highest education level: Not on file  Occupational History   Not on file  Tobacco Use   Smoking status: Former    Types: Cigarettes    Quit date: 01/19/2020    Years since quitting: 0.7   Smokeless tobacco: Never  Substance and Sexual Activity   Alcohol use: Not Currently   Drug use: No   Sexual activity: Not on file  Other Topics Concern   Not on file  Social History Narrative   Not on file   Social Determinants of Health   Financial Resource Strain: Not  on file  Food Insecurity: Not on file  Transportation Needs: Not on file  Physical Activity: Not on file  Stress: Not on file  Social Connections: Not on file     Family History: The patient's family history includes Atrial fibrillation in her father; COPD in her father; Congestive Heart Failure in her father; Glaucoma in her father; Heart attack in her maternal grandmother; High Cholesterol in her mother; Hypertension in her father; Kidney disease in her father; Lung cancer in her father and paternal grandmother; Macular degeneration in her father; Stroke in her father.  ROS:   Review of Systems  Constitution: Negative for decreased appetite, fever and weight gain.  HENT: Negative for congestion, ear discharge, hoarse voice and sore throat.   Eyes: Negative for discharge, redness, vision loss in right eye and visual halos.  Cardiovascular: Negative for chest pain, dyspnea on exertion, leg swelling, orthopnea and palpitations.  Respiratory: Negative for cough, hemoptysis, shortness of breath and snoring.   Endocrine: Negative for heat intolerance and polyphagia.  Hematologic/Lymphatic: Negative for bleeding problem. Does not bruise/bleed easily.  Skin: Negative for flushing, nail changes, rash and suspicious lesions.  Musculoskeletal: Negative for arthritis, joint pain, muscle cramps, myalgias, neck pain and stiffness.  Gastrointestinal: Negative for abdominal pain, bowel incontinence, diarrhea and excessive appetite.  Genitourinary: Negative for decreased libido, genital sores and incomplete emptying.  Neurological: Negative for brief paralysis, focal weakness, headaches and loss of balance.  Psychiatric/Behavioral: Negative for altered mental status, depression and suicidal ideas.  Allergic/Immunologic: Negative for HIV exposure and persistent infections.    EKGs/Labs/Other Studies Reviewed:    The following studies were reviewed today:   EKG: None today  CCTA  Aorta: Normal  size.  No calcifications.  No dissection.   Aortic Valve:  Trileaflet.  No calcifications.   Coronary Arteries:  Normal coronary origin.  Right dominance.   RCA is a large dominant artery that gives rise to PDA and PLVB. There is no plaque.   Left main is a large artery that gives rise to LAD and LCX arteries.   LAD is a large vessel that has no plaque.   LCX is a non-dominant artery that gives rise to one large OM1 branch. There is no plaque.   Other findings:   Normal pulmonary vein drainage into the left atrium.   Normal left atrial appendage without a thrombus.   Normal size of the pulmonary artery.   IMPRESSION: 1. Coronary calcium score of 0. This was 0 percentile for  age and sex matched control.   2. Normal coronary origin with right dominance.   3. No evidence of CAD.     Zio monitor The patient wore the monitor for 14 days starting February 15, 2020   . Indication: Palpitations   The minimum heart rate was 47 bpm, maximum heart rate was 240 bpm, and average heart rate was 85 bpm. Predominant underlying rhythm was Sinus Rhythm.  First-degree AV block was present.   9 Ventricular Tachycardia runs occurred, the run with the fastest interval lasting 5 beats with a maximum rate of 240 bpm, the longest lasting 5 beats with an average rate of 184 bpm.   Premature atrial complexes were rare less than 1%. Premature Ventricular complexes rare less than 1%.   No pauses, No AV block and no atrial fibrillation present. 14 patient triggered events and 13 diary events associated with premature ventricular complex.   Conclusion: This study is remarkable for the following:                             1.  Nonsustained ventricular tachycardia                             2.  Rare symptomatic premature ventricular complex.   Transthoracic Echocardiogram IMPRESSIONS  1. Left ventricular ejection fraction, by estimation, is 60 to 65%. The left ventricle has normal function. The  left ventricle has no regional wall motion abnormalities. Left ventricular diastolic parameters were normal.   2. Right ventricular systolic function is normal. The right ventricular size is normal.   3. The mitral valve is normal in structure. No evidence of mitral valve regurgitation. No evidence of mitral stenosis.   4. The aortic valve is normal in structure. Aortic valve regurgitation is not visualized. No aortic stenosis is present.   5. The inferior vena cava is normal in size with greater than 50% respiratory variability, suggesting right atrial pressure of 3 mmHg.   FINDINGS   Left Ventricle: Left ventricular ejection fraction, by estimation, is 60 to 65%. The left ventricle has normal function. The left ventricle has no regional wall motion abnormalities. Definity contrast agent was given IV to delineate the left ventricular   endocardial borders. The left ventricular internal cavity size was normal in size. There is no left ventricular hypertrophy. Left ventricular diastolic parameters were normal.   Right Ventricle: The right ventricular size is normal. No increase in right ventricular wall thickness. Right ventricular systolic function is  normal.   Left Atrium: Left atrial size was normal in size.   Right Atrium: Right atrial size was normal in size.   Pericardium: There is no evidence of pericardial effusion.   Mitral Valve: The mitral valve is normal in structure. No evidence of  mitral valve regurgitation. No evidence of mitral valve stenosis.   Tricuspid Valve: The tricuspid valve is normal in structure. Tricuspid  valve regurgitation is trivial. No evidence of tricuspid stenosis.   Aortic Valve: The aortic valve is normal in structure. Aortic valve  regurgitation is not visualized. No aortic stenosis is present.   Pulmonic Valve: The pulmonic valve was normal in structure. Pulmonic valve  regurgitation is not visualized. No evidence of pulmonic stenosis.   Aorta: The  aortic root is normal in size and structure.   Venous: The inferior vena cava is normal in size with greater than 50%  respiratory  variability, suggesting right atrial pressure of 3 mmHg.   IAS/Shunts: No atrial level shunt detected by color flow Doppler.      Recent Labs: 03/19/2020: BUN 6; Creatinine, Ser 0.56; Magnesium 2.1; Potassium 4.5; Sodium 140  Recent Lipid Panel No results found for: CHOL, TRIG, HDL, CHOLHDL, VLDL, LDLCALC, LDLDIRECT  Physical Exam:    VS:  BP 128/84 (BP Location: Left Wrist, Patient Position: Sitting)   Pulse 74   Ht 5\' 9"  (1.753 m)   Wt (!) 329 lb 0.6 oz (149.3 kg)   SpO2 98% Comment: 2 liters [place]  BMI 48.59 kg/m     Wt Readings from Last 3 Encounters:  10/15/20 (!) 329 lb 0.6 oz (149.3 kg)  05/11/20 (!) 333 lb (151 kg)  04/17/20 (!) 330 lb 9.6 oz (150 kg)     GEN: Well nourished, well developed in no acute distress HEENT: Normal NECK: No JVD; No carotid bruits LYMPHATICS: No lymphadenopathy CARDIAC: S1S2 noted,RRR, no murmurs, rubs, gallops RESPIRATORY:  Clear to auscultation without rales, wheezing or rhonchi  ABDOMEN: Soft, non-tender, non-distended, +bowel sounds, no guarding. EXTREMITIES: No edema, No cyanosis, no clubbing MUSCULOSKELETAL:  No deformity  SKIN: Warm and dry NEUROLOGIC:  Alert and oriented x 3, non-focal PSYCHIATRIC:  Normal affect, good insight  ASSESSMENT:    1. SVT (supraventricular tachycardia) (HCC)   2. Essential hypertension   3. Morbid obesity (HCC)   4. PVC (premature ventricular contraction)   5. Chronic respiratory failure with hypoxia (HCC)   6. Tobacco use    PLAN:    We had the patient hold her propanolol 20 mg twice a day.  What I am getting do is have her restart propanolol 10 mg daily at nighttime.  We will continue her flecainide 50 mg twice a day.  The patient understands the need to lose weight with diet and exercise. We have discussed specific strategies for this.  She actually has lost some  weight since I saw the patient in February.  Smoking cessation advised.   The patient is in agreement with the above plan. The patient left the office in stable condition.  The patient will follow up in 6 months or sooner if needed.   Medication Adjustments/Labs and Tests Ordered: Current medicines are reviewed at length with the patient today.  Concerns regarding medicines are outlined above.  Orders Placed This Encounter  Procedures   Basic metabolic panel   Magnesium   EKG 12-Lead   Meds ordered this encounter  Medications   DISCONTD: propranolol (INDERAL) 10 MG tablet    Sig: Take 1 tablet (10 mg total) by mouth 3 (three) times daily.    Dispense:  90 tablet    Refill:  3   propranolol (INDERAL) 10 MG tablet    Sig: Take 1 tablet (10 mg total) by mouth at bedtime.    Dispense:  90 tablet    Refill:  3    Patient Instructions  Medication Instructions:  Your physician has recommended you make the following change in your medication:  START PROPRANOLOL 10MG : TAKE ONE AT BED TIME  *If you need a refill on your cardiac medications before your next appointment, please call your pharmacy*   Lab Work: Your physician recommends that you return for lab work in: Today for Basic Metabolic Panel and Magnesium  If you have labs (blood work) drawn today and your tests are completely normal, you will receive your results only by: MyChart Message (if you have MyChart) OR A paper  copy in the mail If you have any lab test that is abnormal or we need to change your treatment, we will call you to review the results.   Testing/Procedures: NONE   Follow-Up: At [facility], you and your health needs are our priority.  As part of our continuing mission to provide you with exceptional heart care, we have created designated Provider Care Teams.  These Care Teams include your primary Cardiologist (physician) and Advanced Practice Providers (APPs -  Physician Assistants and Nurse  Practitioners) who all work together to provide you with the care you need, when you need it.  We recommend signing up for the patient portal called "MyChart".  Sign up information is provided on this After Visit Summary.  MyChart is used to connect with patients for Virtual Visits (Telemedicine).  Patients are able to view lab/test results, encounter notes, upcoming appointments, etc.  Non-urgent messages can be sent to your provider as well.   To learn more about what you can do with MyChart, go to [email or web address].    Your next appointment:   6 month(s)  The format for your next appointment:   In Person  Provider:   Dr. Jolyn LentKardi Shakayla Hickox @ [facility]   Other Instructions     Adopting a Healthy Lifestyle.  Know what a healthy weight is for you (roughly BMI <25) and aim to maintain this   Aim for 7+ servings of fruits and vegetables daily   65-80+ fluid ounces of water or unsweet tea for healthy kidneys   Limit to max 1 drink of alcohol per day; avoid smoking/tobacco   Limit animal fats in diet for cholesterol and heart health - choose grass fed whenever available   Avoid highly processed foods, and foods high in saturated/trans fats   Aim for low stress - take time to unwind and care for your mental health   Aim for 150 min of moderate intensity exercise weekly for heart health, and weights twice weekly for bone health   Aim for 7-9 hours of sleep daily   When it comes to diets, agreement about the perfect plan isnt easy to find, even among the experts. Experts at the [employer] developed an idea known as the Healthy Eating Plate. Just imagine a plate divided into logical, healthy portions.   The emphasis is on diet quality:   Load up on vegetables and fruits - one-half of your plate: Aim for color and variety, and remember that potatoes dont count.   Go for whole grains - one-quarter of your plate: Whole wheat, barley, wheat berries, quinoa, oats,  brown rice, and foods made with them. If you want pasta, go with whole wheat pasta.   Protein power - one-quarter of your plate: Fish, chicken, beans, and nuts are all healthy, versatile protein sources. Limit red meat.   The diet, however, does go beyond the plate, offering a few other suggestions.   Use healthy plant oils, such as olive, canola, soy, corn, sunflower and peanut. Check the labels, and avoid partially hydrogenated oil, which have unhealthy trans fats.   If youre thirsty, drink water. Coffee and tea are good in moderation, but skip sugary drinks and limit milk and dairy products to one or two daily servings.   The type of carbohydrate in the diet is more important than the amount. Some sources of carbohydrates, such as vegetables, fruits, whole grains, and beans-are healthier than others.   Finally, stay active  Signed, Lannah Koike, DO  10/15/2020 4:06 PM    [facility]

## 2020-10-15 NOTE — Patient Instructions (Signed)
Medication Instructions:  Your physician has recommended you make the following change in your medication:  START PROPRANOLOL 10MG : TAKE ONE AT BED TIME  *If you need a refill on your cardiac medications before your next appointment, please call your pharmacy*   Lab Work: Your physician recommends that you return for lab work in: Today for Basic Metabolic Panel and Magnesium  If you have labs (blood work) drawn today and your tests are completely normal, you will receive your results only by: MyChart Message (if you have MyChart) OR A paper copy in the mail If you have any lab test that is abnormal or we need to change your treatment, we will call you to review the results.   Testing/Procedures: NONE   Follow-Up: At [facility], you and your health needs are our priority.  As part of our continuing mission to provide you with exceptional heart care, we have created designated Provider Care Teams.  These Care Teams include your primary Cardiologist (physician) and Advanced Practice Providers (APPs -  Physician Assistants and Nurse Practitioners) who all work together to provide you with the care you need, when you need it.  We recommend signing up for the patient portal called "MyChart".  Sign up information is provided on this After Visit Summary.  MyChart is used to connect with patients for Virtual Visits (Telemedicine).  Patients are able to view lab/test results, encounter notes, upcoming appointments, etc.  Non-urgent messages can be sent to your provider as well.   To learn more about what you can do with MyChart, go to [facility].    Your next appointment:   6 month(s)  The format for your next appointment:   In Person  Provider:   [email or web address] @ [facility]   Other Instructions

## 2020-10-16 LAB — BASIC METABOLIC PANEL
BUN/Creatinine Ratio: 11 (ref 9–23)
BUN: 7 mg/dL (ref 6–24)
CO2: 30 mmol/L — ABNORMAL HIGH (ref 20–29)
Calcium: 9.7 mg/dL (ref 8.7–10.2)
Chloride: 100 mmol/L (ref 96–106)
Creatinine, Ser: 0.64 mg/dL (ref 0.57–1.00)
Glucose: 132 mg/dL — ABNORMAL HIGH (ref 65–99)
Potassium: 4.2 mmol/L (ref 3.5–5.2)
Sodium: 141 mmol/L (ref 134–144)
eGFR: 114 mL/min/{1.73_m2} (ref >59)

## 2020-10-16 LAB — MAGNESIUM: Magnesium: 2.2 mg/dL (ref 1.6–2.3)

## 2020-10-24 ENCOUNTER — Encounter: Payer: Self-pay | Admitting: Dietician

## 2020-10-24 ENCOUNTER — Encounter: Payer: Medicare Other | Attending: Cardiology | Admitting: Dietician

## 2020-10-24 ENCOUNTER — Other Ambulatory Visit: Payer: Self-pay

## 2020-10-24 DIAGNOSIS — E119 Type 2 diabetes mellitus without complications: Secondary | ICD-10-CM | POA: Diagnosis not present

## 2020-10-24 DIAGNOSIS — E785 Hyperlipidemia, unspecified: Secondary | ICD-10-CM | POA: Diagnosis not present

## 2020-10-24 DIAGNOSIS — Z8616 Personal history of COVID-19: Secondary | ICD-10-CM | POA: Insufficient documentation

## 2020-10-24 DIAGNOSIS — K219 Gastro-esophageal reflux disease without esophagitis: Secondary | ICD-10-CM | POA: Insufficient documentation

## 2020-10-24 DIAGNOSIS — Z6841 Body Mass Index (BMI) 40.0 and over, adult: Secondary | ICD-10-CM | POA: Insufficient documentation

## 2020-10-24 DIAGNOSIS — I10 Essential (primary) hypertension: Secondary | ICD-10-CM | POA: Insufficient documentation

## 2020-10-24 NOTE — Patient Instructions (Addendum)
Continue to increase your physical activity at your comfort level. Do your Youtube exercises twice a week on Saturday and Wednesday around 10:00 am for 30 minutes. Remember that ALL physical activity (cleaning, mowing the lawn, moving your body) counts!!  Increase your water intake to 64 oz per day.  Snack on veggies whenever you want! If dipping in ranch, choose a low fat/low calorie ranch dressing and dip lightly.  Have only 2 packs of Lance PB crackers on the days you are not exercising (5 days a week). You can have 3 packs for breakfast on the mornings that you are going to exercise (2 days a week)  Begin to build your meals using the proportions of the Balanced Plate. First, select your starch or grain for the meal,  Next, select your source of protein to pair with your starch or grain. Finally, complete the remaining half of your meal with a variety of non-starchy vegetables.

## 2020-10-24 NOTE — Progress Notes (Signed)
Medical Nutrition Therapy  Appointment Start time:  [phone]  Appointment End time:  [phone]  Primary concerns today: Weight loss, glycemic control.  Referral diagnosis: E66.01 - Morbid Obesity Preferred learning style: No preference indicated Learning readiness: Ready   NUTRITION ASSESSMENT   Anthropometrics  Ht: 5'9" Wt: 330.6 lbs Body mass index is 48.82 kg/m.    Clinical Medical Hx: HTN, HLD, GERD, Prediabetes, Asthma Medications: See list Labs: A1c - 6.6, LDL - 116 Notable Signs/Symptoms: N/A  Lifestyle & Dietary Hx Pt has been on supplemental oxygen since June, pt states they had COVID twice and their lungs haven't fully recovered. Pt reports history of prediabetes that has finally progressed into T2DM as of June. Pt works with a therapist (1x a week) and a peer support (2x a week) to manage stress.  Pt reports cleaning during the day to stay busy, sometimes too much and will throw things away without thinking. Pt reports beginning to try Youtube exercise videos, had to take a short break every few minutes but managed 30 minutes of exercise. Pt really enjoyed the exercise and how it made them feel.  Pt reports usually eating three meals. Breakfast meal is usually Lance PB crackers and coffee, 3 packs 5-6 days a week, 2 packs 1-2 days. Lunch usually contains a Boost glucose control and apple slices. Dinner is usually high in starches. Pt reports liking to snack on raw broccoli, cauliflower, and carrots dipped in ranch.  Food allergies: Oranges (Breaks out in a rash)  Estimated daily fluid intake: 50 oz Supplements: Daily MV, Vitamin D (50,000 IU), B-12 Sleep: OSA, uses CPAP Stress / self-care: Usually 6/10, family troubles caring for mother Current average weekly physical activity: ADL  24-Hr Dietary Recall First Meal: 3 packs of Lance PB crackers, black coffee Snack: none Second Meal: Apple slices, Boost Glucose control shake Snack: Few apple slices, 2 pieces of sliced  [place] Third Meal: Green peas, creamed corn, 4 slices of bread Snack: none Beverages: coffee, water   NUTRITION DIAGNOSIS  NB-1.1 Food and nutrition-related knowledge deficit As related to obesity.  As evidenced by BMI of 48.82 kg/m2, limited physical activity, and dinner meals high in starches..   NUTRITION INTERVENTION  Nutrition education (E-1) on the following topics:  Educated patient on the balanced plate eating model. Recommended lunch and dinner be 1/2 non-starchy vegetables, 1/4 starches, and 1/4 protein. Recommended breakfast be a balance of starch and protein with a piece of fruit. Discussed with patient the importance of working towards hitting the proportions of the balanced plate consistently.  Educated patient on factors that contribute to elevation of blood sugars, such as stress, illness, injury,and food choices. Discussed the role that physical activity plays in lowering blood sugar. Educate patient on the three main macronutrients. Protein, fats, and carbohydrates. Discussed how each of these macronutrients affect blood sugar levels, especially carbohydrate, and the importance of eating a consistent amount of carbohydrate throughout the day.    Handouts Provided Include  Balanced Plate Balanced Plate Food List  Learning Style & Readiness for Change Teaching method utilized: Visual & Auditory  Demonstrated degree of understanding via: Teach Back  Barriers to learning/adherence to lifestyle change: Limited capacity for physical activity  Goals Established by Pt Continue to increase your physical activity at your comfort level. Do your Youtube exercises twice a week on Saturday and Wednesday around 10:00 am for 30 minutes. Remember that ALL physical activity (cleaning, mowing the lawn, moving your body) counts!! Increase your water intake to  64 oz per day. Snack on veggies whenever you want! If dipping in ranch, choose a low fat/low calorie ranch dressing and dip  lightly. Have only 2 packs of Lance PB crackers on the days you are not exercising (5 days a week). You can have 3 packs for breakfast on the mornings that you are going to exercise (2 days a week) Begin to build your meals using the proportions of the Balanced Plate. First, select your starch or grain for the meal,  Next, select your source of protein to pair with your starch or grain. Finally, complete the remaining half of your meal with a variety of non-starchy vegetables.   MONITORING & EVALUATION Dietary intake, weekly physical activity, and weight in 6-8 weeks.  Next Steps  Patient is to follow with RD.

## 2020-10-26 ENCOUNTER — Telehealth: Payer: Self-pay | Admitting: Cardiology

## 2020-10-26 NOTE — Telephone Encounter (Signed)
Called patient. Patient made aware to stop Lasix per Dr. Servando Salina and maintain hydration. Verbalized understanding. No questions or concerns expressed at this time.

## 2020-10-26 NOTE — Telephone Encounter (Signed)
Pt c/o medication issue:  1. Name of Medication: propranolol  2. How are you currently taking this medication (dosage and times per day)? Once at night  3. Are you having a reaction (difficulty breathing--STAT)? No   4. What is your medication issue? BP staying low.  Pt c/o BP issue: STAT if pt c/o blurred vision, one-sided weakness or slurred speech  1. What are your last 5 BP readings? 91/71 HR 68  2. Are you having any other symptoms (ex. Dizziness, headache, blurred vision, passed out)? Dizziness light headed   3. What is your BP issue? Running low may be because of medication issue.

## 2020-11-09 ENCOUNTER — Ambulatory Visit: Payer: Medicare Other

## 2020-11-09 DIAGNOSIS — I471 Supraventricular tachycardia: Secondary | ICD-10-CM

## 2020-11-09 DIAGNOSIS — R55 Syncope and collapse: Secondary | ICD-10-CM

## 2020-11-09 NOTE — Progress Notes (Unsigned)
Enrolled patient for a 14 day Zio AT monitor to be mailed to patients home.  

## 2020-11-09 NOTE — Telephone Encounter (Signed)
Could you please help me mail her out a zio live. She can keep her October appt for now.  Pt advised and order placed for her live Zio patch.

## 2020-11-12 ENCOUNTER — Telehealth: Payer: Self-pay | Admitting: Cardiology

## 2020-11-12 ENCOUNTER — Ambulatory Visit: Payer: Medicare Other

## 2020-11-12 NOTE — Telephone Encounter (Signed)
   Pt is calling, she said she received her heart monitor today and she needs help to put in on. She also wanted to know how long she needs to wear her heart monitor

## 2020-11-12 NOTE — Telephone Encounter (Signed)
Spoke to the patient just now and let her know that she could bring this by the office and we will help her put it on. She verbalizes understanding.

## 2020-11-14 ENCOUNTER — Ambulatory Visit: Payer: Medicare Other | Admitting: Cardiology

## 2020-12-19 ENCOUNTER — Other Ambulatory Visit: Payer: Self-pay

## 2020-12-19 ENCOUNTER — Encounter: Payer: Medicare Other | Attending: Cardiology | Admitting: Dietician

## 2020-12-19 ENCOUNTER — Encounter: Payer: Self-pay | Admitting: Dietician

## 2020-12-19 DIAGNOSIS — I999 Unspecified disorder of circulatory system: Secondary | ICD-10-CM | POA: Diagnosis not present

## 2020-12-19 NOTE — Patient Instructions (Addendum)
Try mixing dry ranch seasoning with fat-free plain greek yogurt for a high protein vegetable dip.  Take your dog for a walk around town on the weekends for some physical activity.  Continue to have your Boost Glucose Control or Glucerna daily at lunch.  When snacking, start with a glass of cold water and consume your snack slowly. Make it a point to slow down!  Shop for healthier snacks for the month of October.  Keep some grapes, apples, berries, celery, broccoli, and carrots around and visible or easily accessible for when you feel the need to snack.  Consider trying "Squeeze Pack" apple sauce or baby food pouches for an easy, healthier snack.

## 2020-12-19 NOTE — Progress Notes (Signed)
Medical Nutrition Therapy  Appointment Start time:  0930  Appointment End time:  1000  Primary concerns today: Weight loss, glycemic control.  Referral diagnosis: E66.01 - Morbid Obesity Preferred learning style: No preference indicated Learning readiness: Ready   NUTRITION ASSESSMENT   Anthropometrics  Ht: 5'9" Wt: 348.2 lbs Wt Change: +18 lbs Body mass index is 51.42 kg/m.    Clinical Medical Hx: HTN, HLD, GERD, Prediabetes, Asthma Medications: See list Labs: A1c - 6.6, LDL - 116 Notable Signs/Symptoms: N/A   Lifestyle & Dietary Hx Pt reports going through a period of immense emotional distress for a few weeks in September. Pt reports eating a lot of junk food and sleeping a lot when they go through this distress. Pt reports doing better currently, but states October is a difficult month for them emotionally.  Pt recently experienced RUQ that was a diverticulosis flare up, states symptoms have improved. Pt is still on supplemental oxygen. Pt will see their pulmonologist in about a month and hopes to be taken off of it. Pt has been been taken off of Lasix and KlorCon. Pt states they will snack on grapes or celery between meals. Pt has switched to a low fat ranch to dip their veggies in. Meals tend to be low in protein and high in starches. Pt reports not doing much physical activity recently, but is agreeable to start walking with their dog around their neighborhood. RD provided 2 samples of Glucerna for patient. Exp Date: 07/15/2021  Food allergies: Oranges (Breaks out in a rash)  Estimated daily fluid intake: 50 oz Supplements: Daily MV, Vitamin D (50,000 IU), B-12 Sleep: OSA, uses CPAP Stress / self-care: Usually 6/10, family troubles caring for mother Current average weekly physical activity: ADL   24-Hr Dietary Recall First Meal: Black coffee, 2 packs of PB crackers Snack: none Second Meal: Black coffee, 2 packs PB crackers Snack: none Third Meal: Parmesan garlic  bites, Dr. Reino Kent Snack: none Beverages: coffee, water   NUTRITION DIAGNOSIS  NB-1.1 Food and nutrition-related knowledge deficit As related to obesity.  As evidenced by BMI of 48.82 kg/m2, limited physical activity, and dinner meals high in starches..   NUTRITION INTERVENTION  Nutrition education (E-1) on the following topics:  Educated patient on the balanced plate eating model. Recommended lunch and dinner be 1/2 non-starchy vegetables, 1/4 starches, and 1/4 protein. Recommended breakfast be a balance of starch and protein with a piece of fruit. Discussed with patient the importance of working towards hitting the proportions of the balanced plate consistently.  Educated patient on factors that contribute to elevation of blood sugars, such as stress, illness, injury,and food choices. Discussed the role that physical activity plays in lowering blood sugar. Educate patient on the three main macronutrients. Protein, fats, and carbohydrates. Discussed how each of these macronutrients affect blood sugar levels, especially carbohydrate, and the importance of eating a consistent amount of carbohydrate throughout the day.    Handouts Provided Include  Balanced Plate Balanced Plate Food List  Learning Style & Readiness for Change Teaching method utilized: Visual & Auditory  Demonstrated degree of understanding via: Teach Back  Barriers to learning/adherence to lifestyle change: Limited capacity for physical activity  Goals Established by Pt Try mixing dry ranch seasoning with fat-free plain greek yogurt for a high protein vegetable dip. Take your dog for a walk around town on the weekends for some physical activity. Continue to have your Boost Glucose Control or Glucerna daily at lunch. When snacking, start with a glass  of cold water and consume your snack slowly. Make it a point to slow down! Shop for healthier snacks for the month of October.  Keep some grapes, apples, berries, celery,  broccoli, and carrots around and visible or easily accessible for when you feel the need to snack.  Consider trying "Squeeze Pack" apple sauce or baby food pouches for an easy, healthier snack.   MONITORING & EVALUATION Dietary intake, weekly physical activity, and weight in 6-8 weeks.  Next Steps  Patient is to follow with RD.

## 2020-12-21 ENCOUNTER — Other Ambulatory Visit: Payer: Self-pay

## 2020-12-21 MED ORDER — FLECAINIDE ACETATE 50 MG PO TABS: 50.0000 mg | ORAL_TABLET | Freq: Two times a day (BID) | ORAL | 2 refills | Status: DC

## 2020-12-21 NOTE — Telephone Encounter (Signed)
Refill of Flecainide 50 mg sent to [facility]. 

## 2020-12-31 ENCOUNTER — Ambulatory Visit: Payer: Medicare Other | Admitting: Cardiology

## 2021-01-07 ENCOUNTER — Other Ambulatory Visit: Payer: Self-pay

## 2021-01-07 ENCOUNTER — Encounter: Payer: Self-pay | Admitting: Cardiology

## 2021-01-07 ENCOUNTER — Ambulatory Visit (INDEPENDENT_AMBULATORY_CARE_PROVIDER_SITE_OTHER): Payer: Medicare Other | Admitting: Cardiology

## 2021-01-07 VITALS — BP 112/68 | HR 76 | Ht 69.0 in | Wt 354.0 lb

## 2021-01-07 DIAGNOSIS — I471 Supraventricular tachycardia: Secondary | ICD-10-CM | POA: Diagnosis not present

## 2021-01-07 DIAGNOSIS — I10 Essential (primary) hypertension: Secondary | ICD-10-CM

## 2021-01-07 DIAGNOSIS — I493 Ventricular premature depolarization: Secondary | ICD-10-CM | POA: Diagnosis not present

## 2021-01-07 DIAGNOSIS — Z6841 Body Mass Index (BMI) 40.0 and over, adult: Secondary | ICD-10-CM

## 2021-01-07 DIAGNOSIS — I4729 Other ventricular tachycardia: Secondary | ICD-10-CM | POA: Diagnosis not present

## 2021-01-07 DIAGNOSIS — E782 Mixed hyperlipidemia: Secondary | ICD-10-CM

## 2021-01-07 DIAGNOSIS — R7303 Prediabetes: Secondary | ICD-10-CM

## 2021-01-07 MED ORDER — FUROSEMIDE 20 MG PO TABS: 20.0000 mg | ORAL_TABLET | ORAL | 2 refills | Status: DC

## 2021-01-07 MED ORDER — POTASSIUM CHLORIDE ER 10 MEQ PO TBCR: 10.0000 meq | EXTENDED_RELEASE_TABLET | Freq: Every day | ORAL | 3 refills | Status: DC

## 2021-01-07 MED ORDER — FUROSEMIDE 20 MG PO TABS: 20.0000 mg | ORAL_TABLET | Freq: Every day | ORAL | 3 refills | Status: DC

## 2021-01-07 MED ORDER — POTASSIUM CHLORIDE ER 10 MEQ PO TBCR: 10.0000 meq | EXTENDED_RELEASE_TABLET | ORAL | 2 refills | Status: DC

## 2021-01-07 NOTE — Progress Notes (Signed)
Cardiology Office Note:    Date:  01/07/2021   ID:  Ellen Shaw, DOB 1980/07/26, MRN [id number]  PCP:  Marlyn Corporal, PA  Cardiologist:  Thomasene Ripple, DO  Electrophysiologist:  None   Referring MD: Marlyn Corporal, PA   " I am doing well"  History of Present Illness:    Ellen Shaw is a 40 y.o. female with a hx of chronic respiratory failure now on oxygen managed by her primary care doctor, hypertension, morbid obesity, nonsustained ventricular tachycardia on the monitor, recurrent palpitations here today for follow-up visit.   I saw the patient 1 March 20, 2019 at that time she had some chest pain and based on her I did have some multiple episodes of nonsustained ventricular tachycardia I recommended she undergo a coronary CTA to rule out coronary artery disease.   I saw the patient on April 12, 2019 we discussed her CT results which showed 0 calcium with no evidence of coronary artery disease.  She did tell me that her palpitations were not being helped by the Cardizem.  We stopped the Cardizem and start the patient on propanolol 20 mg at nighttime and flecainide 50 mg twice a day.   I saw the patient on October 15, 2020 at that time she had she started oxygen.  She was doing well with this.  During that time we restarted her propanolol 10 mg daily due to low blood pressure and we kept her on the flecainide twice a day.  Today she tells me that other than her leg swelling she is doing well.  She has not had any significant episode of palpitations.  No other complaints at this time.  Past Medical History:  Diagnosis Date   Alcohol abuse 02/21/2014   Allergic rhinitis 01/31/2008   Qualifier: Diagnosis of  By: Truman Hayward (Duncan Dull), Epifanio Lesches of this note might be different from the original. Overview:  Qualifier: Diagnosis of  By: Truman Hayward Duncan Dull), Susanne   Anxiety 07/22/2016   Arthritis 07/22/2016   Formatting of this note might be different from the original. Knees  Formatting of this note might be different from the original. Overview:  Knees   Asthma 01/31/2008   Qualifier: Diagnosis of  By: Truman Hayward Duncan Dull), Epifanio Lesches of this note might be different from the original. Overview:  Qualifier: Diagnosis of  By: Truman Hayward Duncan Dull), Lynnae Sandhoff   ASTHMA 01/31/2008   Qualifier: Diagnosis of  By: Truman Hayward Duncan Dull), Susanne     Bell's palsy    age 15-14   Bilateral leg edema 02/15/2020   Body mass index (BMI) of 50.0 to 59.9 in adult [facility]) 01/06/2017   Chest discomfort 03/19/2020   Complex atypical endometrial hyperplasia 08/01/2013   Constipation 06/28/2018   DYSPNEA 01/31/2008   Qualifier: Diagnosis of  By: Vassie Loll MD, [place] Locket.     Episodic mood disorder (HCC) 02/04/2012   Essential hypertension 02/03/2011   Folliculitis 10/12/2017   GERD (gastroesophageal reflux disease) 12/03/2012   History of Helicobacter pylori infection 03/03/2017   Hx of migraines 02/13/2020   Hyperlipidemia    Hypertension    Intertrigo 09/01/2017   Lumbosacral radiculopathy 09/01/2013   Major depressive disorder, recurrent episode (HCC) 05/02/2014   Medication overuse headache 07/27/2014   Migraine with aura and without status migrainosus, not intractable 07/27/2014   Formatting of this note might be different from the original. Follows with Dr. Weston Settle in Neurology. HX pseudotumor cerebri   Mixed stress and urge urinary  incontinence 08/12/2013   Morbid obesity (HCC) 02/15/2020   Multiple allergies 02/13/2020   NSVT (nonsustained ventricular tachycardia) 03/19/2020   Obstructive sleep apnea on CPAP 12/27/2010   Formatting of this note might be different from the original. Uses Cpap  Formatting of this note might be different from the original. AHI=21.8 AutoPAP at 15-20 with an EPR=3  Last Assessment & Plan:  Formatting of this note might be different from the original. 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 Relevant Hx: Course: Daily Update: Today's Plan:  Electronically signed by: Seward Speck, MD Resident 12/22/14 [phone]   Prediabetes 06/30/2017   Formatting of this note might be different from the original. A1C 6.1 on 06/2017   Primary hypertension 02/15/2020   Primary osteoarthritis of right knee 10/12/2012   Pseudotumor    PVC (premature ventricular contraction) 03/19/2020   Right bundle branch block 02/15/2020   Right cervical radiculopathy 07/18/2014   Sleep apnea    SVT (supraventricular tachycardia) (HCC) 07/15/2016   Tobacco use 12/03/2011   Umbilical cyst 06/02/2011   Urinary retention 08/19/2013   Formatting of this note might be different from the original. S/P interstim placement with Urology   Vulvovaginal candidiasis 03/03/2017    Past Surgical History:  Procedure Laterality Date   BLADDER SURGERY     TONSILLECTOMY AND ADENOIDECTOMY     VAGINAL HYSTERECTOMY  2015    Current Medications: Current Meds  Medication Sig   albuterol (VENTOLIN HFA) 108 (90 Base) MCG/ACT inhaler every 4 (four) hours as needed for wheezing or shortness of breath. Every 4-6 hours as needed   Cholecalciferol (VITAMIN D3) 1.25 MG (50000 UT) CAPS Take 1 capsule by mouth once a week.   EPINEPHrine 0.3 mg/0.3 mL IJ SOAJ injection Inject into the muscle as needed for anaphylaxis.   flecainide (TAMBOCOR) 50 MG tablet Take 1 tablet (50 mg total) by mouth 2 (two) times daily.   linaclotide (LINZESS) 290 MCG CAPS capsule Take 290 mcg by mouth daily before breakfast.   loratadine (CLARITIN) 10 MG tablet Take 10 mg by mouth daily.   Multiple Vitamin (MULTI-VITAMIN) tablet Take 1 tablet by mouth daily.   promethazine (PHENERGAN) 12.5 MG tablet Take 12.5 mg  by mouth every 6 (six) hours as needed for nausea or vomiting.   propranolol (INDERAL) 10 MG tablet Take 1 tablet (10 mg total) by mouth at bedtime.   risperiDONE (RISPERDAL) 1 MG tablet Take 1 tablet by mouth at bedtime.   Sertraline HCl 150 MG CAPS Take 150 mg by mouth daily.   traZODone (DESYREL) 50 MG tablet Take by mouth at bedtime as needed for sleep.   Triamcinolone Acetonide (NASACORT AQ NA) Place into the nose.   Umeclidinium-Vilanterol (ANORO ELLIPTA IN) Inhale into the lungs.   vitamin B-12 (CYANOCOBALAMIN) 1000 MCG tablet Take 1,000 mcg by mouth daily.   [DISCONTINUED] furosemide (LASIX) 20 MG tablet Take 1 tablet (20 mg total) by mouth daily.   [DISCONTINUED] potassium chloride (KLOR-CON) 10 MEQ tablet Take 1 tablet (10 mEq total) by mouth daily.   [DISCONTINUED] RISPERDAL CONSTA 25 MG injection Inject into the muscle every 14 (fourteen) days.     Allergies:  Ciprofloxacin, Clindamycin, Esomeprazole magnesium, Nitrofurantoin, Oxycodone, Sulfa antibiotics, Sulfamethoxazole-trimethoprim, Trimethoprim, Bee pollen, Citrus, Doxycycline, Fluticasone, Penicillins, Pollen extract, Bee venom, Buspirone, Esomeprazole magnesium, Fentanyl, Flonase [fluticasone propionate], Fluticasone propionate, Lexapro [escitalopram], Paliperidone, Pantoprazole, and Orange oil   Social History   Socioeconomic History   Marital status: Single    Spouse name: Not on file   Number of children: Not on file   Years of education: Not on file   Highest education level: Not on file  Occupational History   Not on file  Tobacco Use   Smoking status: Former    Types: Cigarettes    Quit date: 01/19/2020    Years since quitting: 0.9   Smokeless tobacco: Never  Substance and Sexual Activity   Alcohol use: Not Currently   Drug use: No   Sexual activity: Not on file  Other Topics Concern   Not on file  Social History Narrative   Not on file   Social Determinants of Health   Financial Resource Strain: Not  on file  Food Insecurity: Not on file  Transportation Needs: Not on file  Physical Activity: Not on file  Stress: Not on file  Social Connections: Not on file     Family History: The patient's family history includes Atrial fibrillation in her father; COPD in her father; Congestive Heart Failure in her father; Glaucoma in her father; Heart attack in her maternal grandmother; High Cholesterol in her mother; Hypertension in her father; Kidney disease in her father; Lung cancer in her father and paternal grandmother; Macular degeneration in her father; Stroke in her father.  ROS:   Review of Systems  Constitution: Negative for decreased appetite, fever and weight gain.  HENT: Negative for congestion, ear discharge, hoarse voice and sore throat.   Eyes: Negative for discharge, redness, vision loss in right eye and visual halos.  Cardiovascular: Negative for chest pain, dyspnea on exertion, leg swelling, orthopnea and palpitations.  Respiratory: Negative for cough, hemoptysis, shortness of breath and snoring.   Endocrine: Negative for heat intolerance and polyphagia.  Hematologic/Lymphatic: Negative for bleeding problem. Does not bruise/bleed easily.  Skin: Negative for flushing, nail changes, rash and suspicious lesions.  Musculoskeletal: Negative for arthritis, joint pain, muscle cramps, myalgias, neck pain and stiffness.  Gastrointestinal: Negative for abdominal pain, bowel incontinence, diarrhea and excessive appetite.  Genitourinary: Negative for decreased libido, genital sores and incomplete emptying.  Neurological: Negative for brief paralysis, focal weakness, headaches and loss of balance.  Psychiatric/Behavioral: Negative for altered mental status, depression and suicidal ideas.  Allergic/Immunologic: Negative for HIV exposure and persistent infections.    EKGs/Labs/Other Studies Reviewed:    The following studies were reviewed today:   EKG:  The ekg ordered today demonstrates  sinus rhythm, heart rate 76 bpm with underlying right bundle branch block.   CCTA  Aorta: Normal size.  No calcifications.  No dissection.   Aortic Valve:  Trileaflet.  No calcifications.   Coronary Arteries:  Normal coronary origin.  Right dominance.   RCA is a large dominant artery that gives rise to PDA and PLVB. There is no plaque.   Left main is a large artery that gives rise to LAD and LCX arteries.   LAD is a large vessel that has no plaque.   LCX is a non-dominant artery that gives rise to one large OM1 branch. There is no plaque.   Other findings:   Normal pulmonary vein drainage into the left atrium.   Normal left atrial appendage without a thrombus.  Normal size of the pulmonary artery.   IMPRESSION: 1. Coronary calcium score of 0. This was 0 percentile for age and sex matched control.   2. Normal coronary origin with right dominance.   3. No evidence of CAD.     Zio monitor The patient wore the monitor for 14 days starting February 15, 2020   . Indication: Palpitations   The minimum heart rate was 47 bpm, maximum heart rate was 240 bpm, and average heart rate was 85 bpm. Predominant underlying rhythm was Sinus Rhythm.  First-degree AV block was present.   9 Ventricular Tachycardia runs occurred, the run with the fastest interval lasting 5 beats with a maximum rate of 240 bpm, the longest lasting 5 beats with an average rate of 184 bpm.   Premature atrial complexes were rare less than 1%. Premature Ventricular complexes rare less than 1%.   No pauses, No AV block and no atrial fibrillation present. 14 patient triggered events and 13 diary events associated with premature ventricular complex.   Conclusion: This study is remarkable for the following:                             1.  Nonsustained ventricular tachycardia                             2.  Rare symptomatic premature ventricular complex.   Transthoracic Echocardiogram IMPRESSIONS  1. Left  ventricular ejection fraction, by estimation, is 60 to 65%. The left ventricle has normal function. The left ventricle has no regional wall motion abnormalities. Left ventricular diastolic parameters were normal.   2. Right ventricular systolic function is normal. The right ventricular size is normal.   3. The mitral valve is normal in structure. No evidence of mitral valve regurgitation. No evidence of mitral stenosis.   4. The aortic valve is normal in structure. Aortic valve regurgitation is not visualized. No aortic stenosis is present.   5. The inferior vena cava is normal in size with greater than 50% respiratory variability, suggesting right atrial pressure of 3 mmHg.   FINDINGS   Left Ventricle: Left ventricular ejection fraction, by estimation, is 60 to 65%. The left ventricle has normal function. The left ventricle has no regional wall motion abnormalities. Definity contrast agent was given IV to delineate the left ventricular   endocardial borders. The left ventricular internal cavity size was normal in size. There is no left ventricular hypertrophy. Left ventricular diastolic parameters were normal.   Right Ventricle: The right ventricular size is normal. No increase in right ventricular wall thickness. Right ventricular systolic function is  normal.   Left Atrium: Left atrial size was normal in size.   Right Atrium: Right atrial size was normal in size.   Pericardium: There is no evidence of pericardial effusion.   Mitral Valve: The mitral valve is normal in structure. No evidence of  mitral valve regurgitation. No evidence of mitral valve stenosis.   Tricuspid Valve: The tricuspid valve is normal in structure. Tricuspid  valve regurgitation is trivial. No evidence of tricuspid stenosis.   Aortic Valve: The aortic valve is normal in structure. Aortic valve  regurgitation is not visualized. No aortic stenosis is present.   Pulmonic Valve: The pulmonic valve was normal in  structure. Pulmonic valve  regurgitation is not visualized. No evidence of pulmonic stenosis.   Aorta: The aortic root is normal in  size and structure.   Venous: The inferior vena cava is normal in size with greater than 50%  respiratory variability, suggesting right atrial pressure of 3 mmHg.   IAS/Shunts: No atrial level shunt detected by color flow Doppler.   ZIO monitor 2022 Patch Wear Time:  13 days and 14 hours starting November 12, 2020. Indication palpitation   Patient had a min HR of 51 bpm, max HR of 158 bpm, and avg HR of 78 bpm. Predominant underlying rhythm was Sinus Rhythm. First Degree AV Block was present.    8 Supraventricular Tachycardia runs occurred, the run with the fastest interval lasting 5 beats with a max rate of 158 bpm (avg 128 bpm); the run with the fastest interval was  also the longest. Isolated SVEs were rare (<1.0%), SVE Couplets were rare (<1.0%), and SVE Triplets were rare (<1.0%). Isolated VEs were rare (<1.0%), VE Couplets were rare (<1.0%), and no VE Triplets were present. Ventricular Bigeminy and Trigeminy  were present.   Symptoms were associated with sinus rhythm and PVC.   No atrial fibrillation.   Conclusion: This study is remarkable for paroxysmal supraventricular tachycardia.  Recent Labs: 10/15/2020: BUN 7; Creatinine, Ser 0.64; Magnesium 2.2; Potassium 4.2; Sodium 141  Recent Lipid Panel No results found for: CHOL, TRIG, HDL, CHOLHDL, VLDL, LDLCALC, LDLDIRECT  Physical Exam:    VS:  BP 112/68   Pulse 76   Ht  (1.753 m)   Wt (!) 354 lb (160.6 kg)   SpO2 97%   BMI 52.28 kg/m     Wt Readings from Last 3 Encounters:  01/07/21 (!) 354 lb (160.6 kg)  12/19/20 (!) 348 lb 3.2 oz (157.9 kg)  10/24/20 (!) 330 lb 9.6 oz (150 kg)     GEN: Well nourished, well developed in no acute distress HEENT: Normal NECK: No JVD; No carotid bruits LYMPHATICS: No lymphadenopathy CARDIAC: S1S2 noted,RRR, no murmurs, rubs, gallops RESPIRATORY:   Clear to auscultation without rales, wheezing or rhonchi  ABDOMEN: Soft, non-tender, non-distended, +bowel sounds, no guarding. EXTREMITIES: No edema, No cyanosis, no clubbing MUSCULOSKELETAL:  No deformity  SKIN: Warm and dry NEUROLOGIC:  Alert and oriented x 3, non-focal PSYCHIATRIC:  Normal affect, good insight  ASSESSMENT:    1. NSVT (nonsustained ventricular tachycardia)   2. PVC (premature ventricular contraction)   3. Essential hypertension   4. SVT (supraventricular tachycardia) (HCC)   5. Body mass index (BMI) of 50.0 to 59.9 in adult (HCC)   6. Prediabetes   7. Mixed hyperlipidemia    PLAN:    We discussed her monitor result.  For now organ to keep the patient on her current dose of flecainide as well as propanolol.  She is have significant leg swelling so we can start back with gentle diuretics to Lasix 20 mg once a week to help with the fluid increase she is gaining some weight which I do suspect is due in the setting of her increased leg edema.  Blood pressure is acceptable, continue with current antihypertensive regimen. Continue chronic oxygen per PCP and pulmonary doctor. The patient understands the need to lose weight with diet and exercise. We have discussed specific strategies for this.   The patient is in agreement with the above plan. The patient left the office in stable condition.  The patient will follow up in   Medication Adjustments/Labs and Tests Ordered: Current medicines are reviewed at length with the patient today.  Concerns regarding medicines are outlined above.  Orders Placed This  Encounter  Procedures   EKG 12-Lead   Meds ordered this encounter  Medications   DISCONTD: furosemide (LASIX) 20 MG tablet    Sig: Take 1 tablet (20 mg total) by mouth daily.    Dispense:  90 tablet    Refill:  3   DISCONTD: potassium chloride (KLOR-CON) 10 MEQ tablet    Sig: Take 1 tablet (10 mEq total) by mouth daily.    Dispense:  90 tablet    Refill:  3    furosemide (LASIX) 20 MG tablet    Sig: Take 1 tablet (20 mg total) by mouth once a week.    Dispense:  18 tablet    Refill:  2   potassium chloride (KLOR-CON) 10 MEQ tablet    Sig: Take 1 tablet (10 mEq total) by mouth once a week.    Dispense:  18 tablet    Refill:  2    Patient Instructions  Medication Instructions:  Your physician has recommended you make the following change in your medication:  DECREASE: Lasix 20 mg once weekly DECREASE: Potassium 10 mg once weekly *If you need a refill on your cardiac medications before your next appointment, please call your pharmacy*   Lab Work: None If you have labs (blood work) drawn today and your tests are completely normal, you will receive your results only by: MyChart Message (if you have MyChart) OR A paper copy in the mail If you have any lab test that is abnormal or we need to change your treatment, we will call you to review the results.   Testing/Procedures: None   Follow-Up: At [facility], you and your health needs are our priority.  As part of our continuing mission to provide you with exceptional heart care, we have created designated Provider Care Teams.  These Care Teams include your primary Cardiologist (physician) and Advanced Practice Providers (APPs -  Physician Assistants and Nurse Practitioners) who all work together to provide you with the care you need, when you need it.  We recommend signing up for the patient portal called "MyChart".  Sign up information is provided on this After Visit Summary.  MyChart is used to connect with patients for Virtual Visits (Telemedicine).  Patients are able to view lab/test results, encounter notes, upcoming appointments, etc.  Non-urgent messages can be sent to your provider as well.   To learn more about what you can do with MyChart, go to [email or web address].    Your next appointment:   1 year(s)  The format for your next appointment:   In Person  Provider:    Thomasene Ripple, DO [address], [place], [place] [address]    Other Instructions     Adopting a Healthy Lifestyle.  Know what a healthy weight is for you (roughly BMI <25) and aim to maintain this   Aim for 7+ servings of fruits and vegetables daily   65-80+ fluid ounces of water or unsweet tea for healthy kidneys   Limit to max 1 drink of alcohol per day; avoid smoking/tobacco   Limit animal fats in diet for cholesterol and heart health - choose grass fed whenever available   Avoid highly processed foods, and foods high in saturated/trans fats   Aim for low stress - take time to unwind and care for your mental health   Aim for 150 min of moderate intensity exercise weekly for heart health, and weights twice weekly for bone health   Aim for 7-9 hours of sleep daily  When it comes to diets, agreement about the perfect plan isnt easy to find, even among the experts. Experts at the [facility] of [employer] developed an idea known as the Healthy Eating Plate. Just imagine a plate divided into logical, healthy portions.   The emphasis is on diet quality:   Load up on vegetables and fruits - one-half of your plate: Aim for color and variety, and remember that potatoes dont count.   Go for whole grains - one-quarter of your plate: Whole wheat, barley, wheat berries, quinoa, oats, brown rice, and foods made with them. If you want pasta, go with whole wheat pasta.   Protein power - one-quarter of your plate: Fish, chicken, beans, and nuts are all healthy, versatile protein sources. Limit red meat.   The diet, however, does go beyond the plate, offering a few other suggestions.   Use healthy plant oils, such as olive, canola, soy, corn, sunflower and peanut. Check the labels, and avoid partially hydrogenated oil, which have unhealthy trans fats.   If youre thirsty, drink water. Coffee and tea are good in moderation, but skip sugary drinks and limit milk and dairy  products to one or two daily servings.   The type of carbohydrate in the diet is more important than the amount. Some sources of carbohydrates, such as vegetables, fruits, whole grains, and beans-are healthier than others.   Finally, stay active  Signed, Thomasene Ripple, DO  01/07/2021 4:05 PM    [facility]

## 2021-01-07 NOTE — Patient Instructions (Addendum)
Medication Instructions:  Your physician has recommended you make the following change in your medication:  DECREASE: Lasix 20 mg once weekly DECREASE: Potassium 10 mg once weekly *If you need a refill on your cardiac medications before your next appointment, please call your pharmacy*   Lab Work: None If you have labs (blood work) drawn today and your tests are completely normal, you will receive your results only by: MyChart Message (if you have MyChart) OR A paper copy in the mail If you have any lab test that is abnormal or we need to change your treatment, we will call you to review the results.   Testing/Procedures: None   Follow-Up: At [facility], you and your health needs are our priority.  As part of our continuing mission to provide you with exceptional heart care, we have created designated Provider Care Teams.  These Care Teams include your primary Cardiologist (physician) and Advanced Practice Providers (APPs -  Physician Assistants and Nurse Practitioners) who all work together to provide you with the care you need, when you need it.  We recommend signing up for the patient portal called "MyChart".  Sign up information is provided on this After Visit Summary.  MyChart is used to connect with patients for Virtual Visits (Telemedicine).  Patients are able to view lab/test results, encounter notes, upcoming appointments, etc.  Non-urgent messages can be sent to your provider as well.   To learn more about what you can do with MyChart, go to [email or web address].    Your next appointment:   1 year(s)  The format for your next appointment:   In Person  Provider:   Thomasene Ripple, DO [address], [place], [place] [address]    Other Instructions

## 2021-01-16 ENCOUNTER — Encounter: Payer: Self-pay | Admitting: Pulmonary Disease

## 2021-01-16 ENCOUNTER — Other Ambulatory Visit: Payer: Self-pay

## 2021-01-16 ENCOUNTER — Ambulatory Visit (INDEPENDENT_AMBULATORY_CARE_PROVIDER_SITE_OTHER): Payer: Medicare Other | Admitting: Pulmonary Disease

## 2021-01-16 VITALS — BP 126/78 | HR 97 | Ht 69.0 in | Wt 347.2 lb

## 2021-01-16 DIAGNOSIS — R0602 Shortness of breath: Secondary | ICD-10-CM

## 2021-01-16 DIAGNOSIS — J9611 Chronic respiratory failure with hypoxia: Secondary | ICD-10-CM | POA: Diagnosis not present

## 2021-01-16 DIAGNOSIS — E662 Morbid (severe) obesity with alveolar hypoventilation: Secondary | ICD-10-CM

## 2021-01-16 NOTE — Patient Instructions (Addendum)
Continue supplemental oxygen for now. Your need for oxygen could be related to obesity hypoventilation syndrome vs scarring or fibrosis from your covid infections along with your history of smoking.   We will have you follow up in 4 weeks with pulmonary function tests  We will obtain a high resolution CT chest scan in the next couple of weeks

## 2021-01-16 NOTE — Progress Notes (Signed)
Synopsis: Referred in November 2022 for chronic hypoxemic respiratory failure by Graylon Gunning, PA  Subjective:   PATIENT ID: Ellen Shaw GENDER: female DOB: June 16, 1980, MRN: [id number]   HPI  Chief Complaint  Patient presents with   Consult    SOB- during exertion and trouble keeping o2 levels up, afte May 2022   Ellen Shaw is a 40 year old woman, former smoker with obesity, chronic hypoxemic respiratory failure and obstructive sleep apnea on cpap who is referred to pulmonary clinic for evaluation of her respiratory failure.   She was started on oxygen earlier this year by her primary care team due to SpO2 readings in the mid 80s on room air. She is currently on 1-2L of supplemental oxygen. She is using CPAP therapy at night and is followed at [facility] for her sleep apnea.   The oxygen need is new since being sick with covid 19 in 11/2019 and again 07/2020.   She had pulmonary function tests performed 07/05/20 which showed FEV1 2.58L (74%), FVC 3.12L (72%), ratio 83, TLC 4.14L (71%), DLCO 30.99 (74%).  She was evaluated by Dr. Eual Fines of pulmonary at [facility] on 07/12/20. She was referred to pulmonary rehab at [facility] and is waiting to be enrolled in this program. They trialed her on anoro ellipta but she did not tolerate this medicine due to chest discomfort. They recommended weight loss.  She is seeing a nutritionist at this time to work on weight loss.   She quit smoking 04/2020 and smoked 1-2 packs over the past 10 years.   Past Medical History:  Diagnosis Date   Alcohol abuse 02/21/2014   Allergic rhinitis 01/31/2008   Qualifier: Diagnosis of  By: Truman Hayward (Duncan Dull), Epifanio Lesches of this note might be different from the original. Overview:  Qualifier: Diagnosis of  By: Truman Hayward Duncan Dull), Susanne   Anxiety 07/22/2016   Arthritis 07/22/2016   Formatting of this note might be different from the original. Knees Formatting of this note might be different  from the original. Overview:  Knees   Asthma 01/31/2008   Qualifier: Diagnosis of  By: Truman Hayward Duncan Dull), Epifanio Lesches of this note might be different from the original. Overview:  Qualifier: Diagnosis of  By: Truman Hayward Duncan Dull), Lynnae Sandhoff   ASTHMA 01/31/2008   Qualifier: Diagnosis of  By: Truman Hayward Duncan Dull), Susanne     Bell's palsy    age 50-14   Bilateral leg edema 02/15/2020   Body mass index (BMI) of 50.0 to 59.9 in adult [facility]) 01/06/2017   Chest discomfort 03/19/2020   Complex atypical endometrial hyperplasia 08/01/2013   Constipation 06/28/2018   DYSPNEA 01/31/2008   Qualifier: Diagnosis of  By: Vassie Loll MD, [place].     Episodic mood disorder (HCC) 02/04/2012   Essential hypertension 02/03/2011   Folliculitis 10/12/2017   GERD (gastroesophageal reflux disease) 12/03/2012   History of Helicobacter pylori infection 03/03/2017   Hx of migraines 02/13/2020   Hyperlipidemia    Hypertension    Intertrigo 09/01/2017   Lumbosacral radiculopathy 09/01/2013   Major depressive disorder, recurrent episode (HCC) 05/02/2014   Medication overuse headache 07/27/2014   Migraine with aura and without status migrainosus, not intractable 07/27/2014   Formatting of this note might be different from the original. Follows with Dr. Weston Settle in Neurology. HX pseudotumor cerebri   Mixed stress and urge urinary incontinence 08/12/2013   Morbid obesity (HCC) 02/15/2020   Multiple allergies 02/13/2020   NSVT (nonsustained ventricular  tachycardia) 03/19/2020   Obstructive sleep apnea on CPAP 12/27/2010   Formatting of this note might be different from the original. Uses Cpap  Formatting of this note might be different from the original. AHI=21.8 AutoPAP at 15-20 with an EPR=3  Last Assessment & Plan:  Formatting of this note might be different from the original. 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 Relevant Hx: Course: Daily Update: Today's Plan:  Electronically signed by: Seward Speck, MD Resident 12/22/14 [phone]   Prediabetes 06/30/2017   Formatting of this note might be different from the original. A1C 6.1 on 06/2017   Primary hypertension 02/15/2020   Primary osteoarthritis of right knee 10/12/2012   Pseudotumor    PVC (premature ventricular contraction) 03/19/2020   Right bundle branch block 02/15/2020   Right cervical radiculopathy 07/18/2014   Sleep apnea    SVT (supraventricular tachycardia) (HCC) 07/15/2016   Tobacco use 12/03/2011   Umbilical cyst 06/02/2011   Urinary retention 08/19/2013   Formatting of this note might be different from the original. S/P interstim placement with Urology   Vulvovaginal candidiasis 03/03/2017     Family History  Problem Relation Age of Onset   COPD Father    Lung cancer Father    Congestive Heart Failure Father    Hypertension Father    Kidney disease Father    Atrial fibrillation Father    Macular degeneration Father    Glaucoma Father    Stroke Father    High Cholesterol Mother    Heart attack Maternal Grandmother    Lung cancer Paternal Grandmother      Social History   Socioeconomic History   Marital status: Single    Spouse name: Not on file   Number of children: Not on file   Years of education: Not on file   Highest education level: Not on file  Occupational History   Not on file  Tobacco Use   Smoking status: Former    Types: Cigarettes    Quit date: 01/19/2020    Years since quitting: 0.9   Smokeless tobacco: Never  Substance and Sexual Activity   Alcohol use: Not Currently   Drug use: No   Sexual activity: Not on file  Other Topics Concern   Not on file  Social History Narrative   Not on file    Social Determinants of Health   Financial Resource Strain: Not on file  Food Insecurity: Not on file  Transportation Needs: Not on file  Physical Activity: Not on file  Stress: Not on file  Social Connections: Not on file  Intimate Partner Violence: Not on file     Allergies  Allergen Reactions   Ciprofloxacin Anaphylaxis, Rash and Shortness Of Breath    SOB    Clindamycin Shortness Of Breath   Esomeprazole Magnesium Hives and Shortness Of Breath   Nitrofurantoin Shortness Of Breath   Oxycodone Other (See Comments)    Other: "I passed out." Other: convulsions "shaking" Other reaction(s): Dizziness, Hypotension, Wheezing (ALLERGY/intolerance)     Sulfa Antibiotics Rash    Other reaction(s): Rash, Shortness of breath    Sulfamethoxazole-Trimethoprim Rash and Shortness Of Breath   Trimethoprim  Other reaction(s): Rash, Rash, Shortness of breath   Bee Pollen Other (See Comments)    Other reaction(s): Cough, Cough (ALLERGY/intolerance) Other-sneeze sneeze   Citrus Hives   Doxycycline Other (See Comments)    Muscle spasms  Bad muscle spasms    Fluticasone Other (See Comments) and Rash    headache Other reaction(s): Other (See Comments) "Headache and nosebleed" headache    Penicillins Rash   Pollen Extract Other (See Comments)    Stuffy nose Other-sneeze     Bee Venom Swelling    Shortness of breathing   Buspirone    Esomeprazole Magnesium    Fentanyl Other (See Comments)    "I pass out." Fainting  States "I passed out." fainting     Flonase [Fluticasone Propionate]     "Headache and nosebleed"   Fluticasone Propionate    Lexapro [Escitalopram]    Paliperidone Other (See Comments)    Other reaction(s): Dystonia   Pantoprazole Hives    Relieved with Benadryl   Orange Oil Rash     Outpatient Medications Prior to Visit  Medication Sig Dispense Refill   albuterol (VENTOLIN HFA) 108 (90 Base) MCG/ACT inhaler every 4 (four) hours as needed for  wheezing or shortness of breath. Every 4-6 hours as needed     Cholecalciferol (VITAMIN D3) 1.25 MG (50000 UT) CAPS Take 1 capsule by mouth once a week.     EPINEPHrine 0.3 mg/0.3 mL IJ SOAJ injection Inject into the muscle as needed for anaphylaxis.     flecainide (TAMBOCOR) 50 MG tablet Take 1 tablet (50 mg total) by mouth 2 (two) times daily. 180 tablet 2   furosemide (LASIX) 20 MG tablet Take 1 tablet (20 mg total) by mouth once a week. 18 tablet 2   linaclotide (LINZESS) 290 MCG CAPS capsule Take 290 mcg by mouth daily before breakfast.     loratadine (CLARITIN) 10 MG tablet Take 10 mg by mouth daily.     Multiple Vitamin (MULTI-VITAMIN) tablet Take 1 tablet by mouth daily.     potassium chloride (KLOR-CON) 10 MEQ tablet Take 1 tablet (10 mEq total) by mouth once a week. 18 tablet 2   promethazine (PHENERGAN) 12.5 MG tablet Take 12.5 mg by mouth every 6 (six) hours as needed for nausea or vomiting.     propranolol (INDERAL) 10 MG tablet Take 1 tablet (10 mg total) by mouth at bedtime. 90 tablet 3   risperiDONE (RISPERDAL) 1 MG tablet Take 1 tablet by mouth at bedtime.     Sertraline HCl 150 MG CAPS Take 150 mg by mouth daily.     traZODone (DESYREL) 50 MG tablet Take by mouth at bedtime as needed for sleep.     Triamcinolone Acetonide (NASACORT AQ NA) Place into the nose.     vitamin B-12 (CYANOCOBALAMIN) 1000 MCG tablet Take 1,000 mcg by mouth daily.     Umeclidinium-Vilanterol (ANORO ELLIPTA IN) Inhale into the lungs. (Patient not taking: Reported on 01/16/2021)     No facility-administered medications prior to visit.    Review of Systems  Constitutional:  Negative for chills, fever, malaise/fatigue and weight loss.  HENT:  Negative for congestion, sinus pain and sore throat.   Eyes: Negative.   Respiratory:  Negative for cough, hemoptysis, sputum production, shortness of breath and wheezing.   Cardiovascular:  Negative for chest pain, palpitations, orthopnea, claudication and leg  swelling.  Gastrointestinal:  Negative for abdominal pain, heartburn, nausea and vomiting.  Genitourinary: Negative.   Musculoskeletal:  Negative for  joint pain and myalgias.  Skin:  Negative for rash.  Neurological:  Negative for weakness.  Endo/Heme/Allergies: Negative.   Psychiatric/Behavioral: Negative.     Objective:   Vitals:   01/16/21 1503  BP: 126/78  Pulse: 97  SpO2: 97%  Weight: (!) 347 lb 3.2 oz (157.5 kg)  Height:  (1.753 m)   Physical Exam Constitutional:      General: She is not in acute distress.    Appearance: She is obese. She is not ill-appearing.  HENT:     Head: Normocephalic and atraumatic.  Eyes:     General: No scleral icterus.    Conjunctiva/sclera: Conjunctivae normal.     Pupils: Pupils are equal, round, and reactive to light.  Cardiovascular:     Rate and Rhythm: Normal rate and regular rhythm.     Pulses: Normal pulses.     Heart sounds: Normal heart sounds. No murmur heard. Pulmonary:     Effort: Pulmonary effort is normal.     Breath sounds: Decreased breath sounds present. No wheezing, rhonchi or rales.  Abdominal:     General: Bowel sounds are normal.     Palpations: Abdomen is soft.  Musculoskeletal:     Right lower leg: No edema.     Left lower leg: No edema.  Lymphadenopathy:     Cervical: No cervical adenopathy.  Skin:    General: Skin is warm and dry.  Neurological:     General: No focal deficit present.     Mental Status: She is alert.  Psychiatric:        Mood and Affect: Mood normal.        Behavior: Behavior normal.        Thought Content: Thought content normal.        Judgment: Judgment normal.   CBC    Component Value Date/Time   WBC 15.3 (H) 07/11/2016 2036   RBC 4.65 07/11/2016 2036   HGB 13.8 07/11/2016 2036   HCT 41.6 07/11/2016 2036   PLT 345 07/11/2016 2036   MCV 89.5 07/11/2016 2036   MCH 29.7 07/11/2016 2036   MCHC 33.2 07/11/2016 2036   RDW 13.4 07/11/2016 2036   LYMPHSABS 2.8 08/02/2010 1501    MONOABS 0.7 08/02/2010 1501   EOSABS 0.4 08/02/2010 1501   BASOSABS 0.0 08/02/2010 1501   BMP Latest Ref Rng & Units 10/15/2020 03/19/2020 07/11/2016  Glucose 65 - 99 mg/dL 409(W) 119(J) 478(G)  BUN 6 - 24 mg/dL Creatinine 0.57 - 1.00 mg/dL 9.56 2.13(Y) 8.65  BUN/Creat Ratio 9 - -  Sodium 134 - 144 mmol/L 141 140 135  Potassium 3.5 - 5.2 mmol/L 4.2 4.5 3.5  Chloride 96 - 106 mmol/L 100 101 99(L)  CO2 20 - 29 mmol/L 30(H) 27 24  Calcium 8.7 - 10.2 mg/dL 9.7 9.2 9.4   Chest imaging: CXR 07/02/20 The lungs are clear without focal pneumonia, edema, pneumothorax or pleural effusion. The cardiopericardial silhouette is within normal limits for size. Thoracolumbar scoliosis.   CTA Chest 12/30/19 1. No demonstrable pulmonary embolus. No thoracic aortic aneurysm or dissection. 2. Minimal left base atelectasis. Lungs otherwise clear. 3. No evident thoracic adenopathy.   PFT: No flowsheet data found.  Labs:  Path:  Echo 03/13/2020: LV ef 60-65%. LV diastolic parameters are normal. RV systolic function is normal. RV size is normal.   Heart Catheterization:  Assessment & Plan:   Chronic hypoxemic respiratory failure (HCC) - Plan: Pulmonary Function Test  Obesity hypoventilation syndrome (HCC)  Morbid (severe) obesity due to excess calories (HCC)  Shortness of breath - Plan: CT Chest High Resolution  Discussion: Ellen Shaw is a 40 year old woman, former smoker with obesity, obstructive sleep apnea on CPAP who developed chronic respiratory failure after having covid infections in 11/2019 and 07/2019.   She does not appear to have parenchymal involvement from covid 19 infections from her CT Chest 12/2019 and chest x-ray 06/2020. She was sick with covid 19 again in 07/2020 so we will check a high resolution CT chest scan for possible post-covid 19 fibrosis. We will also repeat pulmonary function tests to compare to the PFTs she had 06/2020. She mild restrictive defect and  mild diffusion defect.   Given patient's weight, we did discuss the concern for possible obesity hypoventilation syndrome which could be leading to her new oxygen needs. We discussed the importance of weight loss.   She does not seem to have an compromised cardiac function at this time based on her recent echo.  She is to follow up in 1 month with PFTs and we will obtain the CT chest in the next couple of weeks.  Melody Comas, MD [facility] Office: [phone]    Current Outpatient Medications:    albuterol (VENTOLIN HFA) 108 (90 Base) MCG/ACT inhaler, every 4 (four) hours as needed for wheezing or shortness of breath. Every 4-6 hours as needed, Disp: , Rfl:    Cholecalciferol (VITAMIN D3) 1.25 MG (50000 UT) CAPS, Take 1 capsule by mouth once a week., Disp: , Rfl:    EPINEPHrine 0.3 mg/0.3 mL IJ SOAJ injection, Inject into the muscle as needed for anaphylaxis., Disp: , Rfl:    flecainide (TAMBOCOR) 50 MG tablet, Take 1 tablet (50 mg total) by mouth 2 (two) times daily., Disp: 180 tablet, Rfl: 2   furosemide (LASIX) 20 MG tablet, Take 1 tablet (20 mg total) by mouth once a week., Disp: 18 tablet, Rfl: 2   linaclotide (LINZESS) 290 MCG CAPS capsule, Take 290 mcg by mouth daily before breakfast., Disp: , Rfl:    loratadine (CLARITIN) 10 MG tablet, Take 10 mg by mouth daily., Disp: , Rfl:    Multiple Vitamin (MULTI-VITAMIN) tablet, Take 1 tablet by mouth daily., Disp: , Rfl:    potassium chloride (KLOR-CON) 10 MEQ tablet, Take 1 tablet (10 mEq total) by mouth once a week., Disp: 18 tablet, Rfl: 2   promethazine (PHENERGAN) 12.5 MG tablet, Take 12.5 mg by mouth every 6 (six) hours as needed for nausea or vomiting., Disp: , Rfl:    propranolol (INDERAL) 10 MG tablet, Take 1 tablet (10 mg total) by mouth at bedtime., Disp: 90 tablet, Rfl: 3   risperiDONE (RISPERDAL) 1 MG tablet, Take 1 tablet by mouth at bedtime., Disp: , Rfl:    Sertraline HCl 150 MG CAPS, Take 150 mg  by mouth daily., Disp: , Rfl:    traZODone (DESYREL) 50 MG tablet, Take by mouth at bedtime as needed for sleep., Disp: , Rfl:    Triamcinolone Acetonide (NASACORT AQ NA), Place into the nose., Disp: , Rfl:    vitamin B-12 (CYANOCOBALAMIN) 1000 MCG tablet, Take 1,000 mcg by mouth daily., Disp: , Rfl:    Umeclidinium-Vilanterol (ANORO ELLIPTA IN), Inhale into the lungs. (Patient not taking: Reported on 01/16/2021), Disp: , Rfl:

## 2021-02-05 ENCOUNTER — Ambulatory Visit
Admission: RE | Admit: 2021-02-05 | Discharge: 2021-02-05 | Disposition: A | Discharge status: Home / Self Care | Payer: Medicare Other | Source: Ambulatory Visit | Attending: Pulmonary Disease | Admitting: Pulmonary Disease

## 2021-02-05 DIAGNOSIS — R0602 Shortness of breath: Secondary | ICD-10-CM

## 2021-02-19 ENCOUNTER — Encounter: Payer: Self-pay | Admitting: Dietician

## 2021-02-19 ENCOUNTER — Other Ambulatory Visit: Payer: Self-pay

## 2021-02-19 ENCOUNTER — Encounter: Payer: Medicare Other | Attending: Cardiology | Admitting: Dietician

## 2021-02-19 DIAGNOSIS — Z6841 Body Mass Index (BMI) 40.0 and over, adult: Secondary | ICD-10-CM | POA: Insufficient documentation

## 2021-02-19 DIAGNOSIS — J45909 Unspecified asthma, uncomplicated: Secondary | ICD-10-CM | POA: Diagnosis not present

## 2021-02-19 DIAGNOSIS — I10 Essential (primary) hypertension: Secondary | ICD-10-CM | POA: Diagnosis not present

## 2021-02-19 DIAGNOSIS — R7303 Prediabetes: Secondary | ICD-10-CM | POA: Insufficient documentation

## 2021-02-19 DIAGNOSIS — E785 Hyperlipidemia, unspecified: Secondary | ICD-10-CM | POA: Insufficient documentation

## 2021-02-19 DIAGNOSIS — K219 Gastro-esophageal reflux disease without esophagitis: Secondary | ICD-10-CM | POA: Insufficient documentation

## 2021-02-19 DIAGNOSIS — Z713 Dietary counseling and surveillance: Secondary | ICD-10-CM | POA: Diagnosis not present

## 2021-02-19 NOTE — Progress Notes (Signed)
Referral placed.

## 2021-02-19 NOTE — Progress Notes (Signed)
Medical Nutrition Therapy  Appointment Start time:  1100  Appointment End time:  1130  Primary concerns today: Weight loss, glycemic control.  Referral diagnosis: E66.01 - Morbid Obesity Preferred learning style: No preference indicated Learning readiness: Ready   NUTRITION ASSESSMENT   Anthropometrics  Ht: 5'9" Wt: 347.1 lbs Wt Change: -1 lbs Body mass index is 51.26 kg/m.    Clinical Medical Hx: HTN, HLD, GERD, Prediabetes, Asthma Medications: See list Labs: A1c - 6.6, LDL - 116 Notable Signs/Symptoms: N/A   Lifestyle & Dietary Hx Pt has been restarted on Lasix and KlorCon once a week, blood pressure was dropping too low when taking them 3x a week. Pt is still dealing with some fluid retention. Pt is working on weaning off of supplemental oxygen. Pt keeps their tank with them, only has to use it a few times a day instead of constantly. Pt still needs to use oxygen after taking their dogs for a walk. Pt reports joining [employer] right before Thanksgiving, has been 3 or 4 times in the past couple of weeks. Pt rides stationary bike, treadmill, and used massage chair. Pt can exercise for about 20-30 minutes at a time. Pt reports diet has been up and down. Pt has been eating fruits and vegetables for snacks. Pt is concerned about the type of foods that will be around during their birthday and Christmas, states they are afraid they will eat too many sweets.  Food allergies: Oranges (Breaks out in a rash), pickles and tomatoes (severe GI pain due to seeds)  Estimated daily fluid intake: 50 oz Supplements: Daily MV, Vitamin D (50,000 IU), B-12 Sleep: OSA, uses CPAP Stress / self-care: Usually 6/10, family troubles caring for mother Current average weekly physical activity: ADL   24-Hr Dietary Recall First Meal: Black coffee, 2 packs of PB crackers,  Snack: none Second Meal: Glucerna Snack: none Third Meal: Lettuce and cheese salad Snack: none Beverages: coffee,  water   NUTRITION DIAGNOSIS  NB-1.1 Food and nutrition-related knowledge deficit As related to obesity.  As evidenced by BMI of 48.82 kg/m2, limited physical activity, and dinner meals high in starches..   NUTRITION INTERVENTION  Nutrition education (E-1) on the following topics:  Educated patient on the balanced plate eating model. Recommended lunch and dinner be 1/2 non-starchy vegetables, 1/4 starches, and 1/4 protein. Recommended breakfast be a balance of starch and protein with a piece of fruit. Discussed with patient the importance of working towards hitting the proportions of the balanced plate consistently.  Educated patient on factors that contribute to elevation of blood sugars, such as stress, illness, injury,and food choices. Discussed the role that physical activity plays in lowering blood sugar. Educate patient on the three main macronutrients. Protein, fats, and carbohydrates. Discussed how each of these macronutrients affect blood sugar levels, especially carbohydrate, and the importance of eating a consistent amount of carbohydrate throughout the day.    Handouts Provided Include  Balanced Plate Balanced Plate Food List  Learning Style & Readiness for Change Teaching method utilized: Visual & Auditory  Demonstrated degree of understanding via: Teach Back  Barriers to learning/adherence to lifestyle change: Limited capacity for physical activity  Goals Established by Pt Keep up the great work at the gym!! Remember that every time you exercise for 20 minutes you are lowering your blood sugar, burning fat, and strengthening your heart!! On days when you exercise, have an extra bottle of water. Have some before and after you exercise. Choose reduced-fat cheddar cheese for your  salads. When having canned beans, drain and rinse them to reduce the sodium. A serving size of grapes is about a palm full, and a serving of an apple is about the size of a baseball. Aim for about 3  servings of fruit each day. When having holiday foods, try your best to moderate your portions! Any way that you can have less is a good thing!   MONITORING & EVALUATION Dietary intake, weekly physical activity, and weight in 4 months.  Next Steps  Patient is to follow with RD.

## 2021-02-19 NOTE — Patient Instructions (Addendum)
Keep up the great work at [employer]!! Remember that every time you exercise for 20 minutes you are lowering your blood sugar, burning fat, and strengthening your heart!!  On days when you exercise, have an extra bottle of water. Have some before and after you exercise.  Choose reduced-fat cheddar cheese for your salads.  When having canned beans, drain and rinse them to reduce the sodium.  A serving size of grapes is about a palm full, and a serving of an apple is about the size of a baseball. Aim for about 3 servings of fruit each day.  When having holiday foods, try your best to moderate your portions! Any way that you can have less is a good thing!

## 2021-02-26 LAB — PROTIME-INR: INR: 9.8 — AB (ref 0.9–1.1)

## 2021-02-27 ENCOUNTER — Telehealth: Payer: Self-pay | Admitting: Cardiology

## 2021-02-27 NOTE — Telephone Encounter (Signed)
Patient c/o Palpitations:  High priority if patient c/o lightheadedness, shortness of breath, or chest pain  How long have you had palpitations/irregular HR/ Afib? Are you having the symptoms now? SINCE YESTERDAY  Are you currently experiencing lightheadedness, SOB or CP? 93% OXYGEN LEVEL BUT SHE FEELS LIKE SHE CANT CATCH HER BREATH Do you have a history of afib (atrial fibrillation) or irregular heart rhythm? YES  Have you checked your BP or HR? (document readings if available): HR 80 148/99  Are you experiencing any other symptoms? NO OTHER SYMPTOMS. PT WANTS TO KNOW IF IT'S OKAY FOR HER TO TAKE AN EXTRA PROPRANOLOL 10MG 

## 2021-02-27 NOTE — Telephone Encounter (Signed)
Called patient. There was no answer or voicemail set up.

## 2021-02-28 ENCOUNTER — Other Ambulatory Visit: Payer: Self-pay

## 2021-02-28 ENCOUNTER — Ambulatory Visit (INDEPENDENT_AMBULATORY_CARE_PROVIDER_SITE_OTHER): Payer: Medicare Other | Admitting: Cardiology

## 2021-02-28 ENCOUNTER — Encounter: Payer: Self-pay | Admitting: Cardiology

## 2021-02-28 VITALS — BP 110/62 | HR 68 | Resp 20 | Ht 69.0 in | Wt 351.0 lb

## 2021-02-28 DIAGNOSIS — J9611 Chronic respiratory failure with hypoxia: Secondary | ICD-10-CM

## 2021-02-28 DIAGNOSIS — R079 Chest pain, unspecified: Secondary | ICD-10-CM

## 2021-02-28 DIAGNOSIS — Z79899 Other long term (current) drug therapy: Secondary | ICD-10-CM | POA: Insufficient documentation

## 2021-02-28 DIAGNOSIS — R0789 Other chest pain: Secondary | ICD-10-CM | POA: Insufficient documentation

## 2021-02-28 DIAGNOSIS — R072 Precordial pain: Secondary | ICD-10-CM | POA: Insufficient documentation

## 2021-02-28 DIAGNOSIS — R0781 Pleurodynia: Secondary | ICD-10-CM | POA: Diagnosis not present

## 2021-02-28 MED ORDER — FUROSEMIDE 20 MG PO TABS: 20.0000 mg | ORAL_TABLET | ORAL | 3 refills | Status: DC

## 2021-02-28 NOTE — Patient Instructions (Signed)
Medication Instructions:  Your physician has recommended you make the following change in your medication:  INCREASE: Lasix 20 mg twice weekly (Tuesday and Thursday) *If you need a refill on your cardiac medications before your next appointment, please call your pharmacy*   Lab Work: Your physician recommends that you return for lab work in:  ESR, CRP, Flecainide, BMET, Mag If you have labs (blood work) drawn today and your tests are completely normal, you will receive your results only by: [place] (if you have [place]) OR A paper copy in the mail If you have any lab test that is abnormal or we need to change your treatment, we will call you to review the results.   Testing/Procedures: Non-Cardiac CT Angiography (CTA), is a special type of CT scan that uses a computer to produce multi-dimensional views of major blood vessels throughout the body. In CT angiography, a contrast material is injected through an IV to help visualize the blood vessels    Follow-Up: At [facility], you and your health needs are our priority.  As part of our continuing mission to provide you with exceptional heart care, we have created designated Provider Care Teams.  These Care Teams include your primary Cardiologist (physician) and Advanced Practice Providers (APPs -  Physician Assistants and Nurse Practitioners) who all work together to provide you with the care you need, when you need it.  We recommend signing up for the patient portal called "MyChart".  Sign up information is provided on this After Visit Summary.  MyChart is used to connect with patients for Virtual Visits (Telemedicine).  Patients are able to view lab/test results, encounter notes, upcoming appointments, etc.  Non-urgent messages can be sent to your provider as well.   To learn more about what you can do with MyChart, go to [email or web address].    Your next appointment:   8 week(s)  The format for your next appointment:   In  Person  Provider:   Berniece Salines, DO     Other Instructions

## 2021-02-28 NOTE — Progress Notes (Signed)
Cardiology Office Note:    Date:  02/28/2021   ID:  Ellen Shaw, DOB 1980/04/28, MRN [id number]  PCP:  Renaldo Reel, PA  Cardiologist:  Berniece Salines, DO  Electrophysiologist:  None   Referring MD: Renaldo Reel, PA   " I was recently in the hospital"  History of Present Illness:    Ellen Shaw is a 40 y.o. female with a hx of chronic respiratory failure now on oxygen managed by her primary care doctor, hypertension, morbid obesity, nonsustained ventricular tachycardia on the monitor, recurrent palpitations here today for follow-up visit.   I saw the patient 1 March 20, 2019 at that time she had some chest pain and based on her I did have some multiple episodes of nonsustained ventricular tachycardia I recommended she undergo a coronary CTA to rule out coronary artery disease.   I saw the patient on April 12, 2019 we discussed her CT results which showed 0 calcium with no evidence of coronary artery disease.  She did tell me that her palpitations were not being helped by the Cardizem.  We stopped the Cardizem and start the patient on propanolol 20 mg at nighttime and flecainide 50 mg twice a day.     I saw the patient on October 15, 2020 at that time she had she started oxygen.  She was doing well with this.  During that time we restarted her propanolol 10 mg daily due to low blood pressure and we kept her on the flecainide twice a day.  I saw the patient on January 07, 2021 at that time she was doing well she has some leg swelling and her palpitations have improved significantly.  In the interim the patient was doing well.  But has been following with the cardio Pearlie Oyster who has been treating her for possible bulging disks.  The chiropractor recommended that she go to the emergency department initially on February 22, 2021 at which time she was experiencing left upper back pain spasm as well as left arm weakness.  At that time she described her muscle spasms that he was on and  off which started back in November.  During that visit all of her work-up was negative she had a lumbar spine x-ray which showed evidence of moderate this changes at L5-S1 as well as L4-L5.  There was also evidence of chronic lumbar hyperlordosis.  At that time she also had a shoulder x-ray which showed all of her joint spaces were maintained.  On December 13 the patient was seen at [facility] for similar concerns all of her testing was negative and she was discharged home.  She tells me yesterday she went to see her PCP and there was concern for chest discomfort she therefore EMS was called and the patient was transported to [facility].  He noted on route to the hospital EMS mention to her that she had some bigeminy and there was concern for V. tach.  However the notes from the ED visit does not mention any note of NSVT or bigeminy.  She was triaged for chest pain troponins were negative x2, CMP was normal CBC was normal, chest x-ray showed mild increased pulmonary vascular congestion.  And she was discharged home asked to see cardiology.  She tells me that the pain sometimes feels pleuritic when she is taking deep breaths sometimes she feels there radiation to her arms and she feels some shooting arm pain with numbness.  Is also some spasm.  She denies any palpitations states that this has improved significantly.    Past Medical History:  Diagnosis Date   Alcohol abuse 02/21/2014   Allergic rhinitis 01/31/2008   Qualifier: Diagnosis of  By: Lenn Cal (Deborra Medina), [place] of this note might be different from the original. Overview:  Qualifier: Diagnosis of  By: Lenn Cal Deborra Medina), [place] 07/22/2016   Arthritis 07/22/2016   Formatting of this note might be different from the original. Knees Formatting of this note might be different from the original. Overview:  Knees   Asthma 01/31/2008   Qualifier: Diagnosis of  By: Lenn Cal Deborra Medina), [place] of this  note might be different from the original. Overview:  Qualifier: Diagnosis of  By: Lenn Cal Deborra Medina), [place] Canes   ASTHMA 01/31/2008   Qualifier: Diagnosis of  By: Lenn Cal Deborra Medina), [place]     Bell's palsy    age 65-14   Bilateral leg edema 02/15/2020   Body mass index (BMI) of 50.0 to 59.9 in adult [facility]) 01/06/2017   Chest discomfort 03/19/2020   Complex atypical endometrial hyperplasia 08/01/2013   Constipation 06/28/2018   DYSPNEA 01/31/2008   Qualifier: Diagnosis of  By: Elsworth Soho MD, Leanna Sato.     Episodic mood disorder ([place]) 02/04/2012   Essential hypertension 91/79/1505   Folliculitis 6/97/9480   GERD (gastroesophageal reflux disease) 1/65/5374   History of Helicobacter pylori infection 03/03/2017   Hx of migraines 02/13/2020   Hyperlipidemia    Hypertension    Intertrigo 09/01/2017   Lumbosacral radiculopathy 09/01/2013   Major depressive disorder, recurrent episode ([place]) 05/02/2014   Medication overuse headache 07/27/2014   Migraine with aura and without status migrainosus, not intractable 07/27/2014   Formatting of this note might be different from the original. Follows with Dr. Cristela Blue in Neurology. HX pseudotumor cerebri   Mixed stress and urge urinary incontinence 08/12/2013   Morbid obesity ([place]) 02/15/2020   Multiple allergies 02/13/2020   NSVT (nonsustained ventricular tachycardia) 03/19/2020   Obstructive sleep apnea on CPAP 12/27/2010   Formatting of this note might be different from the original. Uses Cpap  Formatting of this note might be different from the original. AHI=21.8 AutoPAP at 15-20 with an EPR=3  Last Assessment & Plan:  Formatting of this note might be different from the original. 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 Relevant Hx: Course: Daily Update: Today's Plan:  Electronically signed by: Blenda Bridegroom, MD Resident 12/22/14 [phone]   Prediabetes 06/30/2017   Formatting of this note might be different from the original. A1C 6.1 on 06/2017   Primary hypertension 02/15/2020   Primary osteoarthritis of right knee 10/12/2012   Pseudotumor    PVC (premature ventricular contraction) 03/19/2020   Right bundle branch block 02/15/2020   Right cervical radiculopathy 07/18/2014   Sleep apnea    SVT (supraventricular tachycardia) ([place]) 07/15/2016   Tobacco use 7/86/7544   Umbilical cyst 12/04/1005   Urinary retention 08/19/2013   Formatting of this note might be different from the original. S/P interstim placement with Urology   Vulvovaginal candidiasis 03/03/2017    Past Surgical History:  Procedure Laterality Date   BLADDER SURGERY  TONSILLECTOMY AND ADENOIDECTOMY     VAGINAL HYSTERECTOMY  2015    Current Medications: Current Meds  Medication Sig   Cholecalciferol (VITAMIN D3) 1.25 MG (50000 UT) CAPS Take 1 capsule by mouth once a week.   EPINEPHrine 0.3 mg/0.3 mL IJ SOAJ injection Inject into the muscle as needed for anaphylaxis.   flecainide (TAMBOCOR) 50 MG tablet Take 1 tablet (50 mg total) by mouth 2 (two) times daily.   furosemide (LASIX) 20 MG tablet Take 1 tablet (20 mg total) by mouth 2 (two) times a week. (Tuesday and Saturday)   linaclotide (LINZESS) 290 MCG CAPS capsule Take 290 mcg by mouth daily before breakfast.   loratadine (CLARITIN) 10 MG tablet Take 10 mg by mouth daily.   Multiple Vitamin (MULTI-VITAMIN) tablet Take 1 tablet by mouth daily.   potassium chloride (KLOR-CON) 10 MEQ tablet Take 1 tablet (10 mEq total) by mouth once a week.   promethazine (PHENERGAN) 12.5 MG tablet Take 12.5 mg by mouth every 6 (six) hours as needed for nausea or vomiting.   propranolol (INDERAL) 10 MG tablet Take 1 tablet (10 mg  total) by mouth at bedtime.   risperiDONE (RISPERDAL) 1 MG tablet Take 1 tablet by mouth at bedtime.   Sertraline HCl 150 MG CAPS Take 150 mg by mouth daily.   traZODone (DESYREL) 50 MG tablet Take by mouth at bedtime as needed for sleep.   Triamcinolone Acetonide (NASACORT AQ NA) Place into the nose.   vitamin B-12 (CYANOCOBALAMIN) 1000 MCG tablet Take 1,000 mcg by mouth daily.   [DISCONTINUED] furosemide (LASIX) 20 MG tablet Take 1 tablet (20 mg total) by mouth once a week.     Allergies:   Ciprofloxacin, Clindamycin, Esomeprazole magnesium, Nitrofurantoin, Oxycodone, Sulfa antibiotics, Sulfamethoxazole-trimethoprim, Trimethoprim, Bee pollen, Citrus, Doxycycline, Fluticasone, Penicillins, Pollen extract, Bee venom, Buspirone, Esomeprazole magnesium, Fentanyl, Flonase [fluticasone propionate], Fluticasone propionate, Lexapro [escitalopram], Paliperidone, Pantoprazole, and Orange oil   Social History   Socioeconomic History   Marital status: Single    Spouse name: Not on file   Number of children: Not on file   Years of education: Not on file   Highest education level: Not on file  Occupational History   Not on file  Tobacco Use   Smoking status: Former    Types: Cigarettes    Quit date: 01/19/2020    Years since quitting: 1.1   Smokeless tobacco: Never  Substance and Sexual Activity   Alcohol use: Not Currently   Drug use: No   Sexual activity: Not on file  Other Topics Concern   Not on file  Social History Narrative   Not on file   Social Determinants of Health   Financial Resource Strain: Not on file  Food Insecurity: Not on file  Transportation Needs: Not on file  Physical Activity: Not on file  Stress: Not on file  Social Connections: Not on file     Family History: The patient's family history includes Atrial fibrillation in her father; COPD in her father; Congestive Heart Failure in her father; Glaucoma in her father; Heart attack in her maternal grandmother; High  Cholesterol in her mother; Hypertension in her father; Kidney disease in her father; Lung cancer in her father and paternal grandmother; Macular degeneration in her father; Stroke in her father.  ROS:   Review of Systems  Constitution: Negative for decreased appetite, fever and weight gain.  HENT: Negative for congestion, ear discharge, hoarse voice and sore throat.   Eyes: Negative for discharge, redness,  vision loss in right eye and visual halos.  Cardiovascular: Negative for chest pain, dyspnea on exertion, leg swelling, orthopnea and palpitations.  Respiratory: Negative for cough, hemoptysis, shortness of breath and snoring.   Endocrine: Negative for heat intolerance and polyphagia.  Hematologic/Lymphatic: Negative for bleeding problem. Does not bruise/bleed easily.  Skin: Negative for flushing, nail changes, rash and suspicious lesions.  Musculoskeletal: Negative for arthritis, joint pain, muscle cramps, myalgias, neck pain and stiffness.  Gastrointestinal: Negative for abdominal pain, bowel incontinence, diarrhea and excessive appetite.  Genitourinary: Negative for decreased libido, genital sores and incomplete emptying.  Neurological: Negative for brief paralysis, focal weakness, headaches and loss of balance.  Psychiatric/Behavioral: Negative for altered mental status, depression and suicidal ideas.  Allergic/Immunologic: Negative for HIV exposure and persistent infections.    EKGs/Labs/Other Studies Reviewed:    The following studies were reviewed today:   EKG:  The ekg ordered today demonstrates sinus rhythm 68bpm   CCTA  Aorta: Normal size.  No calcifications.  No dissection.   Aortic Valve:  Trileaflet.  No calcifications.   Coronary Arteries:  Normal coronary origin.  Right dominance.   RCA is a large dominant artery that gives rise to PDA and PLVB. There is no plaque.   Left main is a large artery that gives rise to LAD and LCX arteries.   LAD is a large vessel  that has no plaque.   LCX is a non-dominant artery that gives rise to one large OM1 branch. There is no plaque.   Other findings:   Normal pulmonary vein drainage into the left atrium.   Normal left atrial appendage without a thrombus.   Normal size of the pulmonary artery.   IMPRESSION: 1. Coronary calcium score of 0. This was 0 percentile for age and sex matched control.   2. Normal coronary origin with right dominance.   3. No evidence of CAD.     Zio monitor The patient wore the monitor for 14 days starting February 15, 2020   . Indication: Palpitations   The minimum heart rate was 47 bpm, maximum heart rate was 240 bpm, and average heart rate was 85 bpm. Predominant underlying rhythm was Sinus Rhythm.  First-degree AV block was present.   9 Ventricular Tachycardia runs occurred, the run with the fastest interval lasting 5 beats with a maximum rate of 240 bpm, the longest lasting 5 beats with an average rate of 184 bpm.   Premature atrial complexes were rare less than 1%. Premature Ventricular complexes rare less than 1%.   No pauses, No AV block and no atrial fibrillation present. 14 patient triggered events and 13 diary events associated with premature ventricular complex.   Conclusion: This study is remarkable for the following:                             1.  Nonsustained ventricular tachycardia                             2.  Rare symptomatic premature ventricular complex.   Transthoracic Echocardiogram IMPRESSIONS  1. Left ventricular ejection fraction, by estimation, is 60 to 65%. The left ventricle has normal function. The left ventricle has no regional wall motion abnormalities. Left ventricular diastolic parameters were normal.   2. Right ventricular systolic function is normal. The right ventricular size is normal.   3. The mitral valve is normal in structure.  No evidence of mitral valve regurgitation. No evidence of mitral stenosis.   4. The aortic valve is  normal in structure. Aortic valve regurgitation is not visualized. No aortic stenosis is present.   5. The inferior vena cava is normal in size with greater than 50% respiratory variability, suggesting right atrial pressure of 3 mmHg.   FINDINGS   Left Ventricle: Left ventricular ejection fraction, by estimation, is 60 to 65%. The left ventricle has normal function. The left ventricle has no regional wall motion abnormalities. Definity contrast agent was given IV to delineate the left ventricular   endocardial borders. The left ventricular internal cavity size was normal in size. There is no left ventricular hypertrophy. Left ventricular diastolic parameters were normal.   Right Ventricle: The right ventricular size is normal. No increase in right ventricular wall thickness. Right ventricular systolic function is  normal.   Left Atrium: Left atrial size was normal in size.   Right Atrium: Right atrial size was normal in size.   Pericardium: There is no evidence of pericardial effusion.   Mitral Valve: The mitral valve is normal in structure. No evidence of  mitral valve regurgitation. No evidence of mitral valve stenosis.   Tricuspid Valve: The tricuspid valve is normal in structure. Tricuspid  valve regurgitation is trivial. No evidence of tricuspid stenosis.   Aortic Valve: The aortic valve is normal in structure. Aortic valve  regurgitation is not visualized. No aortic stenosis is present.   Pulmonic Valve: The pulmonic valve was normal in structure. Pulmonic valve  regurgitation is not visualized. No evidence of pulmonic stenosis.   Aorta: The aortic root is normal in size and structure.   Venous: The inferior vena cava is normal in size with greater than 50%  respiratory variability, suggesting right atrial pressure of 3 mmHg.   IAS/Shunts: No atrial level shunt detected by color flow Doppler.     ZIO monitor 2022 Patch Wear Time:  13 days and 14 hours starting November 12, 2020. Indication palpitation   Patient had a min HR of 51 bpm, max HR of 158 bpm, and avg HR of 78 bpm. Predominant underlying rhythm was Sinus Rhythm. First Degree AV Block was present.    8 Supraventricular Tachycardia runs occurred, the run with the fastest interval lasting 5 beats with a max rate of 158 bpm (avg 128 bpm); the run with the fastest interval was  also the longest. Isolated SVEs were rare (<1.0%), SVE Couplets were rare (<1.0%), and SVE Triplets were rare (<1.0%). Isolated VEs were rare (<1.0%), VE Couplets were rare (<1.0%), and no VE Triplets were present. Ventricular Bigeminy and Trigeminy  were present.   Symptoms were associated with sinus rhythm and PVC.   No atrial fibrillation.   Conclusion: This study is remarkable for paroxysmal supraventricular tachycardia.  Recent Labs: 10/15/2020: BUN 7; Creatinine, Ser 0.64; Magnesium 2.2; Potassium 4.2; Sodium 141  Recent Lipid Panel No results found for: CHOL, TRIG, HDL, CHOLHDL, VLDL, LDLCALC, LDLDIRECT  Physical Exam:    VS:  BP 110/62 (BP Location: Left Arm, Patient Position: Sitting, Cuff Size: Normal)    Pulse 68    Resp 20    Ht _0  (1.753 m)    Wt (!) 351 lb (159.2 kg)    SpO2 97%    BMI 51.83 kg/m     Wt Readings from Last 3 Encounters:  02/28/21 (!) 351 lb (159.2 kg)  02/19/21 (!) 347 lb 1.6 oz (157.4 kg)  01/16/21 (!) 347  lb 3.2 oz (157.5 kg)     GEN: Well nourished, well developed in no acute distress HEENT: Normal NECK: No JVD; No carotid bruits LYMPHATICS: No lymphadenopathy CARDIAC: S1S2 noted,RRR, no murmurs, rubs, gallops RESPIRATORY:  Clear to auscultation without rales, wheezing or rhonchi  ABDOMEN: Soft, non-tender, non-distended, +bowel sounds, no guarding. EXTREMITIES: No edema, No cyanosis, no clubbing MUSCULOSKELETAL:  No deformity  SKIN: Warm and dry NEUROLOGIC:  Alert and oriented x 3, non-focal PSYCHIATRIC:  Normal affect, good insight  ASSESSMENT:    1. Pleuritic chest pain     2. Medication management   3. Pleuritic chest pain   4. Precordial pain   5. Chest pain, unspecified type   6. Chronic respiratory failure with hypoxia (HCC)   7. Morbid obesity ([place])    PLAN:    The patient pain that she describes sounds pleuritic in I will like to get a CT of the chest to rule out urgently any evidence of pulmonary embolism.  We will also get a CRP and ESR if there is any inflammation I will opt to treat the patient for pericarditis.  Doubt ACS, recent coronary CTA which was done in January 2022 was normal with no evidence of coronary artery disease.  Some other characteristics at times of this pain makes me wonder if part of this is neuropathy.  She may need to have her cervical and thoracic spine imaged to make sure there is no bulging disks or pinched nerve that may be causing neuropathic pain in this patient.  Her EKG today is normal, she is on flecainide 50 mg twice daily for paroxysmal atrial fibrillation.  Flecainide level will be obtained today.  She is on oxygen for chronic respiratory failure.  Her recent chest x-ray showed pulmonary congestion so I have asked the patient to increase her Lasix to twice a week cautiously given the fact that her blood pressure is very marginal.  The patient understands the need to lose weight with diet and exercise. We have discussed specific strategies for this.  The patient is in agreement with the above plan. The patient left the office in stable condition.  The patient will follow up in 8 weeks.   Medication Adjustments/Labs and Tests Ordered: Current medicines are reviewed at length with the patient today.  Concerns regarding medicines are outlined above.  Orders Placed This Encounter  Procedures   CT ANGIO CHEST AORTA W &/OR WO CONTRAST   Basic Metabolic Panel (BMET)   Magnesium   Sed Rate (ESR)   C-reactive protein   Flecainide level   EKG 12-Lead   Meds ordered this encounter  Medications   furosemide (LASIX)  20 MG tablet    Sig: Take 1 tablet (20 mg total) by mouth 2 (two) times a week. (Tuesday and Saturday)    Dispense:  26 tablet    Refill:  3    Patient Instructions  Medication Instructions:  Your physician has recommended you make the following change in your medication:  INCREASE: Lasix 20 mg twice weekly (Tuesday and Thursday) *If you need a refill on your cardiac medications before your next appointment, please call your pharmacy*   Lab Work: Your physician recommends that you return for lab work in:  ESR, CRP, Flecainide, BMET, Mag If you have labs (blood work) drawn today and your tests are completely normal, you will receive your results only by: [place] (if you have MyChart) OR A paper copy in the mail If you have any lab  test that is abnormal or we need to change your treatment, we will call you to review the results.   Testing/Procedures: Non-Cardiac CT Angiography (CTA), is a special type of CT scan that uses a computer to produce multi-dimensional views of major blood vessels throughout the body. In CT angiography, a contrast material is injected through an IV to help visualize the blood vessels    Follow-Up: At [facility], you and your health needs are our priority.  As part of our continuing mission to provide you with exceptional heart care, we have created designated Provider Care Teams.  These Care Teams include your primary Cardiologist (physician) and Advanced Practice Providers (APPs -  Physician Assistants and Nurse Practitioners) who all work together to provide you with the care you need, when you need it.  We recommend signing up for the patient portal called "MyChart".  Sign up information is provided on this After Visit Summary.  MyChart is used to connect with patients for Virtual Visits (Telemedicine).  Patients are able to view lab/test results, encounter notes, upcoming appointments, etc.  Non-urgent messages can be sent to your provider as  well.   To learn more about what you can do with MyChart, go to [email or web address].    Your next appointment:   8 week(s)  The format for your next appointment:   In Person  Provider:   Berniece Salines, DO     Other Instructions     Adopting a Healthy Lifestyle.  Know what a healthy weight is for you (roughly BMI <25) and aim to maintain this   Aim for 7+ servings of fruits and vegetables daily   65-80+ fluid ounces of water or unsweet tea for healthy kidneys   Limit to max 1 drink of alcohol per day; avoid smoking/tobacco   Limit animal fats in diet for cholesterol and heart health - choose grass fed whenever available   Avoid highly processed foods, and foods high in saturated/trans fats   Aim for low stress - take time to unwind and care for your mental health   Aim for 150 min of moderate intensity exercise weekly for heart health, and weights twice weekly for bone health   Aim for 7-9 hours of sleep daily   When it comes to diets, agreement about the perfect plan isnt easy to find, even among the experts. Experts at the [employer] developed an idea known as the Healthy Eating Plate. Just imagine a plate divided into logical, healthy portions.   The emphasis is on diet quality:   Load up on vegetables and fruits - one-half of your plate: Aim for color and variety, and remember that potatoes dont count.   Go for whole grains - one-quarter of your plate: Whole wheat, barley, wheat berries, quinoa, oats, brown rice, and foods made with them. If you want pasta, go with whole wheat pasta.   Protein power - one-quarter of your plate: Fish, chicken, beans, and nuts are all healthy, versatile protein sources. Limit red meat.   The diet, however, does go beyond the plate, offering a few other suggestions.   Use healthy plant oils, such as olive, canola, soy, corn, sunflower and peanut. Check the labels, and avoid partially hydrogenated oil, which  have unhealthy trans fats.   If youre thirsty, drink water. Coffee and tea are good in moderation, but skip sugary drinks and limit milk and dairy products to one or two daily servings.   The type of carbohydrate  in the diet is more important than the amount. Some sources of carbohydrates, such as vegetables, fruits, whole grains, and beans-are healthier than others.   Finally, stay active  Signed, Berniece Salines, DO  02/28/2021 3:09 PM    [facility]

## 2021-03-01 ENCOUNTER — Other Ambulatory Visit: Payer: Self-pay

## 2021-03-01 ENCOUNTER — Ambulatory Visit (HOSPITAL_COMMUNITY)
Admission: RE | Admit: 2021-03-01 | Discharge: 2021-03-01 | Disposition: A | Discharge status: Home / Self Care | Payer: Medicare Other | Source: Ambulatory Visit | Attending: Cardiology | Admitting: Cardiology

## 2021-03-01 ENCOUNTER — Encounter: Payer: Self-pay | Admitting: Cardiology

## 2021-03-01 ENCOUNTER — Other Ambulatory Visit: Payer: Self-pay | Admitting: Internal Medicine

## 2021-03-01 DIAGNOSIS — R079 Chest pain, unspecified: Secondary | ICD-10-CM | POA: Insufficient documentation

## 2021-03-01 DIAGNOSIS — R0789 Other chest pain: Secondary | ICD-10-CM | POA: Insufficient documentation

## 2021-03-01 LAB — SARS CORONAVIRUS 2 (TAT 6-24 HRS): SARS Coronavirus 2: NEGATIVE

## 2021-03-01 MED ORDER — IOHEXOL 350 MG/ML SOLN
80.0000 mL | Freq: Once | INTRAVENOUS | Status: AC | PRN
  Administered 2021-03-01: 80 mL via INTRAVENOUS

## 2021-03-01 MED ORDER — SODIUM CHLORIDE (PF) 0.9 % IJ SOLN
INTRAMUSCULAR | Status: AC
  Filled 2021-03-01: qty 50

## 2021-03-01 NOTE — Progress Notes (Signed)
Corrected order for [place].

## 2021-03-04 ENCOUNTER — Ambulatory Visit (INDEPENDENT_AMBULATORY_CARE_PROVIDER_SITE_OTHER): Payer: Medicare Other | Admitting: Pulmonary Disease

## 2021-03-04 ENCOUNTER — Encounter: Payer: Self-pay | Admitting: Pulmonary Disease

## 2021-03-04 ENCOUNTER — Other Ambulatory Visit: Payer: Self-pay

## 2021-03-04 VITALS — BP 120/84 | HR 74 | Ht 69.0 in | Wt 346.0 lb

## 2021-03-04 DIAGNOSIS — J9611 Chronic respiratory failure with hypoxia: Secondary | ICD-10-CM

## 2021-03-04 DIAGNOSIS — E662 Morbid (severe) obesity with alveolar hypoventilation: Secondary | ICD-10-CM | POA: Diagnosis not present

## 2021-03-04 LAB — PULMONARY FUNCTION TEST
DL/VA % pred: 103 %
DL/VA: 4.43 ml/min/mmHg/L
DLCO cor % pred: 78 %
DLCO cor: 20.19 ml/min/mmHg
DLCO unc % pred: 80 %
DLCO unc: 20.62 ml/min/mmHg
FEF 25-75 Post: 3.43 L/sec
FEF 25-75 Pre: 2.87 L/sec
FEF2575-%Change-Post: 19 %
FEF2575-%Pred-Post: 100 %
FEF2575-%Pred-Pre: 84 %
FEV1-%Change-Post: 5 %
FEV1-%Pred-Post: 77 %
FEV1-%Pred-Pre: 73 %
FEV1-Post: 2.69 L
FEV1-Pre: 2.56 L
FEV1FVC-%Change-Post: 0 %
FEV1FVC-%Pred-Pre: 101 %
FEV6-%Change-Post: 4 %
FEV6-%Pred-Post: 76 %
FEV6-%Pred-Pre: 72 %
FEV6-Post: 3.22 L
FEV6-Pre: 3.08 L
FEV6FVC-%Pred-Post: 102 %
FEV6FVC-%Pred-Pre: 102 %
FVC-%Change-Post: 4 %
FVC-%Pred-Post: 74 %
FVC-%Pred-Pre: 71 %
FVC-Post: 3.22 L
FVC-Pre: 3.08 L
Post FEV1/FVC ratio: 84 %
Post FEV6/FVC ratio: 100 %
Pre FEV1/FVC ratio: 83 %
Pre FEV6/FVC Ratio: 100 %
RV % pred: 82 %
RV: 1.54 L
TLC % pred: 78 %
TLC: 4.54 L

## 2021-03-04 NOTE — Progress Notes (Signed)
Synopsis: Referred in November 2022 for chronic hypoxemic respiratory failure by Ellen Gunning, PA  Subjective:   PATIENT ID: Ellen Shaw, Ellen Shaw, MRN: [id number]  HPI  Chief Complaint  Patient presents with   Follow-up    1 mo f/u after PFT. States she has been doing ok since last visit.    Ellen Shaw is a 40 year old woman, former smoker with obesity, chronic hypoxemic respiratory failure and obstructive sleep apnea on cpap who returns to pulmonary clinic for follow up.   HRCT Chest from 02/05/21 did not show any parenchymal or airway issues. PFTs today show mild restrictive defect with normal diffusion capacity. There was slight improvement in FEV1, FVC, TLC and DLCO overall compared to prior PFTs from [facility].   She continues to have exertional dyspnea. Denies cough, wheezing or chest tightness. She reports episodes of her heart rate and a decline in SpO2. She does continue to have palpations despite being on flecainide and propranolol per cardiology.   She is being evaluated by ENT in [facility] who is planning to operate on bilateral bone spurs of her nose.   She has not returned to smoking. She started going to the weight management clinic last month.  OV 01/16/21 She was started on oxygen earlier this year by her primary care team due to SpO2 readings in the mid 80s on room air. She is currently on 1-2L of supplemental oxygen. She is using CPAP therapy at night and is followed at [facility] for her sleep apnea.   The oxygen need is new since being sick with covid Shaw in 11/2019 and again 07/2020.   She had pulmonary function tests performed 07/05/20 which showed FEV1 2.58L (74%), FVC 3.12L (72%), ratio 83, TLC 4.14L (71%), DLCO 30.99 (74%).  She was evaluated by Dr. Eual Shaw of pulmonary at [facility] on 07/12/20. She was referred to pulmonary rehab at [facility] and is waiting to be enrolled in this program. They trialed her on anoro  ellipta but she did not tolerate this medicine due to chest discomfort. They recommended weight loss.  She is seeing a nutritionist at this time to work on weight loss.   She quit smoking 04/2020 and smoked 1-2 packs over the past 10 years.   Past Medical History:  Diagnosis Date   Alcohol abuse 02/21/2014   Allergic rhinitis 01/31/2008   Qualifier: Diagnosis of  By: Ellen Shaw (Ellen Shaw), Ellen Shaw of this note might be different from the original. Overview:  Qualifier: Diagnosis of  By: Ellen Shaw Ellen Shaw), Ellen Shaw   Anxiety 07/22/2016   Arthritis 07/22/2016   Formatting of this note might be different from the original. Knees Formatting of this note might be different from the original. Overview:  Knees   Asthma 01/31/2008   Qualifier: Diagnosis of  By: Ellen Shaw Ellen Shaw), Ellen Shaw of this note might be different from the original. Overview:  Qualifier: Diagnosis of  By: Ellen Shaw Ellen Shaw), Ellen Shaw   ASTHMA 01/31/2008   Qualifier: Diagnosis of  By: Ellen Shaw Ellen Shaw), Ellen Shaw     Bell's palsy    age 45-14   Bilateral leg edema 02/15/2020   Body mass index (BMI) of 50.0 to 59.9 in adult [facility]) 01/06/2017   Chest discomfort 03/19/2020   Complex atypical endometrial hyperplasia 08/01/2013   Constipation 06/28/2018   DYSPNEA 01/31/2008   Qualifier: Diagnosis of  By: Ellen Loll MD, Ellen Shaw     Episodic mood disorder (HCC)  02/04/2012   Essential hypertension 11/Shaw/2012   Folliculitis 10/12/2017   GERD (gastroesophageal reflux disease) 9/Shaw/2014   History of Helicobacter pylori infection 03/03/2017   Hx of migraines 02/13/2020   Hyperlipidemia    Hypertension    Intertrigo 09/01/2017   Lumbosacral radiculopathy 09/01/2013   Major depressive disorder, recurrent episode (HCC) 05/02/2014   Medication overuse headache 07/27/2014   Migraine with aura and without status migrainosus, not intractable 07/27/2014   Formatting of this note might be different from the original. Follows with Dr. Weston Shaw in  Neurology. HX pseudotumor cerebri   Mixed stress and urge urinary incontinence 08/12/2013   Morbid obesity (HCC) 02/15/2020   Multiple allergies 02/13/2020   NSVT (nonsustained ventricular tachycardia) 03/19/2020   Obstructive sleep apnea on CPAP 12/27/2010   Formatting of this note might be different from the original. Uses Cpap  Formatting of this note might be different from the original. AHI=21.8 AutoPAP at 15-20 with an EPR=3  Last Assessment & Plan:  Formatting of this note might be different from the original. 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 Relevant Hx: Course: Daily Update: Today's Plan:  Electronically signed by: Ellen Speck, MD Resident 12/22/14 [phone]   Prediabetes 06/30/2017   Formatting of this note might be different from the original. A1C 6.1 on 06/2017   Primary hypertension 02/15/2020   Primary osteoarthritis of right knee 10/12/2012   Pseudotumor    PVC (premature ventricular contraction) 03/19/2020   Right bundle branch block 02/15/2020   Right cervical radiculopathy 07/18/2014   Sleep apnea    SVT (supraventricular tachycardia) (HCC) 07/15/2016   Tobacco use 12/03/2011   Umbilical cyst 06/02/2011   Urinary retention 08/19/2013   Formatting of this note might be different from the original. S/P interstim placement with Urology   Vulvovaginal candidiasis 03/03/2017     Family History  Problem Relation Age of Onset   COPD Father    Lung cancer Father    Congestive Heart Failure Father    Hypertension Father    Kidney disease Father    Atrial fibrillation Father    Macular degeneration Father    Glaucoma Father    Stroke Father     High Cholesterol Mother    Heart attack Maternal Grandmother    Lung cancer Paternal Grandmother      Social History   Socioeconomic History   Marital status: Single    Spouse name: Not on file   Number of children: Not on file   Years of education: Not on file   Highest education level: Not on file  Occupational History   Not on file  Tobacco Use   Smoking status: Former    Types: Cigarettes    Quit date: 01/19/2020    Years since quitting: 1.1   Smokeless tobacco: Never  Substance and Sexual Activity   Alcohol use: Not Currently   Drug use: No   Sexual activity: Not on file  Other Topics Concern   Not on file  Social History Narrative   Not on file   Social Determinants of Health   Financial Resource Strain: Not on file  Food Insecurity: Not on file  Transportation Needs:  Not on file  Physical Activity: Not on file  Stress: Not on file  Social Connections: Not on file  Intimate Partner Violence: Not on file     Allergies  Allergen Reactions   Ciprofloxacin Anaphylaxis, Rash and Shortness Of Breath    SOB    Clindamycin Shortness Of Breath   Esomeprazole Magnesium Hives and Shortness Of Breath   Nitrofurantoin Shortness Of Breath   Oxycodone Other (See Comments)    Other: "I passed out." Other: convulsions "shaking" Other reaction(s): Dizziness, Hypotension, Wheezing (ALLERGY/intolerance)     Sulfa Antibiotics Rash    Other reaction(s): Rash, Shortness of breath    Sulfamethoxazole-Trimethoprim Rash and Shortness Of Breath   Trimethoprim     Other reaction(s): Rash, Rash, Shortness of breath   Bee Pollen Other (See Comments)    Other reaction(s): Cough, Cough (ALLERGY/intolerance) Other-sneeze sneeze   Citrus Hives   Doxycycline Other (See Comments)    Muscle spasms  Bad muscle spasms    Fluticasone Other (See Comments) and Rash    headache Other reaction(s): Other (See Comments) "Headache and nosebleed" headache    Penicillins Rash    Pollen Extract Other (See Comments)    Stuffy nose Other-sneeze     Bee Venom Swelling    Shortness of breathing   Buspirone    Esomeprazole Magnesium    Fentanyl Other (See Comments)    "I pass out." Fainting  States "I passed out." fainting     Flonase [Fluticasone Propionate]     "Headache and nosebleed"   Fluticasone Propionate    Lexapro [Escitalopram]    Paliperidone Other (See Comments)    Other reaction(s): Dystonia   Pantoprazole Hives    Relieved with Benadryl   Orange Oil Rash     Outpatient Medications Prior to Visit  Medication Sig Dispense Refill   Cholecalciferol (VITAMIN D3) 1.25 MG (50000 UT) CAPS Take 1 capsule by mouth once a week.     EPINEPHrine 0.3 mg/0.3 mL IJ SOAJ injection Inject into the muscle as needed for anaphylaxis.     FEROSUL 325 (65 Fe) MG tablet Take 325 mg by mouth daily.     flecainide (TAMBOCOR) 50 MG tablet Take 1 tablet (50 mg total) by mouth 2 (two) times daily. 180 tablet 2   furosemide (LASIX) 20 MG tablet Take 1 tablet (20 mg total) by mouth 2 (two) times a week. (Tuesday and Saturday) 26 tablet 3   linaclotide (LINZESS) 290 MCG CAPS capsule Take 290 mcg by mouth daily before breakfast.     loratadine (CLARITIN) 10 MG tablet Take 10 mg by mouth daily.     Multiple Vitamin (MULTI-VITAMIN) tablet Take 1 tablet by mouth daily.     potassium chloride (KLOR-CON) 10 MEQ tablet Take 1 tablet (10 mEq total) by mouth once a week. 18 tablet 2   promethazine (PHENERGAN) 12.5 MG tablet Take 12.5 mg by mouth every 6 (six) hours as needed for nausea or vomiting.     propranolol (INDERAL) 10 MG tablet Take 1 tablet (10 mg total) by mouth at bedtime. 90 tablet 3   risperiDONE (RISPERDAL) 1 MG tablet Take 1.5 tablets by mouth at bedtime.     Sertraline HCl 150 MG CAPS Take 150 mg by mouth daily.     traZODone (DESYREL) 50 MG tablet Take by mouth at bedtime as needed for sleep.     Triamcinolone Acetonide (NASACORT AQ NA) Place into the nose.      vitamin B-12 (CYANOCOBALAMIN) 1000 MCG  tablet Take 1,000 mcg by mouth daily.     No facility-administered medications prior to visit.    Review of Systems  Constitutional:  Negative for chills, fever, malaise/fatigue and weight loss.  HENT:  Negative for congestion, sinus pain and sore throat.   Eyes: Negative.   Respiratory:  Positive for shortness of breath. Negative for cough, hemoptysis, sputum production and wheezing.   Cardiovascular:  Negative for chest pain, palpitations, orthopnea, claudication and leg swelling.  Gastrointestinal:  Negative for abdominal pain, heartburn, nausea and vomiting.  Genitourinary: Negative.   Musculoskeletal:  Negative for joint pain and myalgias.  Skin:  Negative for rash.  Neurological:  Negative for weakness.  Endo/Heme/Allergies: Negative.   Psychiatric/Behavioral: Negative.     Objective:   Vitals:   12/Shaw/22 1220  BP: 120/84  Pulse: 74  SpO2: 100%  Weight: (!) 346 lb (156.9 kg)  Height: 5\' 9"  (1.753 m)   Physical Exam Constitutional:      General: She is not in acute distress.    Appearance: She is obese. She is not ill-appearing.  HENT:     Head: Normocephalic and atraumatic.  Eyes:     General: No scleral icterus.    Conjunctiva/sclera: Conjunctivae normal.     Pupils: Pupils are equal, round, and reactive to light.  Cardiovascular:     Rate and Rhythm: Normal rate and regular rhythm.     Pulses: Normal pulses.     Heart sounds: Normal heart sounds. No murmur heard. Pulmonary:     Effort: Pulmonary effort is normal.     Breath sounds: Decreased breath sounds present. No wheezing, rhonchi or rales.  Musculoskeletal:     Right lower leg: No edema.     Left lower leg: No edema.  Skin:    General: Skin is warm and dry.  Neurological:     General: No focal deficit present.     Mental Status: She is alert.  Psychiatric:        Mood and Affect: Mood normal.        Behavior: Behavior normal.        Thought Content: Thought  content normal.        Judgment: Judgment normal.   CBC    Component Value Date/Time   WBC 15.3 (H) 07/11/2016 2036   RBC 4.65 07/11/2016 2036   HGB 13.8 07/11/2016 2036   HCT 41.6 07/11/2016 2036   PLT 345 07/11/2016 2036   MCV 89.5 07/11/2016 2036   MCH 29.7 07/11/2016 2036   MCHC 33.2 07/11/2016 2036   RDW 13.4 07/11/2016 2036   LYMPHSABS 2.8 08/02/2010 1501   MONOABS 0.7 08/02/2010 1501   EOSABS 0.4 08/02/2010 1501   BASOSABS 0.0 08/02/2010 1501   BMP Latest Ref Rng & Units 02/28/2021 10/15/2020 03/19/2020  Glucose 70 - 99 mg/dL 85 05/17/2020) 144(R)  BUN 6 - 24 mg/dL 16 7 6   Creatinine 0.57 - 1.00 mg/dL 154(M 0.86)  BUN/Creat Ratio 9 - 23 20 11 11   Sodium 134 - 144 mmol/L 139 141 140  Potassium 3.5 - 5.2 mmol/L 4.7 4.2 4.5  Chloride 96 - 106 mmol/L 100 100 101  CO2 20 - 29 mmol/L 25 30(H) 27  Calcium 8.7 - 10.2 mg/dL 9.9 9.7 9.2   Chest imaging: CTA Chest 03/01/21 No evidence of pulmonary embolism or other acute intrathoracic process.  HRCT Chest 02/05/21 1. No findings to suggest interstitial lung disease. No acute findings in the thorax to account for the patient's  symptoms.  CXR 07/02/20 The lungs are clear without focal pneumonia, edema, pneumothorax or pleural effusion. The cardiopericardial silhouette is within normal limits for size. Thoracolumbar scoliosis.   CTA Chest 12/30/19 1. No demonstrable pulmonary embolus. No thoracic aortic aneurysm or dissection. 2. Minimal left base atelectasis. Lungs otherwise clear. 3. No evident thoracic adenopathy.   PFT: PFT Results Latest Ref Rng & Units 12/Shaw/2022  FVC-Pre L 3.08  FVC-Predicted Pre % 71  FVC-Post L 3.22  FVC-Predicted Post % 74  Pre FEV1/FVC % % 83  Post FEV1/FCV % % 84  FEV1-Pre L 2.56  FEV1-Predicted Pre % 73  FEV1-Post L 2.69  DLCO uncorrected ml/min/mmHg 20.62  DLCO UNC% % 80  DLCO corrected ml/min/mmHg 20.Shaw  DLCO COR %Predicted % 78  DLVA Predicted % 103  TLC L 4.54  TLC % Predicted  % 78  RV % Predicted % 82    Labs:  Path:  Echo 03/13/2020: LV ef 60-65%. LV diastolic parameters are normal. RV systolic function is normal. RV size is normal.   Heart Catheterization:  Assessment & Plan:   Chronic hypoxemic respiratory failure (HCC)  Obesity hypoventilation syndrome (HCC)  Discussion: Syenna Nazir is a 40 year old woman, former smoker with obesity, obstructive sleep apnea on CPAP who developed chronic respiratory failure after having covid infections in 11/2019 and 07/2019.   She does not have parenchymal involvement from covid Shaw infections as HRCT chest from 01/2021 does not show post-covid findings or other interstitial findings.   PFTs today are improved from 06/2020 but she does have a mild restrictive defect which could be related to obesity.   Given patient's weight, we did discuss the concern for possible obesity hypoventilation syndrome which could be leading to her oxygen needs. We discussed the importance of weight loss and continuing to work with the weight management clinic.  We will check simple walk in clinic at follow up to assess her need for oxygen with activity.  Follow up in 3 months.  Melody Comas, MD [facility] Office: [phone]    Current Outpatient Medications:    Cholecalciferol (VITAMIN D3) 1.25 MG (50000 UT) CAPS, Take 1 capsule by mouth once a week., Disp: , Rfl:    EPINEPHrine 0.3 mg/0.3 mL IJ SOAJ injection, Inject into the muscle as needed for anaphylaxis., Disp: , Rfl:    FEROSUL 325 (65 Fe) MG tablet, Take 325 mg by mouth daily., Disp: , Rfl:    flecainide (TAMBOCOR) 50 MG tablet, Take 1 tablet (50 mg total) by mouth 2 (two) times daily., Disp: 180 tablet, Rfl: 2   furosemide (LASIX) 20 MG tablet, Take 1 tablet (20 mg total) by mouth 2 (two) times a week. (Tuesday and Saturday), Disp: 26 tablet, Rfl: 3   linaclotide (LINZESS) 290 MCG CAPS capsule, Take 290 mcg by mouth daily before breakfast.,  Disp: , Rfl:    loratadine (CLARITIN) 10 MG tablet, Take 10 mg by mouth daily., Disp: , Rfl:    Multiple Vitamin (MULTI-VITAMIN) tablet, Take 1 tablet by mouth daily., Disp: , Rfl:    potassium chloride (KLOR-CON) 10 MEQ tablet, Take 1 tablet (10 mEq total) by mouth once a week., Disp: 18 tablet, Rfl: 2   promethazine (PHENERGAN) 12.5 MG tablet, Take 12.5 mg by mouth every 6 (six) hours as needed for nausea or vomiting., Disp: , Rfl:    propranolol (INDERAL) 10 MG tablet, Take 1 tablet (10 mg total) by mouth at bedtime., Disp: 90 tablet, Rfl: 3  risperiDONE (RISPERDAL) 1 MG tablet, Take 1.5 tablets by mouth at bedtime., Disp: , Rfl:    Sertraline HCl 150 MG CAPS, Take 150 mg by mouth daily., Disp: , Rfl:    traZODone (DESYREL) 50 MG tablet, Take by mouth at bedtime as needed for sleep., Disp: , Rfl:    Triamcinolone Acetonide (NASACORT AQ NA), Place into the nose., Disp: , Rfl:    vitamin B-12 (CYANOCOBALAMIN) 1000 MCG tablet, Take 1,000 mcg by mouth daily., Disp: , Rfl:

## 2021-03-04 NOTE — Progress Notes (Signed)
Full PFT performed today. °

## 2021-03-04 NOTE — Patient Instructions (Signed)
Full PFT performed today. °

## 2021-03-04 NOTE — Patient Instructions (Addendum)
Good job on staying away from cigarettes!  Your pulmonary function tests are improving. You have a mild restrictive defect which we will monitor to see if you have further improvement with weight loss.  Your CT Chest scan does not show chronic scarring or damage from the previous covid infections.

## 2021-03-05 ENCOUNTER — Telehealth: Payer: Self-pay

## 2021-03-05 NOTE — Telephone Encounter (Signed)
Spoke with pt this morning, see chart.

## 2021-03-05 NOTE — Telephone Encounter (Signed)
After speaking with Dr. Servando Salina she suggests pt to hold propranolol since her heart rate is staying low. She also advised pt to reach out to her PCP or pulmonologist in regards to the drop in oxygen since it is out of her scope of practice. Pt verbalized understanding.

## 2021-03-05 NOTE — Telephone Encounter (Signed)
Spoke with patient regarding slow heart rate. Patient reported for the past week her heart rate will drop to the 30s for a few minutes 6-7 times a day, making her feel light-headed and sob. She also reports having midchest sharp pain when her her rate drops. Her O2 drops to the 80s on room air. She does use oxygen at 2L/m. She takes her medications as ordered and an extra propranolol in the mornings when she feels her heart beat "really hard". Her blood pressure this morning is 133/81 and stated that is her usual reading. She saw her pcp last Wednesday and was sent to the emergency room with "bigeminy". Patient verbalized understanding that if her symptoms worsen, she is to go back to the ER. Please advise.

## 2021-03-08 ENCOUNTER — Telehealth: Payer: Self-pay | Admitting: Pulmonary Disease

## 2021-03-08 NOTE — Telephone Encounter (Signed)
Fax received from Dr. Sibyl Parr with [facility] to perform a Septoplasty and turbinate redustion on patient.  Patient needs surgery clearance. Patient was seen on 03/04/2021. Office protocol is a risk assessment can be sent to surgeon if patient has been seen in 60 days or less.   Sending to Dr. Francine Graven for risk assessment

## 2021-03-14 NOTE — Telephone Encounter (Signed)
Ov notes and clearance form have been faxed back to [facility]. Nothing further needed at this time.

## 2021-03-15 ENCOUNTER — Encounter: Payer: Self-pay | Admitting: Cardiology

## 2021-03-15 LAB — BASIC METABOLIC PANEL
BUN/Creatinine Ratio: 20 (ref 9–23)
BUN: 16 mg/dL (ref 6–24)
CO2: 25 mmol/L (ref 20–29)
Calcium: 9.9 mg/dL (ref 8.7–10.2)
Chloride: 100 mmol/L (ref 96–106)
Creatinine, Ser: 0.82 mg/dL (ref 0.57–1.00)
Glucose: 85 mg/dL (ref 70–99)
Potassium: 4.7 mmol/L (ref 3.5–5.2)
Sodium: 139 mmol/L (ref 134–144)
eGFR: 92 mL/min/{1.73_m2} (ref >59)

## 2021-03-15 LAB — C-REACTIVE PROTEIN: CRP: <1 mg/L (ref 0–10)

## 2021-03-15 LAB — MAGNESIUM: Magnesium: 2.4 mg/dL — ABNORMAL HIGH (ref 1.6–2.3)

## 2021-03-15 LAB — FLECAINIDE LEVEL: Flecainide: <0.09 ug/ml — ABNORMAL LOW (ref 0.20–1.00)

## 2021-03-15 LAB — SEDIMENTATION RATE: Sed Rate: 21 mm/hr (ref 0–32)

## 2021-03-19 ENCOUNTER — Other Ambulatory Visit: Payer: Self-pay

## 2021-03-19 ENCOUNTER — Ambulatory Visit: Payer: Medicare Other

## 2021-03-19 DIAGNOSIS — R002 Palpitations: Secondary | ICD-10-CM

## 2021-03-19 MED ORDER — FLECAINIDE ACETATE 100 MG PO TABS: 100.0000 mg | ORAL_TABLET | Freq: Two times a day (BID) | ORAL | 3 refills | Status: DC

## 2021-03-19 NOTE — Progress Notes (Unsigned)
Enrolled patient for a 14 day Zio XT  monitor to be mailed to patients home  °

## 2021-03-19 NOTE — Telephone Encounter (Signed)
Spoke with patient, see chart.    

## 2021-03-19 NOTE — Progress Notes (Signed)
Prescription sent to pharmacy.

## 2021-03-19 NOTE — Progress Notes (Signed)
Zio order placed ?

## 2021-03-21 ENCOUNTER — Telehealth: Payer: Self-pay | Admitting: Cardiology

## 2021-03-21 NOTE — Telephone Encounter (Signed)
° °  Patient Name: Ellen Shaw  DOB: [birth year] MRN: [id number]  Primary Cardiologist: Berniece Salines, DO  Chart reviewed as part of pre-operative protocol coverage. Patient was last seen by Dr. Harriet Masson 02/28/21 for post-hospital follow-up and pleuritic chest pain. There was report of some bigeminy/VT by EMS en route to OSH but not mentioned in ED note per Dr. Harriet Masson. Pt also has hx outlined of calcium score of zero and no CAD by cor CT 03/2019, echo 02/2020 EF 60-65%, no significant valvular disease. Hx of PVCs, VT noted. She is on flecainide. Last note suggests this is for atrial fibrillation but could not find other details on this and is not on anticoagulation. Due to her pleuritic CP at last OV, CTA was obtained 03/01/21 showing no evidence of PE.  She has a 14 day monitor underway. Given recent symptoms and in-progress monitor, will route to Dr. Harriet Masson for input on clearing for ENT surgery as requested. Dr. Harriet Masson - Please route response to P CV DIV PREOP (the pre-op pool). Thank you.   [name], PA-C 03/21/2021, 11:46 AM

## 2021-03-21 NOTE — Telephone Encounter (Signed)
° ° ° °  Pre-operative Risk Assessment    Patient Name: Ellen Shaw  DOB: Apr 20, 1980 MRN: [id number]      Request for Surgical Clearance    Procedure:   feptoplasty and turbinate reduction  Date of Surgery:  Clearance TBD                                 Surgeon:  Dr. Nelda Severe Surgeon's Group or Practice Name:  [facility] Phone number:  [phone] Fax number:  [phone]   Type of Clearance Requested:   - Medical    Type of Anesthesia:   not sure   Additional requests/questions:   procedure requires maximum of 1 hour of anesthesia   Signed, Angeline Bufford Lope   03/21/2021, 8:56 AM

## 2021-03-22 NOTE — Telephone Encounter (Signed)
She can proceed with her surgery.  She does not need any further ischemic evaluation.

## 2021-03-22 NOTE — Telephone Encounter (Signed)
° °  Primary Cardiologist: Thomasene Ripple, DO  Chart reviewed as part of pre-operative protocol coverage. Given past medical history and time since last visit, based on ACC/AHA guidelines, Markeshia Giebel would be at acceptable risk for the planned procedure without further cardiovascular testing.   I will route this recommendation to the requesting party via Epic fax function and remove from pre-op pool.  Please call with questions.  Thomasene Ripple. Ariani Seier NP-C    03/22/2021, 9:56 AM [facility] [address] Office [phone] Fax [phone]

## 2021-03-27 NOTE — Telephone Encounter (Signed)
Elson Clan from [facility] calling to follow up on clearance. She states they did not receive the fax and requests it be sent again. She confirmed the fax is: [phone]

## 2021-03-28 NOTE — Telephone Encounter (Signed)
Document was refaxed and confirmation received

## 2021-04-04 DIAGNOSIS — J34829 Nasal valve collapse, unspecified: Secondary | ICD-10-CM | POA: Insufficient documentation

## 2021-04-04 DIAGNOSIS — J342 Deviated nasal septum: Secondary | ICD-10-CM | POA: Insufficient documentation

## 2021-04-04 DIAGNOSIS — J3489 Other specified disorders of nose and nasal sinuses: Secondary | ICD-10-CM | POA: Insufficient documentation

## 2021-04-25 ENCOUNTER — Ambulatory Visit: Payer: Medicare Other | Admitting: Cardiology

## 2021-05-14 ENCOUNTER — Encounter: Payer: Self-pay | Admitting: Pulmonary Disease

## 2021-05-15 NOTE — Telephone Encounter (Signed)
Please advise 

## 2021-05-22 ENCOUNTER — Ambulatory Visit: Payer: Medicare Other | Admitting: Dietician

## 2021-05-22 ENCOUNTER — Ambulatory Visit: Payer: Medicare Other | Admitting: Cardiology

## 2021-06-04 ENCOUNTER — Other Ambulatory Visit: Payer: Self-pay

## 2021-06-04 ENCOUNTER — Encounter: Payer: Self-pay | Admitting: Pulmonary Disease

## 2021-06-04 ENCOUNTER — Ambulatory Visit (INDEPENDENT_AMBULATORY_CARE_PROVIDER_SITE_OTHER): Payer: Medicare Other | Admitting: Pulmonary Disease

## 2021-06-04 VITALS — BP 128/76 | HR 86 | Ht 69.0 in | Wt 348.0 lb

## 2021-06-04 DIAGNOSIS — J9611 Chronic respiratory failure with hypoxia: Secondary | ICD-10-CM | POA: Diagnosis not present

## 2021-06-04 DIAGNOSIS — E662 Morbid (severe) obesity with alveolar hypoventilation: Secondary | ICD-10-CM

## 2021-06-04 NOTE — Patient Instructions (Addendum)
We will check your oxygen levels when you walk today. If you qualify for oxygen, we will send in qualification notice to your oxygen company.  ? ?Follow up in 9 months ? ? ?

## 2021-06-04 NOTE — Progress Notes (Signed)
? ?Synopsis: Referred in November 2022 for chronic hypoxemic respiratory failure by Graylon GunningKate Yates, PA ? ?Subjective:  ? ?PATIENT ID: Armando ReichertSarah Ellen Aguas GENDER: female DOB: 10-May-1980, MRN: [id number] ? ?HPI ? ?Chief Complaint  ?Patient presents with  ? Follow-up  ?  3 mo f/u. States she has been doing well since last visit.   ? ?Hurman HornSarah Livecchi is a 41 year old woman, former smoker with obesity, chronic hypoxemic respiratory failure and obstructive sleep apnea who returns to pulmonary clinic for follow up.  ? ?She has done well since last visit. She is working with a nutritionist on weight loss but has not lost any weight since the beginning of the year.  ? ?She has an upcoming surgery scheduled with ENT for deviated septum and nasal obstruction.  ? ?She is intolerant of CPAP therapy due to ear fullness for her OSA.  ? ?OV 03/04/2021 ?HRCT Chest from 02/05/21 did not show any parenchymal or airway issues. PFTs today show mild restrictive defect with normal diffusion capacity. There was slight improvement in FEV1, FVC, TLC and DLCO overall compared to prior PFTs from [facility].  ? ?She continues to have exertional dyspnea. Denies cough, wheezing or chest tightness. She reports episodes of her heart rate and a decline in SpO2. She does continue to have palpations despite being on flecainide and propranolol per cardiology.  ? ?She is being evaluated by [place] in [place] who is planning to operate on bilateral bone spurs of her nose.  ? ?She has not returned to smoking. She started going to the weight management clinic last month. ? ?OV 01/16/21 ?She was started on oxygen earlier this year by her primary care team due to SpO2 readings in the mid 80s on room air. She is currently on 1-2L of supplemental oxygen. She is using CPAP therapy at night and is followed at [facility] for her sleep apnea.  ? ?The oxygen need is new since being sick with covid 19 in 11/2019 and again 07/2020.  ? ?She had pulmonary function tests performed  07/05/20 which showed FEV1 2.58L (74%), FVC 3.12L (72%), ratio 83, TLC 4.14L (71%), DLCO 30.99 (74%). ? ?She was evaluated by Dr. Eual Finesutta of pulmonary at [facility] on 07/12/20. She was referred to pulmonary rehab at [facility] and is waiting to be enrolled in this program. They trialed her on anoro ellipta but she did not tolerate this medicine due to chest discomfort. They recommended weight loss. ? ?She is seeing a nutritionist at this time to work on weight loss.  ? ?She quit smoking 04/2020 and smoked 1-2 packs over the past 10 years.  ? ?Past Medical History:  ?Diagnosis Date  ? Alcohol abuse 02/21/2014  ? Allergic rhinitis 01/31/2008  ? Qualifier: Diagnosis of  By: Truman HaywardFord,CMA Duncan Dull(AAMA), Epifanio LeschesSusanne    Formatting of this note might be different from the original. Overview:  Qualifier: Diagnosis of  By: Truman HaywardFord,CMA Duncan Dull(AAMA), Lynnae SandhoffSusanne  ? Anxiety 07/22/2016  ? Arthritis 07/22/2016  ? Formatting of this note might be different from the original. Knees Formatting of this note might be different from the original. Overview:  Knees  ? Asthma 01/31/2008  ? Qualifier: Diagnosis of  By: Truman HaywardFord,CMA Duncan Dull(AAMA), Epifanio LeschesSusanne    Formatting of this note might be different from the original. Overview:  Qualifier: Diagnosis of  By: Truman HaywardFord,CMA Duncan Dull(AAMA), Lynnae SandhoffSusanne  ? ASTHMA 01/31/2008  ? Qualifier: Diagnosis of  By: Truman HaywardFord,CMA Duncan Dull(AAMA), Lynnae SandhoffSusanne    ? Bell's palsy   ? age [age]  ?  Bilateral leg edema 02/15/2020  ? Body mass index (BMI) of 50.0 to 59.9 in adult [facility]) 01/06/2017  ? Chest discomfort 03/19/2020  ? Complex atypical endometrial hyperplasia 08/01/2013  ? Constipation 06/28/2018  ? DYSPNEA 01/31/2008  ? Qualifier: Diagnosis of  By: Vassie Loll MD, Comer Locket.    ? Episodic mood disorder (HCC) 02/04/2012  ? Essential hypertension 02/03/2011  ? Folliculitis 10/12/2017  ? GERD (gastroesophageal reflux disease) 12/03/2012  ? History of Helicobacter pylori infection 03/03/2017  ? Hx of migraines 02/13/2020  ? Hyperlipidemia   ? Hypertension   ? Intertrigo 09/01/2017  ?  Lumbosacral radiculopathy 09/01/2013  ? Major depressive disorder, recurrent episode (HCC) 05/02/2014  ? Medication overuse headache 07/27/2014  ? Migraine with aura and without status migrainosus, not intractable 07/27/2014  ? Formatting of this note might be different from the original. Follows with Dr. Weston Settle in Neurology. HX pseudotumor cerebri  ? Mixed stress and urge urinary incontinence 08/12/2013  ? Morbid obesity (HCC) 02/15/2020  ? Multiple allergies 02/13/2020  ? NSVT (nonsustained ventricular tachycardia) 03/19/2020  ? Obstructive sleep apnea on CPAP 12/27/2010  ? Formatting of this note might be different from the original. Uses Cpap  Formatting of this note might be different from the original. AHI=21.8 AutoPAP at 15-20 with an EPR=3  Last Assessment & Plan:  Formatting of this note might be different from the original. Relevant Hx: Course: Daily Update: Today's Plan:  ? 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 Palpitations 02/15/2020  ? Patellofemoral pain syndrome 10/12/2012  ? Personal history of other diseases of the nervous system and sense organs 03/18/1995  ? Posttraumatic stress disorder 02/04/2012  ? Last Assessment & Plan:  Formatting of this note might be different from the original. Relevant Hx: Course: Daily Update: Today's Plan:  Electronically signed by: Seward Speck, MD Resident 12/22/14 [phone]  ? Prediabetes 06/30/2017  ? Formatting of this note might be different from the original. A1C 6.1 on 06/2017  ? Primary hypertension 02/15/2020  ? Primary osteoarthritis of right knee 10/12/2012  ? Pseudotumor   ? PVC (premature ventricular contraction) 03/19/2020  ? Right bundle branch block 02/15/2020  ? Right cervical radiculopathy 07/18/2014  ? Sleep apnea   ? SVT (supraventricular tachycardia) (HCC) 07/15/2016  ? Tobacco use 12/03/2011  ? Umbilical cyst 06/02/2011  ? Urinary retention 08/19/2013  ? Formatting of this note might be different from the original. S/P interstim placement with Urology  ?  Vulvovaginal candidiasis 03/03/2017  ?  ? ?Family History  ?Problem Relation Age of Onset  ? COPD Father   ? Lung cancer Father   ? Congestive Heart Failure Father   ? Hypertension Father   ? Kidney disease Father   ? Atrial fibrillation Father   ? Macular degeneration Father   ? Glaucoma Father   ? Stroke Father   ? High Cholesterol Mother   ? Heart attack Maternal Grandmother   ? Lung cancer Paternal Grandmother   ?  ? ?Social History  ? ?Socioeconomic History  ? Marital status: Single  ?  Spouse name: Not on file  ? Number of children: Not on file  ? Years of education: Not on file  ? Highest education level: Not on file  ?Occupational History  ? Not on file  ?Tobacco Use  ? Smoking status: Former  ?  Types: Cigarettes  ?  Quit date: 01/19/2020  ?  Years since quitting: 1.3  ? Smokeless tobacco: Never  ?Substance and Sexual Activity  ?  Alcohol use: Not Currently  ? Drug use: No  ? Sexual activity: Not on file  ?Other Topics Concern  ? Not on file  ?Social History Narrative  ? Not on file  ? ?Social Determinants of Health  ? ?Financial Resource Strain: Not on file  ?Food Insecurity: Not on file  ?Transportation Needs: Not on file  ?Physical Activity: Not on file  ?Stress: Not on file  ?Social Connections: Not on file  ?Intimate Partner Violence: Not on file  ?  ? ?Allergies  ?Allergen Reactions  ? Ciprofloxacin Anaphylaxis, Rash and Shortness Of Breath  ?  SOB ?  ? Clindamycin Shortness Of Breath  ? Esomeprazole Magnesium Hives and Shortness Of Breath  ? Nitrofurantoin Shortness Of Breath  ? Oxycodone Other (See Comments)  ?  Other: "I passed out." ?Other: convulsions ?"shaking" ?Other reaction(s): Dizziness, Hypotension, Wheezing (ALLERGY/intolerance) ? ?  ? Sulfa Antibiotics Rash  ?  Other reaction(s): Rash, Shortness of breath ?  ? Sulfamethoxazole-Trimethoprim Rash and Shortness Of Breath  ? Trimethoprim   ?  Other reaction(s): Rash, Rash, Shortness of breath  ? Bee Pollen Other (See Comments)  ?  Other  reaction(s): Cough, Cough (ALLERGY/intolerance) ?Other-sneeze ?sneeze  ? Citrus Hives  ? Doxycycline Other (See Comments)  ?  Muscle spasms  ?Bad muscle spasms ?  ? Fluticasone Other (See Comments) and Rash  ?  heada

## 2021-07-04 ENCOUNTER — Encounter: Payer: Self-pay | Admitting: Cardiology

## 2021-07-04 ENCOUNTER — Ambulatory Visit (INDEPENDENT_AMBULATORY_CARE_PROVIDER_SITE_OTHER): Payer: Medicare Other | Admitting: Cardiology

## 2021-07-04 VITALS — BP 126/80 | HR 94 | Ht 69.0 in | Wt 354.8 lb

## 2021-07-04 DIAGNOSIS — R600 Localized edema: Secondary | ICD-10-CM

## 2021-07-04 DIAGNOSIS — R7303 Prediabetes: Secondary | ICD-10-CM

## 2021-07-04 DIAGNOSIS — I10 Essential (primary) hypertension: Secondary | ICD-10-CM

## 2021-07-04 DIAGNOSIS — I471 Supraventricular tachycardia: Secondary | ICD-10-CM | POA: Diagnosis not present

## 2021-07-04 DIAGNOSIS — Z79899 Other long term (current) drug therapy: Secondary | ICD-10-CM

## 2021-07-04 MED ORDER — POTASSIUM CHLORIDE CRYS ER 20 MEQ PO TBCR: 20.0000 meq | EXTENDED_RELEASE_TABLET | ORAL | 3 refills | Status: DC

## 2021-07-04 MED ORDER — FUROSEMIDE 40 MG PO TABS: 40.0000 mg | ORAL_TABLET | ORAL | 3 refills | Status: DC

## 2021-07-04 NOTE — Progress Notes (Signed)
?Cardiology Office Note:   ? ?Date:  07/05/2021  ? ?ID:  Ellen Shaw, DOB 08/17/1980, MRN [id number] ? ?PCP:  Marlyn CorporalYates, Kate H, PA  ?Cardiologist:  Thomasene RippleKardie Dannah Ryles, DO  ?Electrophysiologist:  None  ? ?Referring MD: Marlyn CorporalYates, Kate H, PA  ? ?" I am having shortness of breath" ? ? ?History of Present Illness:   ? ?Ellen Shaw is a 41 y.o. female with a hx of chronic respiratory failure now on oxygen managed by her primary care doctor, hypertension, morbid obesity, nonsustained ventricular tachycardia on the monitor, recurrent palpitations here today for follow-up visit. ?  ?I saw the patient 1 March 20, 2019 at that time she had some chest pain and based on her I did have some multiple episodes of nonsustained ventricular tachycardia I recommended she undergo a coronary CTA to rule out coronary artery disease. ?  ?I saw the patient on April 12, 2019 we discussed her CT results which showed 0 calcium with no evidence of coronary artery disease.  She did tell me that her palpitations were not being helped by the Cardizem.  We stopped the Cardizem and start the patient on propanolol 20 mg at nighttime and flecainide 50 mg twice a day. ?  ?  ?I saw the patient on October 15, 2020 at that time she had she started oxygen.  She was doing well with this.  During that time we restarted her propanolol 10 mg daily due to low blood pressure and we kept her on the flecainide twice a day. ?  ?I saw the patient on January 07, 2021 at that time she was doing well she has some leg swelling and her palpitations have improved significantly. ?  ?In the interim the patient was doing well.  But has been following with the cardio Valorie Rooseveltroctor who has been treating her for possible bulging disks.  The chiropractor recommended that she go to the emergency department initially on February 22, 2021 at which time she was experiencing left upper back pain spasm as well as left arm weakness.  At that time she described her muscle spasms that he was on  and off which started back in November.  During that visit all of her work-up was negative she had a lumbar spine x-ray which showed evidence of moderate this changes at L5-S1 as well as L4-L5.  There was also evidence of chronic lumbar hyperlordosis.  At that time she also had a shoulder x-ray which showed all of her joint spaces were maintained. ?  ?At her visit on February 28, 2021 she was status post hospitalization where she was noted to be in SVT.  At that visit she did have some palpitations.  She was having some pleuritic chest pain as sent the patient for CTA to rule out pulmonary embolism.  She did get the imaging there is no evidence of pulmonary embolism. ? ?Since I saw the patient she has seen [facility]ology [facility].  She was seen there in January 6 she had complained of some chest pain and palpitations.  No testing was actually done as they have review all of the testing will be done here at [facility]. ? ?Today she tells me she is experiencing significant leg swelling. ? ? ?Past Medical History:  ?Diagnosis Date  ? Alcohol abuse 02/21/2014  ? Allergic rhinitis 01/31/2008  ? Qualifier: Diagnosis of  By: Truman HaywardFord,CMA Duncan Dull(AAMA), Epifanio LeschesSusanne    Formatting of this note might be different from the original. Overview:  Qualifier:  Diagnosis of  By: Truman Hayward Duncan Dull), Lynnae Sandhoff  ? Anxiety 07/22/2016  ? Arthritis 07/22/2016  ? Formatting of this note might be different from the original. Knees Formatting of this note might be different from the original. Overview:  Knees  ? Asthma 01/31/2008  ? Qualifier: Diagnosis of  By: Truman Hayward Duncan Dull), Epifanio Lesches of this note might be different from the original. Overview:  Qualifier: Diagnosis of  By: Truman Hayward Duncan Dull), Lynnae Sandhoff  ? ASTHMA 01/31/2008  ? Qualifier: Diagnosis of  By: Truman Hayward Duncan Dull), Lynnae Sandhoff    ? Bell's palsy   ? age 34-14  ? Bilateral leg edema 02/15/2020  ? Body mass index (BMI) of 50.0 to 59.9 in adult [facility]) 01/06/2017  ? Chest discomfort 03/19/2020  ? Complex  atypical endometrial hyperplasia 08/01/2013  ? Constipation 06/28/2018  ? DYSPNEA 01/31/2008  ? Qualifier: Diagnosis of  By: Vassie Loll MD, [place]ocket.    ? Episodic mood disorder (HCC) 02/04/2012  ? Essential hypertension 02/03/2011  ? Folliculitis 10/12/2017  ? GERD (gastroesophageal reflux disease) 12/03/2012  ? History of Helicobacter pylori infection 03/03/2017  ? Hx of migraines 02/13/2020  ? Hyperlipidemia   ? Hypertension   ? Intertrigo 09/01/2017  ? Lumbosacral radiculopathy 09/01/2013  ? Major depressive disorder, recurrent episode (HCC) 05/02/2014  ? Medication overuse headache 07/27/2014  ? Migraine with aura and without status migrainosus, not intractable 07/27/2014  ? Formatting of this note might be different from the original. Follows with Dr. Weston Settle in Neurology. HX pseudotumor cerebri  ? Mixed stress and urge urinary incontinence 08/12/2013  ? Morbid obesity (HCC) 02/15/2020  ? Multiple allergies 02/13/2020  ? NSVT (nonsustained ventricular tachycardia) (HCC) 03/19/2020  ? Obstructive sleep apnea on CPAP 12/27/2010  ? Formatting of this note might be different from the original. Uses Cpap  Formatting of this note might be different from the original. AHI=21.8 AutoPAP at 15-20 with an EPR=3  Last Assessment & Plan:  Formatting of this note might be different from the original. Relevant Hx: Course: Daily Update: Today's Plan:  ? 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 Palpitations 02/15/2020  ? Patellofemoral pain syndrome 10/12/2012  ? Personal history of other diseases of the nervous system and sense organs 03/18/1995  ? Posttraumatic stress disorder 02/04/2012  ? Last Assessment & Plan:  Formatting of this note might be different from the original. Relevant Hx: Course: Daily Update: Today's Plan:  Electronically signed by: Seward Speck, MD Resident 12/22/14 [phone]  ? Prediabetes 06/30/2017  ? Formatting of this note might be different from the original. A1C 6.1 on 06/2017  ? Primary hypertension 02/15/2020  ?  Primary osteoarthritis of right knee 10/12/2012  ? Pseudotumor   ? PVC (premature ventricular contraction) 03/19/2020  ? Right bundle branch block 02/15/2020  ? Right cervical radiculopathy 07/18/2014  ? Sleep apnea   ? SVT (supraventricular tachycardia) (HCC) 07/15/2016  ? Tobacco use 12/03/2011  ? Umbilical cyst 06/02/2011  ? Urinary retention 08/19/2013  ? Formatting of this note might be different from the original. S/P interstim placement with Urology  ? Vulvovaginal candidiasis 03/03/2017  ? ? ?Past Surgical History:  ?Procedure Laterality Date  ? BLADDER SURGERY    ? TONSILLECTOMY AND ADENOIDECTOMY    ? VAGINAL HYSTERECTOMY  2015  ? ? ?Current Medications: ?Current Meds  ?Medication Sig  ? Cholecalciferol (VITAMIN D3) 1.25 MG (50000 UT) CAPS Take 1 capsule by mouth once a week.  ? EPINEPHrine 0.3 mg/0.3 mL IJ SOAJ injection Inject into the muscle  as needed for anaphylaxis.  ? FARXIGA 5 MG TABS tablet Take 5 mg by mouth every morning.  ? FEROSUL 325 (65 Fe) MG tablet Take 325 mg by mouth daily.  ? flecainide (TAMBOCOR) 100 MG tablet Take 1 tablet (100 mg total) by mouth 2 (two) times daily.  ? furosemide (LASIX) 40 MG tablet Take 1 tablet (40 mg total) by mouth every other day.  ? linaclotide (LINZESS) 290 MCG CAPS capsule Take 290 mcg by mouth daily before breakfast.  ? loratadine (CLARITIN) 10 MG tablet Take 10 mg by mouth daily.  ? Multiple Vitamin (MULTI-VITAMIN) tablet Take 1 tablet by mouth daily.  ? potassium chloride SA (KLOR-CON M20) 20 MEQ tablet Take 1 tablet (20 mEq total) by mouth every other day.  ? promethazine (PHENERGAN) 12.5 MG tablet Take 12.5 mg by mouth every 6 (six) hours as needed for nausea or vomiting.  ? propranolol (INDERAL) 10 MG tablet Take 1 tablet (10 mg total) by mouth at bedtime.  ? risperiDONE (RISPERDAL) 1 MG tablet Take 1.5 tablets by mouth at bedtime.  ? Sertraline HCl 150 MG CAPS Take 150 mg by mouth daily.  ? traZODone (DESYREL) 50 MG tablet Take by mouth at bedtime as needed for  sleep.  ? Triamcinolone Acetonide (NASACORT AQ NA) Place into the nose.  ? vitamin B-12 (CYANOCOBALAMIN) 1000 MCG tablet Take 1,000 mcg by mouth daily.  ? [DISCONTINUED] furosemide (LASIX) 20 MG tablet Take 1 tablet

## 2021-07-04 NOTE — Patient Instructions (Addendum)
Medication Instructions:  ?Your physician has recommended you make the following change in your medication:  ?START: Lasix 40 mg every other day ?START: Potassium 20 mEq every other day ?*If you need a refill on your cardiac medications before your next appointment, please call your pharmacy* ? ? ?Lab Work: ?Your physician recommends that you return for lab work in:  ?TODAY: BMET, Mag, Flecainide  ?If you have labs (blood work) drawn today and your tests are completely normal, you will receive your results only by: ?MyChart Message (if you have MyChart) OR ?A paper copy in the mail ?If you have any lab test that is abnormal or we need to change your treatment, we will call you to review the results. ? ? ?Testing/Procedures: ?None ? ? ?Follow-Up: ?At [facility], you and your health needs are our priority.  As part of our continuing mission to provide you with exceptional heart care, we have created designated Provider Care Teams.  These Care Teams include your primary Cardiologist (physician) and Advanced Practice Providers (APPs -  Physician Assistants and Nurse Practitioners) who all work together to provide you with the care you need, when you need it. ? ?We recommend signing up for the patient portal called "MyChart".  Sign up information is provided on this After Visit Summary.  MyChart is used to connect with patients for Virtual Visits (Telemedicine).  Patients are able to view lab/test results, encounter notes, upcoming appointments, etc.  Non-urgent messages can be sent to your provider as well.   ?To learn more about what you can do with MyChart, go to [email or web address].   ? ?Your next appointment:   ?4 month(s) ? ?The format for your next appointment:   ?In Person ? ?Provider:   ?Thomasene Ripple, DO   ? ? ?Other Instructions ? ? ?Important Information About Sugar ? ? ? ? ?  ?

## 2021-07-09 ENCOUNTER — Encounter: Payer: Self-pay | Admitting: Dietician

## 2021-07-09 ENCOUNTER — Encounter: Payer: Medicare Other | Attending: Cardiology | Admitting: Dietician

## 2021-07-09 DIAGNOSIS — R7303 Prediabetes: Secondary | ICD-10-CM | POA: Insufficient documentation

## 2021-07-09 DIAGNOSIS — Z713 Dietary counseling and surveillance: Secondary | ICD-10-CM | POA: Diagnosis not present

## 2021-07-09 DIAGNOSIS — Z6841 Body Mass Index (BMI) 40.0 and over, adult: Secondary | ICD-10-CM | POA: Insufficient documentation

## 2021-07-09 DIAGNOSIS — I10 Essential (primary) hypertension: Secondary | ICD-10-CM | POA: Diagnosis not present

## 2021-07-09 DIAGNOSIS — G4733 Obstructive sleep apnea (adult) (pediatric): Secondary | ICD-10-CM | POA: Diagnosis not present

## 2021-07-09 DIAGNOSIS — K219 Gastro-esophageal reflux disease without esophagitis: Secondary | ICD-10-CM | POA: Diagnosis not present

## 2021-07-09 DIAGNOSIS — E785 Hyperlipidemia, unspecified: Secondary | ICD-10-CM | POA: Diagnosis not present

## 2021-07-09 NOTE — Progress Notes (Signed)
Medical Nutrition Therapy  ?Appointment Start time:  1400  Appointment End time:  1430 ? ?Primary concerns today: Weight loss, glycemic control.  ?Referral diagnosis: E66.01 - Morbid Obesity ?Preferred learning style: No preference indicated ?Learning readiness: Ready ? ? ?NUTRITION ASSESSMENT  ? ?Anthropometrics  ?Ht: 5'9" ?Wt: 352.7 lbs ?Wt Change: +5 lbs ?There is no height or weight on file to calculate BMI. ?  ? ?Clinical ?Medical Hx: HTN, HLD, GERD, Prediabetes, Asthma ?Medications: See list ?Labs: A1c - 6.5 (04/2021), LDL - 116 ?Notable Signs/Symptoms: N/A ? ? ?Lifestyle & Dietary Hx ?Pt reports having to go to the hospital for excessive fluid retention in their lower leg. Pt doctor increased their Lasix and KlorCon to every other day. Pt states they have lost 7 lbs of fluid in the last week. Pt is mostly off of supplemental oxygen, will use it they go to walk or notice their blood oxygen is getting low.  ?Pt reports moving back home with their dog this past week. ?Pt reports a decline in physical activity due to 4 procedures and 2 surgeries this year. Pt reports improvement in back pain from nerve ablation, and it is easier to breathe through their nose from surgery. Pt is looking to restart at the gym in the next week.  ?Pt reports binging and restricting over the past week, high stress related to family. Pt states they would get stressed and over-consume for a day, then they would get depressed and not eat at all without eating the next day.  ?Pt has been trying controlled breathing, coloring, listening to music, and walking their dog to control stress. Pt is working with a therapist every 1-2 weeks, and peer support twice a week. ?Pt reports that they quit smoking 4 weeks ago, hasn't smoked since. ? ?Food allergies: Oranges (Breaks out in a rash), pickles and tomatoes (severe GI pain due to seeds) ? ?Estimated daily fluid intake: 50 oz ?Supplements: Daily MV, Vitamin D (50,000 IU), B-12 ?Sleep: OSA, uses  CPAP ?Stress / self-care: Usually 6/10, family troubles caring for mother ?Current average weekly physical activity: ADL ? ? ?24-Hr Dietary Recall ?First Meal:  ?Snack: none ?Second Meal: Applesauce ?Snack: none ?Third Meal: Cap'n Crunch, whole milk ?Snack: none ?Beverages: coffee, water ? ? ?NUTRITION DIAGNOSIS  ?NB-1.1 Food and nutrition-related knowledge deficit As related to obesity.  As evidenced by BMI of 48.82 kg/m2, limited physical activity, and dinner meals high in starches.. ? ? ?NUTRITION INTERVENTION  ?Nutrition education (E-1) on the following topics:  ?Educated patient on the balanced plate eating model. Recommended lunch and dinner be 1/2 non-starchy vegetables, 1/4 starches, and 1/4 protein. Recommended breakfast be a balance of starch and protein with a piece of fruit. Discussed with patient the importance of working towards hitting the proportions of the balanced plate consistently.  ?Educated patient on factors that contribute to elevation of blood sugars, such as stress, illness, injury,and food choices. Discussed the role that physical activity plays in lowering blood sugar. Educate patient on the three main macronutrients. Protein, fats, and carbohydrates. Discussed how each of these macronutrients affect blood sugar levels, especially carbohydrate, and the importance of eating a consistent amount of carbohydrate throughout the day.  ? ? ?Handouts Provided Include  ?Balanced Plate ?Balanced Plate Food List ? ?Learning Style & Readiness for Change ?Teaching method utilized: Visual & Auditory  ?Demonstrated degree of understanding via: Teach Back  ?Barriers to learning/adherence to lifestyle change: Limited capacity for physical activity ? ?Goals Established by Pt ?Try  switching to 2% milk from whole milk. ?Start your day with a Glucerna for breakfast. ?Continue to be more physically active as your health is improving.  ?Start by going to the gym one day next week. ?Walk your dog as much as  possible. Set your step goal to 6,000 - 7,000 a day. ?Look for "Steam In Bag" frozen vegetables in the freezer aisle. Keep a stash in your freezer! ?Congratulations on quitting smoking!! ?Talk with your therapist about using sweets to replace cravings for cigarettes. Think about what things drive you to have these cravings, and what type of satisfaction they actually provide. ? ? ?MONITORING & EVALUATION ?Dietary intake, weekly physical activity, and weight PRN ? ?Next Steps  ?Patient is to follow with [facility]. ? ?

## 2021-07-09 NOTE — Patient Instructions (Addendum)
Try switching to 2% milk from whole milk. ? ?Start your day with a Glucerna for breakfast. ? ?Continue to be more physically active as your health is improving. Start by going to the gym one day next week. ?Walk your dog as much as possible. Set your step goal to 6,000 - 7,000 a day. ? ?Look for "Steam In Bag" frozen vegetables in the freezer aisle. Keep a stash in your freezer! ? ?Congratulations on quitting smoking!! ? ?Talk with your therapist about using sweets to replace cravings for cigarettes. Think about what things drive you to have these cravings, and what type of satisfaction they actually provide. ?

## 2021-07-19 LAB — BASIC METABOLIC PANEL WITH GFR
BUN/Creatinine Ratio: 19 (ref 9–23)
BUN: 11 mg/dL (ref 6–24)
CO2: 28 mmol/L (ref 20–29)
Calcium: 9.2 mg/dL (ref 8.7–10.2)
Chloride: 98 mmol/L (ref 96–106)
Creatinine, Ser: 0.58 mg/dL (ref 0.57–1.00)
Glucose: 162 mg/dL — ABNORMAL HIGH (ref 70–99)
Potassium: 3.8 mmol/L (ref 3.5–5.2)
Sodium: 139 mmol/L (ref 134–144)
eGFR: 116 mL/min/{1.73_m2}

## 2021-07-19 LAB — MAGNESIUM: Magnesium: 1.9 mg/dL (ref 1.6–2.3)

## 2021-07-19 LAB — FLECAINIDE LEVEL: Flecainide: <0.09 ug/mL — ABNORMAL LOW (ref 0.20–1.00)

## 2021-07-25 ENCOUNTER — Other Ambulatory Visit: Payer: Self-pay

## 2021-07-25 MED ORDER — FLECAINIDE ACETATE 150 MG PO TABS: 150.0000 mg | ORAL_TABLET | Freq: Two times a day (BID) | ORAL | 3 refills | Status: DC

## 2021-07-25 NOTE — Progress Notes (Signed)
Prescription sent to pharmacy.

## 2021-08-21 ENCOUNTER — Encounter: Payer: Self-pay | Admitting: Skilled Nursing Facility1

## 2021-08-21 ENCOUNTER — Encounter: Payer: Medicare Other | Attending: Cardiology | Admitting: Skilled Nursing Facility1

## 2021-08-21 DIAGNOSIS — E119 Type 2 diabetes mellitus without complications: Secondary | ICD-10-CM | POA: Insufficient documentation

## 2021-08-21 NOTE — Progress Notes (Signed)
Medical Nutrition Therapy    Primary concerns today: Weight loss, glycemic control.  Referral diagnosis: E66.01 - Morbid Obesity Preferred learning style: No preference indicated Learning readiness: Ready   NUTRITION ASSESSMENT   Anthropometrics  Ht: 5'9" Wt: 351 lbs Body mass index is 51.88 kg/m.    Clinical Medical Hx: HTN, HLD, GERD, diabetes, Asthma Medications: See list Labs: A1c - 6.5, LDL - 109, triglycerides 164 Notable Signs/Symptoms: N/A   Lifestyle & Dietary Hx  Pt states she did quit but then started it again but then quit again using patches currently. Pt states she started again due to her friend dieing in an accident but then after the funeral quit again.    Pt state she desires to get her sugar under control and lose weight and learn the things that are good and the things that ain't so good.  Pt states her A1C was retested and it is still 6.5. Pt state she checks her blood sugars 2-3 times a day: fasting but having drank black coffee: 125-140; before dinner: 125-145  Pt states she is an emotional eater and when experiencing a stressful day will eat a bunch of stuff and then when feeling depressed will not eat. Pt states the derepression is more under control now since an increase in her medication which keeps her from having too bad of days.   Pt states her IBS-C is pretty controlled with no issues as long as she takes her Linzess.   Pt states she recently started farxiga and feels that is going well.   Pt states  she does do therapy 1-2 times a week. Pt states her binging is doing better not doing as often due to working on coping skills and learning to pay attention more trying to identify before she conducts the behavior.   Pt states it has hard to get into eating non starchy vegetables but has been working on it.  Pt states she does not know how to cook. Pt states lives alone but lives close to her mom.   Pt states she does like green beans and other  beans and broccoli and onion and cucumbers and peppers     Food allergies: Oranges (Breaks out in a rash), pickles and tomatoes (severe GI pain due to seeds)  Estimated daily fluid intake: 50 oz Supplements: Daily MV, Vitamin D (50,000 IU), B-12 Sleep: OSA, uses CPAP Stress / self-care: Usually 6/10, family troubles caring for mother Current average weekly physical activity: ADL   24-Hr Dietary Recall First Meal: peanut butter crackers Snack: none Second Meal: peanut butter sandwich or [place] and mayo or hamburger Snack: none Third Meal: Cap'n Crunch, whole milk or green beans and potatoes Snack: none Beverages: coffee, water   NUTRITION DIAGNOSIS  NB-1.1 Food and nutrition-related knowledge deficit As related to obesity.  As evidenced by BMI of 51.88 kg/m2, limited physical activity, and dinner meals high in starches..   NUTRITION INTERVENTION  Nutrition education (E-1) on the following topics:  Educated patient on the balanced plate eating model. Recommended lunch and dinner be 1/2 non-starchy vegetables, 1/4 starches, and 1/4 protein. Recommended breakfast be a balance of starch and protein with a piece of fruit. Discussed with patient the importance of working towards hitting the proportions of the balanced plate consistently.  Educated patient on factors that contribute to elevation of blood sugars, such as stress, illness, injury,and food choices. Discussed the role that physical activity plays in lowering blood sugar. Educate patient on the three main  macronutrients. Protein, fats, and carbohydrates. Discussed how each of these macronutrients affect blood sugar levels, especially carbohydrate, and the importance of eating a consistent amount of carbohydrate throughout the day.    Handouts Provided Include  Balanced Plate Balanced Plate Food List Mindful meals exercise Should I eat sheet  Learning Style & Readiness for Change Teaching method utilized: Visual & Auditory   Demonstrated degree of understanding via: Teach Back  Barriers to learning/adherence to lifestyle change: Limited capacity for physical activity  Goals Established by Pt Try switching to 2% milk from whole milk. Start your day with a Glucerna for breakfast. Continue to be more physically active as your health is improving.  Start by going to the gym one day next week. Walk your dog as much as possible. Set your step goal to 6,000 - 7,000 a day. Continue: Look for "Steam In Bag" frozen vegetables in the freezer aisle. Keep a stash in your freezer! Continue: Congratulations on quitting smoking!! Talk with your therapist about using sweets to replace cravings for cigarettes. Think about what things drive you to have these cravings, and what type of satisfaction they actually provide. NEW: ask your mom to go teach you how to cook the recipes provided  NEW: eat one of the vegetables you like daily NEW: try peppers and onions in eggs for breakfast   MONITORING & EVALUATION Dietary intake, weekly physical activity, and weight PRN  Next Steps  Patient is to follow with [facility] 2 months

## 2021-10-21 ENCOUNTER — Encounter: Payer: Self-pay | Admitting: Cardiology

## 2021-10-21 ENCOUNTER — Ambulatory Visit (INDEPENDENT_AMBULATORY_CARE_PROVIDER_SITE_OTHER): Payer: Medicare Other | Admitting: Cardiology

## 2021-10-21 VITALS — BP 112/74 | HR 77 | Ht 69.0 in | Wt 332.8 lb

## 2021-10-21 DIAGNOSIS — I4729 Other ventricular tachycardia: Secondary | ICD-10-CM

## 2021-10-21 DIAGNOSIS — Z9989 Dependence on other enabling machines and devices: Secondary | ICD-10-CM

## 2021-10-21 DIAGNOSIS — E782 Mixed hyperlipidemia: Secondary | ICD-10-CM

## 2021-10-21 DIAGNOSIS — G4733 Obstructive sleep apnea (adult) (pediatric): Secondary | ICD-10-CM

## 2021-10-21 DIAGNOSIS — I10 Essential (primary) hypertension: Secondary | ICD-10-CM

## 2021-10-21 DIAGNOSIS — I471 Supraventricular tachycardia: Secondary | ICD-10-CM | POA: Diagnosis not present

## 2021-10-21 NOTE — Patient Instructions (Signed)

## 2021-10-21 NOTE — Progress Notes (Unsigned)
Cardiology Office Note:    Date:  10/22/2021   ID:  Ellen Shaw, DOB 1980/05/31, MRN [id number]  PCP:  Marlyn Corporal, PA  Cardiologist:  Thomasene Ripple, DO  Electrophysiologist:  None   Referring MD: Marlyn Corporal, PA   " I am doing well"  History of Present Illness:    Ellen Shaw is a 41 y.o. female with a hx of chronic respiratory failure now on oxygen managed by her primary care doctor, hypertension, morbid obesity, nonsustained ventricular tachycardia on the monitor, recurrent palpitations here today for follow-up visit.   I saw the patient 1 March 20, 2019 at that time she had some chest pain and based on her I did have some multiple episodes of nonsustained ventricular tachycardia I recommended she undergo a coronary CTA to rule out coronary artery disease.   I saw the patient on April 12, 2019 we discussed her CT results which showed 0 calcium with no evidence of coronary artery disease.  She did tell me that her palpitations were not being helped by the Cardizem.  We stopped the Cardizem and start the patient on propanolol 20 mg at nighttime and flecainide 50 mg twice a day.     I saw the patient on October 15, 2020 at that time she had she started oxygen.  She was doing well with this.  During that time we restarted her propanolol 10 mg daily due to low blood pressure and we kept her on the flecainide twice a day.   I saw the patient on January 07, 2021 at that time she was doing well she has some leg swelling and her palpitations have improved significantly.   In the interim the patient was doing well.  But has been following with the cardio Valorie Roosevelt who has been treating her for possible bulging disks.  The chiropractor recommended that she go to the emergency department initially on February 22, 2021 at which time she was experiencing left upper back pain spasm as well as left arm weakness.  At that time she described her muscle spasms that he was on and off which started  back in November.  During that visit all of her work-up was negative she had a lumbar spine x-ray which showed evidence of moderate this changes at L5-S1 as well as L4-L5.  There was also evidence of chronic lumbar hyperlordosis.  At that time she also had a shoulder x-ray which showed all of her joint spaces were maintained.   At her visit on February 28, 2021 she was status post hospitalization where she was noted to be in SVT.  At that visit she did have some palpitations.  She was having some pleuritic chest pain as sent the patient for CTA to rule out pulmonary embolism.  She did get the imaging there is no evidence of pulmonary embolism.  I saw the patient on July 04, 2021 at that time she was status post a hospitalization in May for [facility].  At her last visit I increased her Lasix to 40 mg every other day because she was asked with some leg swelling.  Today she is here for follow-up visit.  She is doing well she tells me.  She has not had any hospitalizations since I last saw the patient.  I am very happy for her.  Past Medical History:  Diagnosis Date   Alcohol abuse 02/21/2014   Allergic rhinitis 01/31/2008   Qualifier: Diagnosis of  By: Truman Hayward Duncan Dull), Lynnae Sandhoff  Formatting of this note might be different from the original. Overview:  Qualifier: Diagnosis of  By: Truman Hayward Duncan Dull), Susanne   Anxiety 07/22/2016   Arthritis 07/22/2016   Formatting of this note might be different from the original. Knees Formatting of this note might be different from the original. Overview:  Knees   Asthma 01/31/2008   Qualifier: Diagnosis of  By: Truman Hayward (Duncan Dull), Epifanio Lesches of this note might be different from the original. Overview:  Qualifier: Diagnosis of  By: Truman Hayward Duncan Dull), Lynnae Sandhoff   ASTHMA 01/31/2008   Qualifier: Diagnosis of  By: Truman Hayward Duncan Dull), Susanne     Bell's palsy    age 25-14   Bilateral leg edema 02/15/2020   Body mass index (BMI) of 50.0 to 59.9 in adult [facility])  01/06/2017   Chest discomfort 03/19/2020   Complex atypical endometrial hyperplasia 08/01/2013   Constipation 06/28/2018   DYSPNEA 01/31/2008   Qualifier: Diagnosis of  By: Vassie Loll MD, Comer Locket.     Episodic mood disorder (HCC) 02/04/2012   Essential hypertension 02/03/2011   Folliculitis 10/12/2017   GERD (gastroesophageal reflux disease) 12/03/2012   History of Helicobacter pylori infection 03/03/2017   Hx of migraines 02/13/2020   Hyperlipidemia    Hypertension    Intertrigo 09/01/2017   Lumbosacral radiculopathy 09/01/2013   Major depressive disorder, recurrent episode (HCC) 05/02/2014   Medication overuse headache 07/27/2014   Migraine with aura and without status migrainosus, not intractable 07/27/2014   Formatting of this note might be different from the original. Follows with Dr. Weston Settle in Neurology. HX pseudotumor cerebri   Mixed stress and urge urinary incontinence 08/12/2013   Morbid obesity (HCC) 02/15/2020   Multiple allergies 02/13/2020   NSVT (nonsustained ventricular tachycardia) (HCC) 03/19/2020   Obstructive sleep apnea on CPAP 12/27/2010   Formatting of this note might be different from the original. Uses Cpap  Formatting of this note might be different from the original. AHI=21.8 AutoPAP at 15-20 with an EPR=3  Last Assessment & Plan:  Formatting of this note might be different from the original. 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 Relevant Hx: Course: Daily Update: Today's Plan:  Electronically signed by: Seward Speck, MD Resident 12/22/14 [phone]   Prediabetes 06/30/2017   Formatting of this note might be different from the original. A1C 6.1  on 06/2017   Primary hypertension 02/15/2020   Primary osteoarthritis of right knee 10/12/2012   Pseudotumor    PVC (premature ventricular contraction) 03/19/2020   Right bundle branch block 02/15/2020   Right cervical radiculopathy 07/18/2014   Sleep apnea    SVT (supraventricular tachycardia) (HCC) 07/15/2016   Tobacco use 12/03/2011   Umbilical cyst 06/02/2011   Urinary retention 08/19/2013   Formatting of this note might be different from the original. S/P interstim placement with Urology   Vulvovaginal candidiasis 03/03/2017    Past Surgical History:  Procedure Laterality Date   BLADDER SURGERY     TONSILLECTOMY AND ADENOIDECTOMY     VAGINAL HYSTERECTOMY  2015    Current Medications: Current Meds  Medication Sig   Cholecalciferol (VITAMIN D3) 1.25 MG (50000 UT) CAPS Take 1 capsule by mouth once a week.  EMGALITY 120 MG/ML SOAJ Inject 1 mL into the skin every 30 (thirty) days.   EPINEPHrine 0.3 mg/0.3 mL IJ SOAJ injection Inject into the muscle as needed for anaphylaxis.   FARXIGA 5 MG TABS tablet Take 5 mg by mouth every morning.   FEROSUL 325 (65 Fe) MG tablet Take 325 mg by mouth daily.   flecainide (TAMBOCOR) 150 MG tablet Take 1 tablet (150 mg total) by mouth 2 (two) times daily.   furosemide (LASIX) 40 MG tablet Take 1 tablet (40 mg total) by mouth every other day.   linaclotide (LINZESS) 290 MCG CAPS capsule Take 290 mcg by mouth daily before breakfast.   loratadine (CLARITIN) 10 MG tablet Take 10 mg by mouth daily.   Multiple Vitamin (MULTI-VITAMIN) tablet Take 1 tablet by mouth daily.   potassium chloride SA (KLOR-CON M20) 20 MEQ tablet Take 1 tablet (20 mEq total) by mouth every other day.   promethazine (PHENERGAN) 12.5 MG tablet Take 12.5 mg by mouth every 6 (six) hours as needed for nausea or vomiting.   risperiDONE (RISPERDAL) 2 MG tablet Take 1.5 tablets by mouth at bedtime.   Sertraline HCl 150 MG CAPS Take 150 mg by mouth daily.   traZODone (DESYREL) 50 MG tablet Take  by mouth at bedtime as needed for sleep.   Ubrogepant (UBRELVY) 100 MG TABS Take 100 mg by mouth as needed (migraines).   vitamin B-12 (CYANOCOBALAMIN) 1000 MCG tablet Take 1,000 mcg by mouth daily.     Allergies:   Ciprofloxacin, Clindamycin, Esomeprazole magnesium, Nitrofurantoin, Oxycodone, Sulfa antibiotics, Sulfamethoxazole-trimethoprim, Trimethoprim, Bee pollen, Citrus, Doxycycline, Fluticasone, Penicillins, Pollen extract, Bee venom, Buspirone, Esomeprazole magnesium, Fentanyl, Flonase [fluticasone propionate], Fluticasone propionate, Lexapro [escitalopram], Paliperidone, Pantoprazole, and Orange oil   Social History   Socioeconomic History   Marital status: Single    Spouse name: Not on file   Number of children: Not on file   Years of education: Not on file   Highest education level: Not on file  Occupational History   Not on file  Tobacco Use   Smoking status: Former    Types: Cigarettes    Quit date: 01/19/2020    Years since quitting: 1.7   Smokeless tobacco: Never  Substance and Sexual Activity   Alcohol use: Not Currently   Drug use: No   Sexual activity: Not on file  Other Topics Concern   Not on file  Social History Narrative   Not on file   Social Determinants of Health   Financial Resource Strain: Not on file  Food Insecurity: Not on file  Transportation Needs: Not on file  Physical Activity: Not on file  Stress: Not on file  Social Connections: Not on file     Family History: The patient's family history includes Atrial fibrillation in her father; COPD in her father; Congestive Heart Failure in her father; Glaucoma in her father; Heart attack in her maternal grandmother; High Cholesterol in her mother; Hypertension in her father; Kidney disease in her father; Lung cancer in her father and paternal grandmother; Macular degeneration in her father; Stroke in her father.  ROS:   Review of Systems  Constitution: Negative for decreased appetite, fever and  weight gain.  HENT: Negative for congestion, ear discharge, hoarse voice and sore throat.   Eyes: Negative for discharge, redness, vision loss in right eye and visual halos.  Cardiovascular: Negative for chest pain, dyspnea on exertion, leg swelling, orthopnea and palpitations.  Respiratory: Negative for cough, hemoptysis, shortness of breath and  snoring.   Endocrine: Negative for heat intolerance and polyphagia.  Hematologic/Lymphatic: Negative for bleeding problem. Does not bruise/bleed easily.  Skin: Negative for flushing, nail changes, rash and suspicious lesions.  Musculoskeletal: Negative for arthritis, joint pain, muscle cramps, myalgias, neck pain and stiffness.  Gastrointestinal: Negative for abdominal pain, bowel incontinence, diarrhea and excessive appetite.  Genitourinary: Negative for decreased libido, genital sores and incomplete emptying.  Neurological: Negative for brief paralysis, focal weakness, headaches and loss of balance.  Psychiatric/Behavioral: Negative for altered mental status, depression and suicidal ideas.  Allergic/Immunologic: Negative for HIV exposure and persistent infections.    EKGs/Labs/Other Studies Reviewed:    The following studies were reviewed today:   EKG:  None today   CCTA  Aorta: Normal size.  No calcifications.  No dissection.   Aortic Valve:  Trileaflet.  No calcifications.   Coronary Arteries:  Normal coronary origin.  Right dominance.   RCA is a large dominant artery that gives rise to PDA and PLVB. There is no plaque.   Left main is a large artery that gives rise to LAD and LCX arteries.   LAD is a large vessel that has no plaque.   LCX is a non-dominant artery that gives rise to one large OM1 branch. There is no plaque.   Other findings:   Normal pulmonary vein drainage into the left atrium.   Normal left atrial appendage without a thrombus.   Normal size of the pulmonary artery.   IMPRESSION: 1. Coronary calcium score  of 0. This was 0 percentile for age and sex matched control.   2. Normal coronary origin with right dominance.   3. No evidence of CAD.     Zio monitor The patient wore the monitor for 14 days starting February 15, 2020   . Indication: Palpitations   The minimum heart rate was 47 bpm, maximum heart rate was 240 bpm, and average heart rate was 85 bpm. Predominant underlying rhythm was Sinus Rhythm.  First-degree AV block was present.   9 Ventricular Tachycardia runs occurred, the run with the fastest interval lasting 5 beats with a maximum rate of 240 bpm, the longest lasting 5 beats with an average rate of 184 bpm.   Premature atrial complexes were rare less than 1%. Premature Ventricular complexes rare less than 1%.   No pauses, No AV block and no atrial fibrillation present. 14 patient triggered events and 13 diary events associated with premature ventricular complex.   Conclusion: This study is remarkable for the following:                             1.  Nonsustained ventricular tachycardia                             2.  Rare symptomatic premature ventricular complex.   Transthoracic Echocardiogram IMPRESSIONS  1. Left ventricular ejection fraction, by estimation, is 60 to 65%. The left ventricle has normal function. The left ventricle has no regional wall motion abnormalities. Left ventricular diastolic parameters were normal.   2. Right ventricular systolic function is normal. The right ventricular size is normal.   3. The mitral valve is normal in structure. No evidence of mitral valve regurgitation. No evidence of mitral stenosis.   4. The aortic valve is normal in structure. Aortic valve regurgitation is not visualized. No aortic stenosis is present.   5. The inferior vena  cava is normal in size with greater than 50% respiratory variability, suggesting right atrial pressure of 3 mmHg.   FINDINGS   Left Ventricle: Left ventricular ejection fraction, by estimation, is 60 to  65%. The left ventricle has normal function. The left ventricle has no regional wall motion abnormalities. Definity contrast agent was given IV to delineate the left ventricular   endocardial borders. The left ventricular internal cavity size was normal in size. There is no left ventricular hypertrophy. Left ventricular diastolic parameters were normal.   Right Ventricle: The right ventricular size is normal. No increase in right ventricular wall thickness. Right ventricular systolic function is  normal.   Left Atrium: Left atrial size was normal in size.   Right Atrium: Right atrial size was normal in size.   Pericardium: There is no evidence of pericardial effusion.   Mitral Valve: The mitral valve is normal in structure. No evidence of  mitral valve regurgitation. No evidence of mitral valve stenosis.   Tricuspid Valve: The tricuspid valve is normal in structure. Tricuspid  valve regurgitation is trivial. No evidence of tricuspid stenosis.   Aortic Valve: The aortic valve is normal in structure. Aortic valve  regurgitation is not visualized. No aortic stenosis is present.   Pulmonic Valve: The pulmonic valve was normal in structure. Pulmonic valve  regurgitation is not visualized. No evidence of pulmonic stenosis.   Aorta: The aortic root is normal in size and structure.   Venous: The inferior vena cava is normal in size with greater than 50%  respiratory variability, suggesting right atrial pressure of 3 mmHg.   IAS/Shunts: No atrial level shunt detected by color flow Doppler.     ZIO monitor 2022 Patch Wear Time:  13 days and 14 hours starting November 12, 2020. Indication palpitation   Patient had a min HR of 51 bpm, max HR of 158 bpm, and avg HR of 78 bpm. Predominant underlying rhythm was Sinus Rhythm. First Degree AV Block was present.    8 Supraventricular Tachycardia runs occurred, the run with the fastest interval lasting 5 beats with a max rate of 158 bpm (avg 128  bpm); the run with the fastest interval was  also the longest. Isolated SVEs were rare (<1.0%), SVE Couplets were rare (<1.0%), and SVE Triplets were rare (<1.0%). Isolated VEs were rare (<1.0%), VE Couplets were rare (<1.0%), and no VE Triplets were present. Ventricular Bigeminy and Trigeminy  were present.   Symptoms were associated with sinus rhythm and PVC.   No atrial fibrillation.   Conclusion: This study is remarkable for paroxysmal supraventricular tachycardia    Recent Labs: 07/04/2021: BUN 11; Creatinine, Ser 0.58; Magnesium 1.9; Potassium 3.8; Sodium 139  Recent Lipid Panel No results found for: "CHOL", "TRIG", "HDL", "CHOLHDL", "VLDL", "LDLCALC", "LDLDIRECT"  Physical Exam:    VS:  BP 112/74   Pulse 77   Ht 5\' 9"  (1.753 m)   Wt (!) 332 lb 12.8 oz (151 kg)   SpO2 96%   BMI 49.15 kg/m     Wt Readings from Last 3 Encounters:  10/21/21 (!) 332 lb 12.8 oz (151 kg)  08/21/21 (!) 351 lb 4.8 oz (159.3 kg)  07/09/21 (!) 352 lb 9.6 oz (159.9 kg)     GEN: Well nourished, well developed in no acute distress HEENT: Normal NECK: No JVD; No carotid bruits LYMPHATICS: No lymphadenopathy CARDIAC: S1S2 noted,RRR, no murmurs, rubs, gallops RESPIRATORY:  Clear to auscultation without rales, wheezing or rhonchi  ABDOMEN: Soft, non-tender, non-distended, +bowel  sounds, no guarding. EXTREMITIES: No edema, No cyanosis, no clubbing MUSCULOSKELETAL:  No deformity  SKIN: Warm and dry NEUROLOGIC:  Alert and oriented x 3, non-focal PSYCHIATRIC:  Normal affect, good insight  ASSESSMENT:    1. Essential hypertension   2. SVT (supraventricular tachycardia) (HCC)   3. Primary hypertension   4. NSVT (nonsustained ventricular tachycardia) (HCC)   5. Obstructive sleep apnea on CPAP   6. Morbid obesity (HCC)   7. Mixed hyperlipidemia    PLAN:    She is doing well from a CV standpoint.  Will continue her current medication regimen. The patient understands the need to lose weight with  diet and exercise. We have discussed specific strategies for this. Continued CPAP use.  Blood pressure is acceptable, continue with current antihypertensive regimen.  The patient is in agreement with the above plan. The patient left the office in stable condition.  The patient will follow up in 6 months   Medication Adjustments/Labs and Tests Ordered: Current medicines are reviewed at length with the patient today.  Concerns regarding medicines are outlined above.  No orders of the defined types were placed in this encounter.  No orders of the defined types were placed in this encounter.   Patient Instructions  Medication Instructions:  Your physician recommends that you continue on your current medications as directed. Please refer to the Current Medication list given to you today.  *If you need a refill on your cardiac medications before your next appointment, please call your pharmacy*   Lab Work: None If you have labs (blood work) drawn today and your tests are completely normal, you will receive your results only by: MyChart Message (if you have MyChart) OR A paper copy in the mail If you have any lab test that is abnormal or we need to change your treatment, we will call you to review the results.   Testing/Procedures: None   Follow-Up: At [facility], you and your health needs are our priority.  As part of our continuing mission to provide you with exceptional heart care, we have created designated Provider Care Teams.  These Care Teams include your primary Cardiologist (physician) and Advanced Practice Providers (APPs -  Physician Assistants and Nurse Practitioners) who all work together to provide you with the care you need, when you need it.  We recommend signing up for the patient portal called "MyChart".  Sign up information is provided on this After Visit Summary.  MyChart is used to connect with patients for Virtual Visits (Telemedicine).  Patients are able to  view lab/test results, encounter notes, upcoming appointments, etc.  Non-urgent messages can be sent to your provider as well.   To learn more about what you can do with MyChart, go to [email or web address].    Your next appointment:   6 month(s)  The format for your next appointment:   In Person  Provider:   Thomasene Ripple, DO     Other Instructions   Important Information About Sugar         Adopting a Healthy Lifestyle.  Know what a healthy weight is for you (roughly BMI <25) and aim to maintain this   Aim for 7+ servings of fruits and vegetables daily   65-80+ fluid ounces of water or unsweet tea for healthy kidneys   Limit to max 1 drink of alcohol per day; avoid smoking/tobacco   Limit animal fats in diet for cholesterol and heart health - choose grass fed whenever available   Avoid highly  processed foods, and foods high in saturated/trans fats   Aim for low stress - take time to unwind and care for your mental health   Aim for 150 min of moderate intensity exercise weekly for heart health, and weights twice weekly for bone health   Aim for 7-9 hours of sleep daily   When it comes to diets, agreement about the perfect plan isnt easy to find, even among the experts. Experts at the [facility] [employer] developed an idea known as the Healthy Eating Plate. Just imagine a plate divided into logical, healthy portions.   The emphasis is on diet quality:   Load up on vegetables and fruits - one-half of your plate: Aim for color and variety, and remember that potatoes dont count.   Go for whole grains - one-quarter of your plate: Whole wheat, barley, wheat berries, quinoa, oats, brown rice, and foods made with them. If you want pasta, go with whole wheat pasta.   Protein power - one-quarter of your plate: Fish, chicken, beans, and nuts are all healthy, versatile protein sources. Limit red meat.   The diet, however, does go beyond the plate, offering a  few other suggestions.   Use healthy plant oils, such as olive, canola, soy, corn, sunflower and peanut. Check the labels, and avoid partially hydrogenated oil, which have unhealthy trans fats.   If youre thirsty, drink water. Coffee and tea are good in moderation, but skip sugary drinks and limit milk and dairy products to one or two daily servings.   The type of carbohydrate in the diet is more important than the amount. Some sources of carbohydrates, such as vegetables, fruits, whole grains, and beans-are healthier than others.   Finally, stay active  Signed, Thomasene Ripple, DO  10/22/2021 10:41 AM    [facility]

## 2021-10-22 ENCOUNTER — Encounter: Payer: Medicare Other | Attending: Cardiology | Admitting: Skilled Nursing Facility1

## 2021-10-22 DIAGNOSIS — E119 Type 2 diabetes mellitus without complications: Secondary | ICD-10-CM | POA: Insufficient documentation

## 2021-10-22 NOTE — Progress Notes (Signed)
Medical Nutrition Therapy    Primary concerns today: Weight loss, glycemic control.  Referral diagnosis: E66.01 - Morbid Obesity Preferred learning style: No preference indicated Learning readiness: Ready   NUTRITION ASSESSMENT   Anthropometrics  Ht: 5'9" Wt: 334 lbs    Clinical Medical Hx: HTN, HLD, GERD, diabetes, Asthma Medications: See list Labs: A1c - 6.5, LDL - 109, triglycerides 164 Notable Signs/Symptoms: N/A   Lifestyle & Dietary Hx  Pt arrives having lost 17 pounds from the tweaks she has made.   Pt states her A1C was retested and it is still 6.5. Pt state she checks her blood sugars 2-3 times a day: fasting but having drank black coffee: 98-125 before dinner: 125-145  (Previous appt) Pt states she is an emotional eater and when experiencing a stressful day will eat a bunch of stuff and then when feeling depressed will not eat. Pt states the derepression is more under control now since an increase in her medication which keeps her from having too bad of days.   Pt states her IBS-C is pretty controlled with no issues as long as she takes her Linzess.   Pt states she does do therapy 1-2 times a week. Pt states her binging is doing better not doing as often due to working on coping skills and learning to pay attention more trying to identify before she conducts the behavior.   Pt states she does not know how to cook. Pt states lives alone but lives close to her mom.   Pt states she does like green beans and other beans and broccoli and onion and cucumbers and peppers    Pt states she has been using the mindful meals sheet and finds it really helpful and wants more copies. Pt states when she sees it is not hungry she will figure out what she is feeling and why she is feeling it and find something else to do to help with the feeling such as moving past the feeling or coloring or working out.  Pt states she is trying to find her routine with going to the gym with the goal  of going 5 days a week. Pt states she has a reward of using the massage chair after her workout and cannot use it unless she has worked out.   Pt states she started working out which she is proud of.   Pt states she has been trying to make healthier food choices trying to eat more vegetables. Pt states she did try peppers and onions in her eggs which she liked and has one doing one vegetable every day.  Pt states she is not a big fan of meat.  Pt states her mom is really proud of her and wants to walk with her.    Food allergies: Oranges (Breaks out in a rash), pickles and tomatoes (severe GI pain due to seeds)  Estimated daily fluid intake: 50 oz Supplements: Daily multivitamin, Vitamin D (50,000 IU), B-12 1000 mcg Sleep: OSA, uses CPAP Stress / self-care: Usually 6/10, family troubles caring for mother Current average weekly physical activity: [employer] 4-5 days a week walking and some weight machines    24-Hr Dietary Recall First Meal: granola bar Snack: egg + peppers and onions or pack of peanut butter crackers Second Meal: tomato sandwich + apple Snack: Third Meal: green beans, potatoes and trying different vegetables Snack: none Beverages: coffee, water   NUTRITION DIAGNOSIS  NB-1.1 Food and nutrition-related knowledge deficit As related to obesity.  As  evidenced by BMI of 51.88 kg/m2, limited physical activity, and dinner meals high in starches..   NUTRITION INTERVENTION  Nutrition education (E-1) on the following topics: Continued  Educated patient on the balanced plate eating model. Recommended lunch and dinner be 1/2 non-starchy vegetables, 1/4 starches, and 1/4 protein. Recommended breakfast be a balance of starch and protein with a piece of fruit. Discussed with patient the importance of working towards hitting the proportions of the balanced plate consistently.  Educated patient on factors that contribute to elevation of blood sugars, such as stress, illness,  injury,and food choices. Discussed the role that physical activity plays in lowering blood sugar. Educate patient on the three main macronutrients. Protein, fats, and carbohydrates. Discussed how each of these macronutrients affect blood sugar levels, especially carbohydrate, and the importance of eating a consistent amount of carbohydrate throughout the day.    Handouts Previously Provided Include  Balanced Plate Balanced Plate Food List Mindful meals exercise Should I eat sheet  Learning Style & Readiness for Change Teaching method utilized: Visual & Auditory  Demonstrated degree of understanding via: Teach Back  Barriers to learning/adherence to lifestyle change: Limited capacity for physical activity  Goals Established by Pt Try switching to 2% milk from whole milk. Start your day with a Glucerna for breakfast. Continue to be more physically active as your health is improving.  Start by going to the gym one day next week. Walk your dog as much as possible. Set your step goal to 6,000 - 7,000 a day. Continue: Look for "Steam In Bag" frozen vegetables in the freezer aisle. Keep a stash in your freezer! Continue: Congratulations on quitting smoking!! Talk with your therapist about using sweets to replace cravings for cigarettes. Think about what things drive you to have these cravings, and what type of satisfaction they actually provide. continue: ask your mom to go teach you how to cook the recipes provided  continue: eat one of the vegetables you like daily continue: try peppers and onions in eggs for breakfast NEW: add protein with dinner try a plant based frozen option NEW: try greek yogurt    MONITORING & EVALUATION Dietary intake, weekly physical activity, and weight   Next Steps  Patient is to follow with [facility] 2 months

## 2021-11-20 ENCOUNTER — Ambulatory Visit: Payer: Self-pay | Admitting: General Surgery

## 2021-11-25 DIAGNOSIS — K811 Chronic cholecystitis: Secondary | ICD-10-CM | POA: Insufficient documentation

## 2021-11-25 DIAGNOSIS — K429 Umbilical hernia without obstruction or gangrene: Secondary | ICD-10-CM | POA: Insufficient documentation

## 2021-11-25 DIAGNOSIS — K828 Other specified diseases of gallbladder: Secondary | ICD-10-CM | POA: Insufficient documentation

## 2021-11-25 DIAGNOSIS — R1011 Right upper quadrant pain: Secondary | ICD-10-CM | POA: Insufficient documentation

## 2021-12-11 ENCOUNTER — Encounter: Payer: Self-pay | Admitting: Cardiology

## 2021-12-12 ENCOUNTER — Ambulatory Visit: Payer: Self-pay | Admitting: General Surgery

## 2021-12-18 ENCOUNTER — Ambulatory Visit: Payer: Medicare Other | Attending: Physician Assistant | Admitting: Physician Assistant

## 2021-12-19 NOTE — Progress Notes (Signed)
Patient no-showed to today's visit  This encounter was created in error - please disregard.

## 2021-12-25 NOTE — Patient Instructions (Addendum)
SURGICAL WAITING ROOM VISITATION Patients having surgery or a procedure may have no more than 2 support people in the waiting area - these visitors may rotate.   Children under the age of 54 must have an adult with them who is not the patient. If the patient needs to stay at the hospital during part of their recovery, the visitor guidelines for inpatient rooms apply. Pre-op nurse will coordinate an appropriate time for 1 support person to accompany patient in pre-op.  This support person may not rotate.    Please refer to the [facility] website for the visitor guidelines for Inpatients (after your surgery is over and you are in a regular room).     Your procedure is scheduled on: 01/09/22   Report to [facility] Main Entrance    Report to admitting at 8:15 AM   Call this number if you have problems the morning of surgery [phone]   Do not eat food :After Midnight.   After Midnight you may have the following liquids until 7:30 AM DAY OF SURGERY  Water Non-Citrus Juices (without pulp, NO RED) Carbonated Beverages Black Coffee (NO MILK/CREAM OR CREAMERS, sugar ok)  Clear Tea (NO MILK/CREAM OR CREAMERS, sugar ok) regular and decaf                             Plain Jell-O (NO RED)                                           Fruit ices (not with fruit pulp, NO RED)                                     Popsicles (NO RED)                                                               Sports drinks like Gatorade (NO RED)                     If you have questions, please contact your surgeon's office.   FOLLOW BOWEL PREP AND ANY ADDITIONAL PRE OP INSTRUCTIONS YOU RECEIVED FROM YOUR SURGEON'S OFFICE!!!     Oral Hygiene is also important to reduce your risk of infection.                                    Remember - BRUSH YOUR TEETH THE MORNING OF SURGERY WITH YOUR REGULAR TOOTHPASTE   Do NOT smoke after Midnight   Take these medicines the morning of surgery with A SIP OF WATER:  Tambocor, Claritin, Zoloft   DO NOT TAKE ANY ORAL DIABETIC MEDICATIONS DAY OF YOUR SURGERY  How to Manage Your Diabetes Before and After Surgery  Why is it important to control my blood sugar before and after surgery? Improving blood sugar levels before and after surgery helps healing and can limit problems. A way of improving blood sugar control is eating a healthy diet by:  Eating less sugar and carbohydrates  Increasing activity/exercise  Talking with your doctor about reaching your blood sugar goals High blood sugars (greater than 180 mg/dL) can raise your risk of infections and slow your recovery, so you will need to focus on controlling your diabetes during the weeks before surgery. Make sure that the doctor who takes care of your diabetes knows about your planned surgery including the date and location.  How do I manage my blood sugar before surgery? Check your blood sugar at least 4 times a day, starting 2 days before surgery, to make sure that the level is not too high or low. Check your blood sugar the morning of your surgery when you wake up and every 2 hours until you get to the Short Stay unit. If your blood sugar is less than 70 mg/dL, you will need to treat for low blood sugar: Do not take insulin. Treat a low blood sugar (less than 70 mg/dL) with  cup of clear juice (cranberry or apple), 4 glucose tablets, OR glucose gel. Recheck blood sugar in 15 minutes after treatment (to make sure it is greater than 70 mg/dL). If your blood sugar is not greater than 70 mg/dL on recheck, call [phone] for further instructions. Report your blood sugar to the short stay nurse when you get to Short Stay.  If you are admitted to the hospital after surgery: Your blood sugar will be checked by the staff and you will probably be given insulin after surgery (instead of oral diabetes medicines) to make sure you have good blood sugar levels. The goal for blood sugar control after surgery is  80-180 mg/dL.   WHAT DO I DO ABOUT MY DIABETES MEDICATION?  Do not take oral diabetes medicines (pills) the morning of surgery.  Do not take Wilder Glade for 3 days     Reviewed and Endorsed by [facility] Patient Education Committee, August 2015   Bring CPAP mask and tubing day of surgery.                              You may not have any metal on your body including hair pins, jewelry, and body piercing             Do not wear make-up, lotions, powders, perfumes, or deodorant  Do not wear nail polish including gel and S&S, artificial/acrylic nails, or any other type of covering on natural nails including finger and toenails. If you have artificial nails, gel coating, etc. that needs to be removed by a nail salon please have this removed prior to surgery or surgery may need to be canceled/ delayed if the surgeon/ anesthesia feels like they are unable to be safely monitored.   Do not shave  48 hours prior to surgery.    Do not bring valuables to the hospital. [place].  DO NOT [place]. PHARMACY WILL DISPENSE MEDICATIONS LISTED ON YOUR MEDICATION LIST TO YOU DURING YOUR ADMISSION [place]!    Patients discharged on the day of surgery will not be allowed to drive home.  Someone NEEDS to stay with you for the first 24 hours after anesthesia.   Special Instructions: Bring a copy of your healthcare power of attorney and living will documents the day of surgery if you haven't scanned them  before.              Please read over the following fact sheets you were given: IF [place] [phone] Schneiders    If you received a COVID test during your pre-op visit  it is requested that you wear a mask when out in public, stay away from anyone that may not be feeling well and notify your surgeon if you develop symptoms. If you test positive for Covid or have  been in contact with anyone that has tested positive in the last 10 days please notify you surgeon.     [place] - Preparing for Surgery Before surgery, you can play an important role.  Because skin is not sterile, your skin needs to be as free of germs as possible.  You can reduce the number of germs on your skin by washing with CHG (chlorahexidine gluconate) soap before surgery.  CHG is an antiseptic cleaner which kills germs and bonds with the skin to continue killing germs even after washing. Please DO NOT use if you have an allergy to CHG or antibacterial soaps.  If your skin becomes reddened/irritated stop using the CHG and inform your nurse when you arrive at Short Stay. Do not shave (including legs and underarms) for at least 48 hours prior to the first CHG shower.  You may shave your face/neck.  Please follow these instructions carefully:  1.  Shower with CHG Soap the night before surgery and the  morning of surgery.  2.  If you choose to wash your hair, wash your hair first as usual with your normal  shampoo.  3.  After you shampoo, rinse your hair and body thoroughly to remove the shampoo.                             4.  Use CHG as you would any other liquid soap.  You can apply chg directly to the skin and wash.  Gently with a scrungie or clean washcloth.  5.  Apply the CHG Soap to your body ONLY FROM THE NECK DOWN.   Do   not use on face/ open                           Wound or open sores. Avoid contact with eyes, ears mouth and   genitals (private parts).                       Wash face,  Genitals (private parts) with your normal soap.             6.  Wash thoroughly, paying special attention to the area where your    surgery  will be performed.  7.  Thoroughly rinse your body with warm water from the neck down.  8.  DO NOT shower/wash with your normal soap after using and rinsing off the CHG Soap.                9.  Pat yourself dry with a clean towel.            10.  Wear clean  pajamas.            11.  Place clean sheets on your bed the night of your first shower and do not  sleep with pets. Day of Surgery : Do  not apply any lotions/deodorants the morning of surgery.  Please wear clean clothes to the hospital/surgery center.  FAILURE TO FOLLOW THESE INSTRUCTIONS MAY RESULT IN THE CANCELLATION OF YOUR SURGERY  PATIENT SIGNATURE_________________________________  NURSE SIGNATURE__________________________________  ________________________________________________________________________

## 2021-12-25 NOTE — Progress Notes (Addendum)
COVID Vaccine Completed: yes one, allergic reaction  Date of COVID positive in last 90 days: no  PCP - [place] [facility] Cardiologist - Berniece Salines, DO  Chest x-ray - CT 03/01/21 Epic EKG - 07/03/21 req Stress Test - pt believes 2019 ECHO - 03/13/20 Epic Cardiac Cath - n/a Pacemaker/ICD device last checked: n/a Spinal Cord Stimulator: bladder stimulator, pt will bring remote DOS  Bowel Prep - no  Sleep Study - yes CPAP -  every night  Fasting Blood Sugar - pre DM 80-111 Checks Blood Sugar 1 times a day  Blood Thinner Instructions: n/a Aspirin Instructions: Last Dose:  Activity level: Can go up a flight of stairs and perform activities of daily living without stopping and without symptoms of chest pain or shortness of breath.    Anesthesia review: palpitations, on O2 2L PRN and at hs, HTN, SVT, chest pain  Patient denies shortness of breath, fever, cough and chest pain at PAT appointment  Patient verbalized understanding of instructions that were given to them at the PAT appointment. Patient was also instructed that they will need to review over the PAT instructions again at home before surgery.

## 2021-12-26 ENCOUNTER — Encounter (HOSPITAL_COMMUNITY)
Admission: RE | Admit: 2021-12-26 | Discharge: 2021-12-26 | Disposition: A | Discharge status: Home / Self Care | Payer: Medicare Other | Source: Ambulatory Visit | Attending: General Surgery | Admitting: General Surgery

## 2021-12-26 ENCOUNTER — Encounter (HOSPITAL_COMMUNITY): Payer: Self-pay

## 2021-12-26 VITALS — BP 132/86 | HR 78 | Temp 98.1°F | Resp 16 | Ht 69.0 in | Wt 320.0 lb

## 2021-12-26 DIAGNOSIS — J45909 Unspecified asthma, uncomplicated: Secondary | ICD-10-CM | POA: Diagnosis not present

## 2021-12-26 DIAGNOSIS — I10 Essential (primary) hypertension: Secondary | ICD-10-CM | POA: Diagnosis not present

## 2021-12-26 DIAGNOSIS — Z87891 Personal history of nicotine dependence: Secondary | ICD-10-CM | POA: Insufficient documentation

## 2021-12-26 DIAGNOSIS — Z01818 Encounter for other preprocedural examination: Secondary | ICD-10-CM | POA: Diagnosis present

## 2021-12-26 DIAGNOSIS — R7303 Prediabetes: Secondary | ICD-10-CM | POA: Diagnosis not present

## 2021-12-26 DIAGNOSIS — K828 Other specified diseases of gallbladder: Secondary | ICD-10-CM | POA: Diagnosis not present

## 2021-12-26 HISTORY — DX: Anemia, unspecified: D64.9

## 2021-12-26 LAB — CBC
HCT: 41.5 % (ref 36.0–46.0)
Hemoglobin: 13.4 g/dL (ref 12.0–15.0)
MCH: 29.6 pg (ref 26.0–34.0)
MCHC: 32.3 g/dL (ref 30.0–36.0)
MCV: 91.8 fL (ref 80.0–100.0)
Platelets: 307 10*3/uL (ref 150–400)
RBC: 4.52 MIL/uL (ref 3.87–5.11)
RDW: 13.3 % (ref 11.5–15.5)
WBC: 13 10*3/uL — ABNORMAL HIGH (ref 4.0–10.5)
nRBC: 0 % (ref 0.0–0.2)

## 2021-12-26 LAB — GLUCOSE, CAPILLARY: Glucose-Capillary: 144 mg/dL — ABNORMAL HIGH (ref 70–99)

## 2021-12-26 LAB — HEMOGLOBIN A1C
Hgb A1c MFr Bld: 6.4 % — ABNORMAL HIGH (ref 4.8–5.6)
Mean Plasma Glucose: 136.98 mg/dL

## 2021-12-26 LAB — BASIC METABOLIC PANEL
Anion gap: 6 (ref 5–15)
BUN: 7 mg/dL (ref 6–20)
CO2: 28 mmol/L (ref 22–32)
Calcium: 8.8 mg/dL — ABNORMAL LOW (ref 8.9–10.3)
Chloride: 103 mmol/L (ref 98–111)
Creatinine, Ser: 0.52 mg/dL (ref 0.44–1.00)
GFR, Estimated: >60 mL/min (ref >60)
Glucose, Bld: 129 mg/dL — ABNORMAL HIGH (ref 70–99)
Potassium: 3.8 mmol/L (ref 3.5–5.1)
Sodium: 137 mmol/L (ref 135–145)

## 2021-12-26 IMAGING — CT CT CHEST HIGH RESOLUTION
2 of 7 series · 15 of 36 positions shown, 18 images · non-contrast
Comparison: Cardiac CT 04/09/2020.  CTA of the chest 12/30/2019.

CLINICAL DATA: 39-year-old female with history of shortness of
breath and respiratory failure. History of COVID infection in
November 2019 and July 2020.

EXAM:
CT CHEST WITHOUT CONTRAST
TECHNIQUE: Multidetector CT imaging of the chest was performed following the
standard protocol without intravenous contrast. High resolution
imaging of the lungs, as well as inspiratory and expiratory imaging,
was performed.

[Series 4: chest 2.00 br36 s3 cor soft · coronal · 0.70mm/px · 3 of 240 slices shown]
[im 48/240  lung]
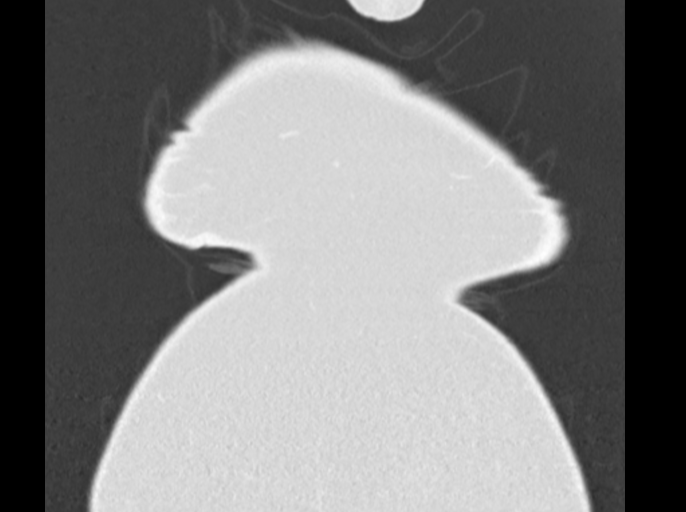
[im 96/240  lung]
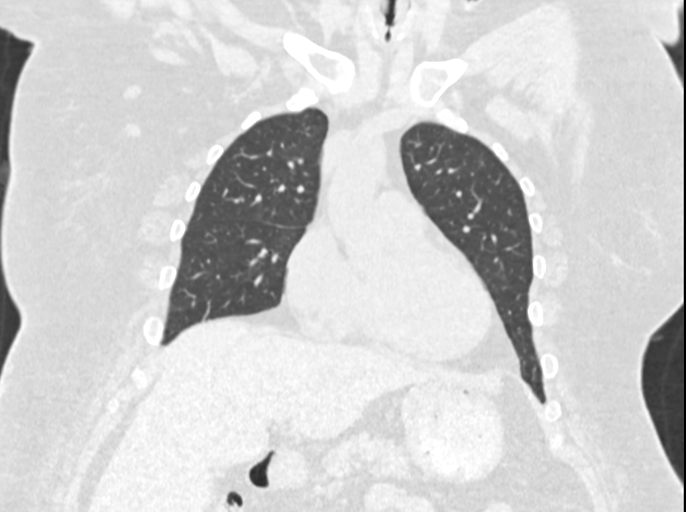
[im 144/240  lung]
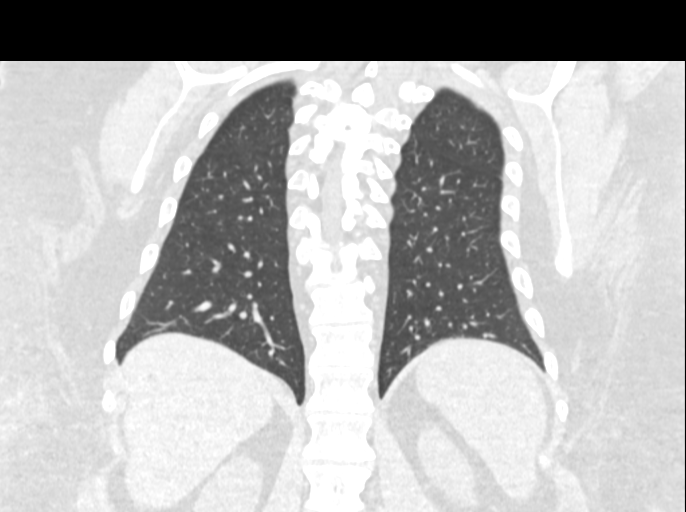

[Series 11: chest 1.00 br60 s3 high res thins 1x1 mm · axial · 0.94mm/px · z∈[+1509,+1812]mm · 12 of 359 slices shown, 15 images]
[im 28/359  mediastinal]
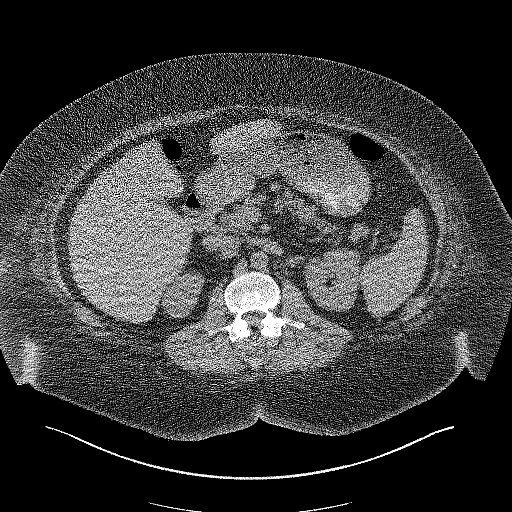
[im 28/359  lung]
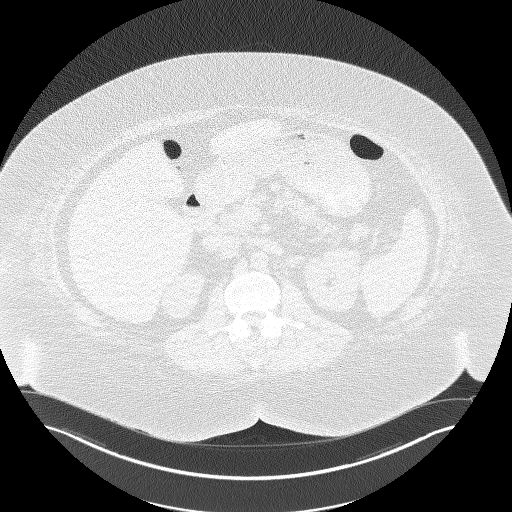
[im 56/359  lung]
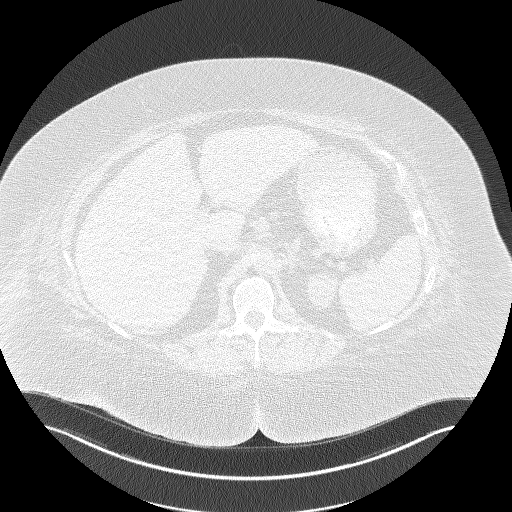
[im 83/359  lung]
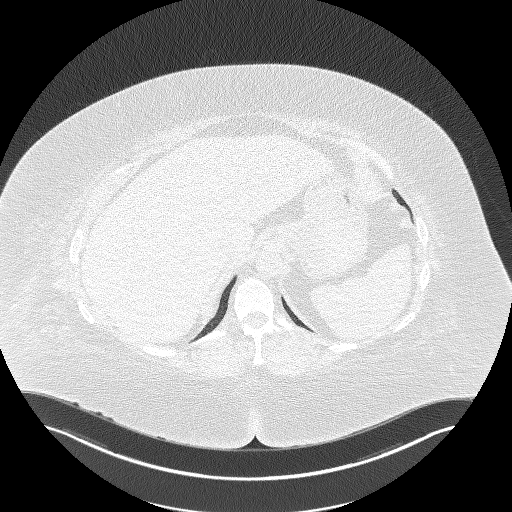
[im 111/359  lung]
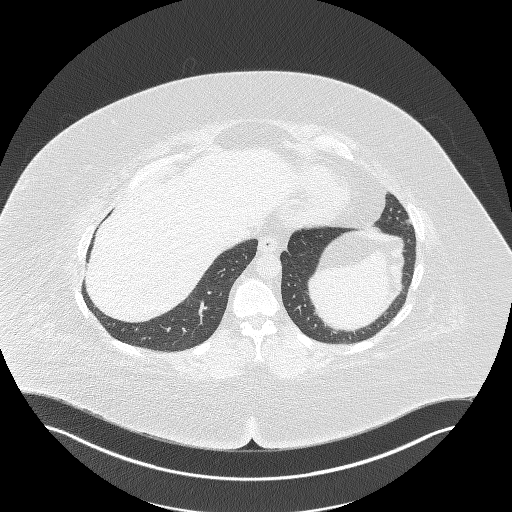
[im 138/359  mediastinal]
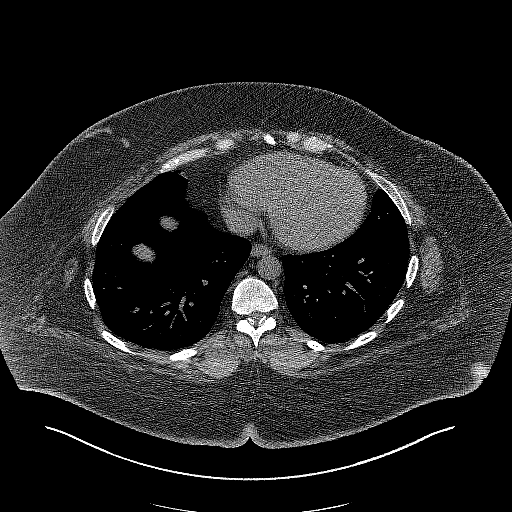
[im 138/359  lung]
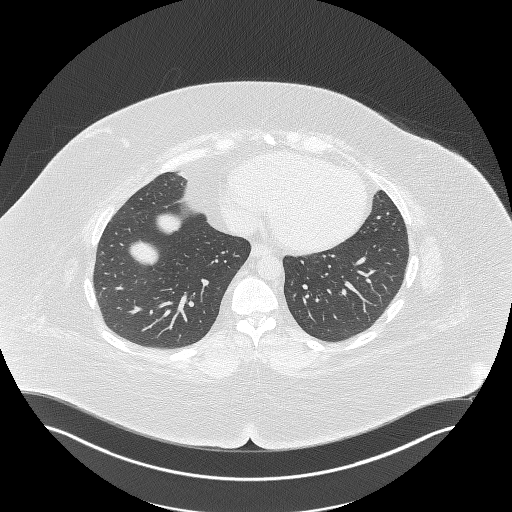
[im 166/359  lung]
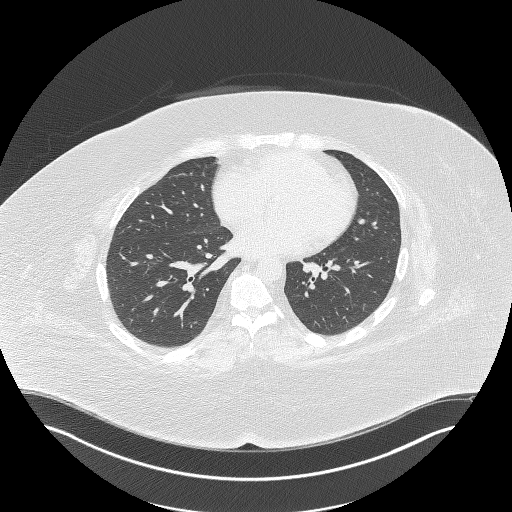
[im 193/359  lung]
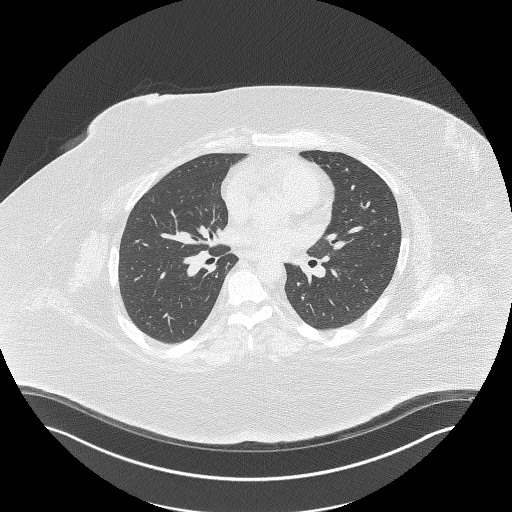
[im 221/359  lung]
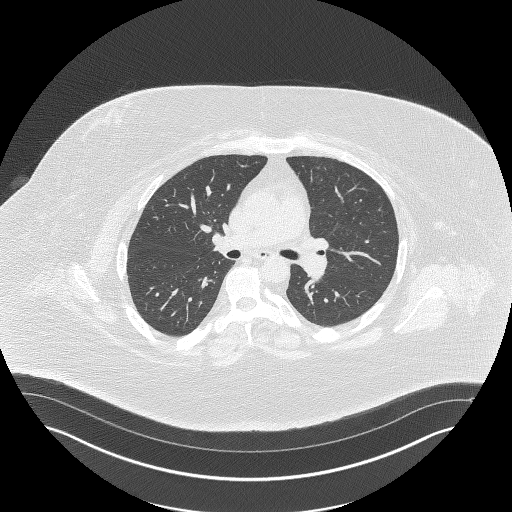
[im 248/359  mediastinal]
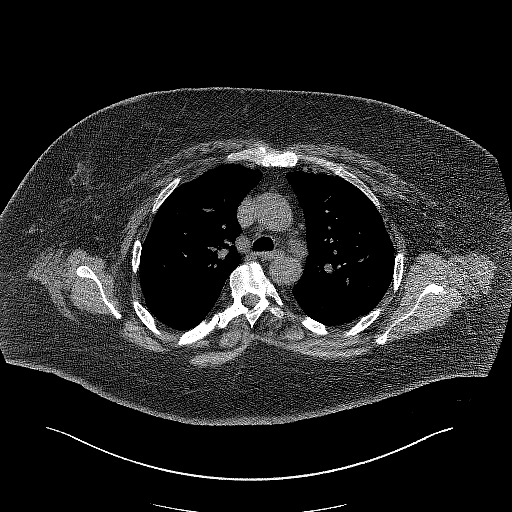
[im 248/359  lung]
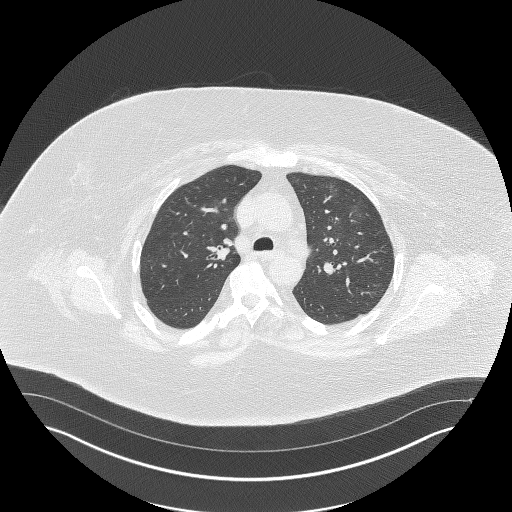
[im 276/359  lung]
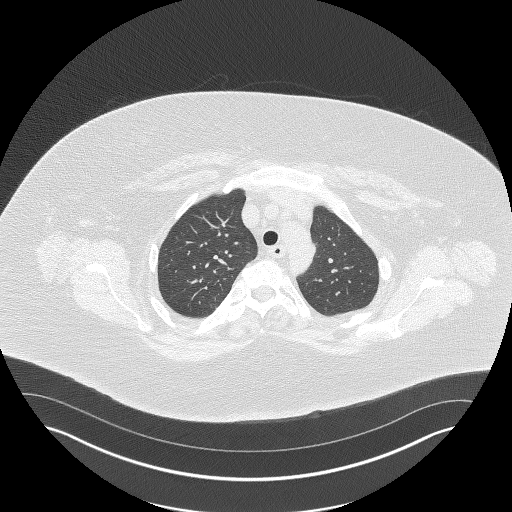
[im 303/359  lung]
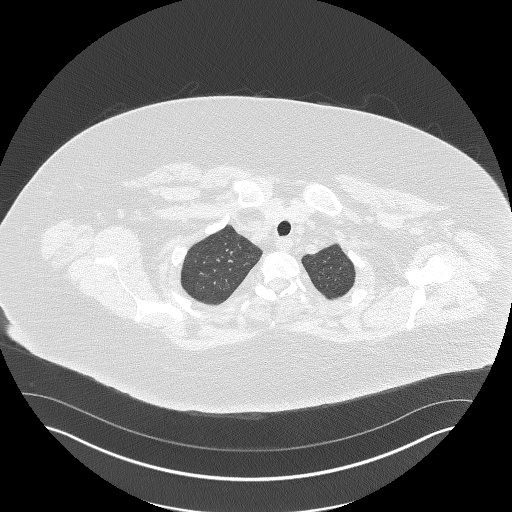
[im 331/359  lung]
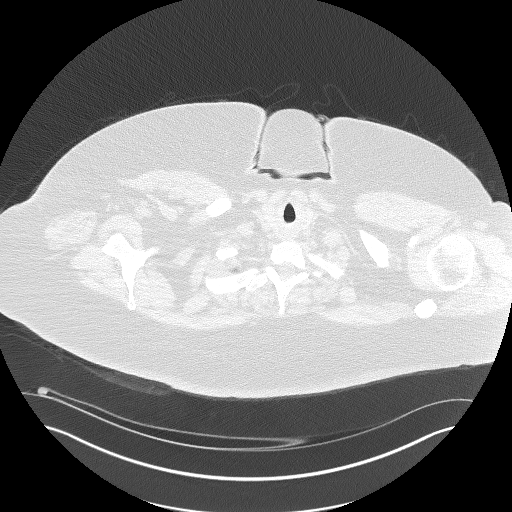

[15 of 36 positions shown; findings below may reference images not displayed]

FINDINGS: Cardiovascular: Heart size is normal. There is no significant
pericardial fluid, thickening or pericardial calcification. No
atherosclerotic calcifications are noted in the thoracic aorta or
the coronary arteries.

Mediastinum/Nodes: No pathologically enlarged mediastinal or hilar
lymph nodes. Please note that accurate exclusion of hilar adenopathy
is limited on noncontrast CT scans. Esophagus is unremarkable in
appearance. No axillary lymphadenopathy.

Lungs/Pleura: High-resolution images demonstrate no significant
regions of ground-glass attenuation, septal thickening, subpleural
reticulation, parenchymal banding, traction bronchiectasis or frank
honeycombing to indicate interstitial lung disease. Inspiratory and
expiratory imaging is unremarkable. No acute consolidative airspace
disease. No pleural effusions. No suspicious appearing pulmonary
nodules or masses are noted.

Upper Abdomen: Unremarkable.

Musculoskeletal: There are no aggressive appearing lytic or blastic
lesions noted in the visualized portions of the skeleton.
IMPRESSION: 1. No findings to suggest interstitial lung disease. No acute
findings in the thorax to account for the patient's symptoms.

## 2021-12-27 NOTE — Progress Notes (Signed)
Anesthesia Chart Review   Case: [id number] Date/Time: 01/09/22 1015   Procedure: LAPAROSCOPIC CHOLECYSTECTOMY   Anesthesia type: General   Pre-op diagnosis: BILIARY DYSKINESIA   Location: [facility]   Surgeons: Kinsinger, Arta Bruce, MD       DISCUSSION:41 y.o. former smoker with h/o HTN, sleep apnea, on 2L O2 PRN and at bedtime, asthma, RBBB, biliary dyskinesia scheduled for above procedure 01/09/2022 with Dr. Gurney Maxin.   Bladder stimulator in place.   Pt last seen by cardiology 10/21/2021.  Follows for chronic respiratory failure on O2 and sleep apnea.  Stable at this visit.   Pt last seen by pulmonology 06/04/2021.  VS: BP 132/86   Pulse 78   Temp 36.7 C (Oral)   Resp 16   Ht 5\' 9"  (1.753 m)   Wt (!) 145.2 kg   SpO2 96%   BMI 47.26 kg/m   PROVIDERS: Marga Hoots, NP is PCP   Cardiologist - Berniece Salines, DO LABS: Labs reviewed: Acceptable for surgery. (all labs ordered are listed, but only abnormal results are displayed)  Labs Reviewed  BASIC METABOLIC PANEL - Abnormal; Notable for the following components:      Result Value   Glucose, Bld 129 (*)    Calcium 8.8 (*)    All other components within normal limits  CBC - Abnormal; Notable for the following components:   WBC 13.0 (*)    All other components within normal limits  GLUCOSE, CAPILLARY - Abnormal; Notable for the following components:   Glucose-Capillary 144 (*)    All other components within normal limits  HEMOGLOBIN A1C - Abnormal; Notable for the following components:   Hgb A1c MFr Bld 6.4 (*)    All other components within normal limits     IMAGES:   EKG:   CV: Echo 03/13/2020 1. Left ventricular ejection fraction, by estimation, is 60 to 65%. The  left ventricle has normal function. The left ventricle has no regional  wall motion abnormalities. Left ventricular diastolic parameters were  normal.   2. Right ventricular systolic function is normal. The right ventricular   size is normal.   3. The mitral valve is normal in structure. No evidence of mitral valve  regurgitation. No evidence of mitral stenosis.   4. The aortic valve is normal in structure. Aortic valve regurgitation is  not visualized. No aortic stenosis is present.   5. The inferior vena cava is normal in size with greater than 50%  respiratory variability, suggesting right atrial pressure of 3 mmHg.  Past Medical History:  Diagnosis Date   Alcohol abuse 02/21/2014   Allergic rhinitis 01/31/2008   Qualifier: Diagnosis of  By: Lenn Cal (Deborra Medina), [place] Coke of this note might be different from the original. Overview:  Qualifier: Diagnosis of  By: Lenn Cal Deborra Medina), Susanne   Anemia    Anxiety 07/22/2016   Arthritis 07/22/2016   Formatting of this note might be different from the original. Knees Formatting of this note might be different from the original. Overview:  Knees   Asthma 01/31/2008   Qualifier: Diagnosis of  By: Lenn Cal Deborra Medina), Lindwood Coke of this note might be different from the original. Overview:  Qualifier: Diagnosis of  By: Lenn Cal Deborra Medina), Wynona Canes   ASTHMA 01/31/2008   Qualifier: Diagnosis of  By: Lenn Cal Deborra Medina), Susanne     Bell's palsy    age 41-14   Bilateral leg edema 02/15/2020   Body mass index (BMI)  of 50.0 to 59.9 in adult [facility]) 01/06/2017   Chest discomfort 03/19/2020   Complex atypical endometrial hyperplasia 08/01/2013   Constipation 06/28/2018   DYSPNEA 01/31/2008   Qualifier: Diagnosis of  By: Vassie Loll MD, [place] Locket.     Episodic mood disorder (HCC) 02/04/2012   Essential hypertension 02/03/2011   Folliculitis 10/12/2017   GERD (gastroesophageal reflux disease) 12/03/2012   History of Helicobacter pylori infection 03/03/2017   Hx of migraines 02/13/2020   Hyperlipidemia    Hypertension    Intertrigo 09/01/2017   Lumbosacral radiculopathy 09/01/2013   Major depressive disorder, recurrent episode (HCC) 05/02/2014   Medication overuse  headache 07/27/2014   Migraine with aura and without status migrainosus, not intractable 07/27/2014   Formatting of this note might be different from the original. Follows with Dr. Weston Settle in Neurology. HX pseudotumor cerebri   Mixed stress and urge urinary incontinence 08/12/2013   Morbid obesity (HCC) 02/15/2020   Multiple allergies 02/13/2020   NSVT (nonsustained ventricular tachycardia) (HCC) 03/19/2020   Obstructive sleep apnea on CPAP 12/27/2010   Formatting of this note might be different from the original. Uses Cpap  Formatting of this note might be different from the original. AHI=21.8 AutoPAP at 15-20 with an EPR=3  Last Assessment & Plan:  Formatting of this note might be different from the original. 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 Relevant Hx: Course: Daily Update: Today's Plan:  Electronically signed by: Seward Speck, MD Resident 12/22/14 [phone]   Prediabetes 06/30/2017   Formatting of this note might be different from the original. A1C 6.1 on 06/2017   Primary hypertension 02/15/2020   Primary osteoarthritis of right knee 10/12/2012   Pseudotumor    PVC (premature ventricular contraction) 03/19/2020   Right bundle branch block 02/15/2020   Right cervical radiculopathy 07/18/2014   Sleep apnea    SVT (supraventricular tachycardia) 07/15/2016   Tobacco use 12/03/2011   Umbilical cyst 06/02/2011   Urinary retention 08/19/2013   Formatting of this note might be different from the original. S/P interstim placement with Urology   Vulvovaginal candidiasis 03/03/2017    Past Surgical History:  Procedure Laterality Date   BLADDER  SURGERY     EYELID LACERATION REPAIR Bilateral    NASAL SEPTUM SURGERY     TONSILLECTOMY AND ADENOIDECTOMY     VAGINAL HYSTERECTOMY  2015    MEDICATIONS:  Cholecalciferol (VITAMIN D3) 1.25 MG (50000 UT) CAPS   EMGALITY 120 MG/ML SOAJ   EPINEPHrine 0.3 mg/0.3 mL IJ SOAJ injection   FARXIGA 5 MG TABS tablet   FEROSUL 325 (65 Fe) MG tablet   flecainide (TAMBOCOR) 150 MG tablet   furosemide (LASIX) 40 MG tablet   linaclotide (LINZESS) 290 MCG CAPS capsule   loratadine (CLARITIN) 10 MG tablet   Pediatric Multiple Vitamins (FLINTSTONES MULTIVITAMIN PO)   potassium chloride SA (KLOR-CON M20) 20 MEQ tablet   promethazine (PHENERGAN) 12.5 MG tablet   propranolol (INDERAL) 10 MG tablet   RISPERDAL CONSTA 25 MG injection   sertraline (ZOLOFT) 100 MG tablet   traZODone (DESYREL) 50 MG tablet   Ubrogepant (UBRELVY) 100 MG TABS   vitamin B-12 (CYANOCOBALAMIN) 1000 MCG tablet   No  current facility-administered medications for this encounter.    Konrad Felix Ward, PA-C [facility] [phone]

## 2022-01-07 ENCOUNTER — Encounter: Payer: Medicare Other | Attending: Cardiology | Admitting: Skilled Nursing Facility1

## 2022-01-07 ENCOUNTER — Encounter: Payer: Self-pay | Admitting: Skilled Nursing Facility1

## 2022-01-07 DIAGNOSIS — Z713 Dietary counseling and surveillance: Secondary | ICD-10-CM | POA: Diagnosis not present

## 2022-01-07 DIAGNOSIS — Z6841 Body Mass Index (BMI) 40.0 and over, adult: Secondary | ICD-10-CM | POA: Insufficient documentation

## 2022-01-07 DIAGNOSIS — R7303 Prediabetes: Secondary | ICD-10-CM | POA: Diagnosis present

## 2022-01-07 DIAGNOSIS — I10 Essential (primary) hypertension: Secondary | ICD-10-CM | POA: Insufficient documentation

## 2022-01-07 NOTE — Progress Notes (Signed)
Medical Nutrition Therapy   Primary concerns today: Weight loss, glycemic control.  Referral diagnosis: E66.01 - Morbid Obesity Preferred learning style: No preference indicated Learning readiness: Ready   NUTRITION ASSESSMENT   Anthropometrics  Ht: 5'9" Wt: 321 lbs    Clinical Medical Hx: HTN, HLD, GERD, diabetes, Asthma Medications: See list: started Lipitor Labs: A1c - 6.4, calcium 8.8 Notable Signs/Symptoms: N/A   Lifestyle & Dietary Hx   Pt states this past month or so has been tough due to her depression which may have started the disordered eating patterns.   Pt states she started doing the respiridol shot to avoid not taking her medicine.  Pt states she did gain a little weight since her lowest.   Pt states she started skipping lunch.   Pt states she is getting her gallbladder out in the next week or so.   Pt states she tried one plant based protein product and did not like it.   Food allergies: Oranges (Breaks out in a rash), pickles and tomatoes (severe GI pain due to seeds)  Estimated daily fluid intake: 50 oz Supplements: Daily multivitamin, Vitamin D (50,000 IU), B-12 1000 mcg Sleep: OSA, uses CPAP Stress / self-care: Usually 6/10, family troubles caring for mother Current average weekly physical activity: [employer] 3-6 days a week walking and some weight machines    24-Hr Dietary Recall First Meal 9-10: egg sandwich with mayo on white bread Snack:  Second Meal:  Snack: Third Meal 6-9pm: green beans Snack:  Beverages: coffee, water   NUTRITION DIAGNOSIS  NB-1.1 Food and nutrition-related knowledge deficit As related to obesity.  As evidenced by  limited physical activity, and dinner meals high in starches..   NUTRITION INTERVENTION  Nutrition education (E-1) on the following topics: Continued  Educated patient on the balanced plate eating model. Recommended lunch and dinner be 1/2 non-starchy vegetables, 1/4 starches, and 1/4 protein.  Recommended breakfast be a balance of starch and protein with a piece of fruit. Discussed with patient the importance of working towards hitting the proportions of the balanced plate consistently.  Educated patient on factors that contribute to elevation of blood sugars, such as stress, illness, injury,and food choices. Discussed the role that physical activity plays in lowering blood sugar. Educate patient on the three main macronutrients. Protein, fats, and carbohydrates. Discussed how each of these macronutrients affect blood sugar levels, especially carbohydrate, and the importance of eating a consistent amount of carbohydrate throughout the day.  Re-Educated patient on portion control RE-Educated pt on complex carbohydrates    Handouts Previously Provided Include  Balanced Plate Balanced Plate Food List Mindful meals exercise Should I eat sheet  Learning Style & Readiness for Change Teaching method utilized: Visual & Auditory  Demonstrated degree of understanding via: Teach Back  Barriers to learning/adherence to lifestyle change: Limited capacity for physical activity  Goals Established by Pt Try switching to 2% milk from whole milk. Start your day with a Glucerna for breakfast. Continue to be more physically active as your health is improving.  Start by going to the gym one day next week. Walk your dog as much as possible. Set your step goal to 6,000 - 7,000 a day. Continue: Look for "Steam In Bag" frozen vegetables in the freezer aisle. Keep a stash in your freezer! Continue: Congratulations on quitting smoking!! Talk with your therapist about using sweets to replace cravings for cigarettes. Think about what things drive you to have these cravings, and what type of satisfaction they actually  provide. continue: ask your mom to go teach you how to cook the recipes provided  continue: eat one of the vegetables you like daily continue: try peppers and onions in eggs for  breakfast NEW:/continue add protein with dinner try a plant based frozen option or any protein option you are willing to eat NEW/continue: try greek yogurt or peanut butter NEW: try canned or packaged soup for lunch and dinner or healthy choice power bowl NEW: drink at least 3 bottles of water every day  MONITORING & EVALUATION Dietary intake, weekly physical activity, and weight   Next Steps  Patient is to follow with [facility] 2 months

## 2022-01-08 NOTE — Anesthesia Preprocedure Evaluation (Signed)
Anesthesia Evaluation  Patient identified by MRN, date of birth, ID band Patient awake    Reviewed: Allergy & Precautions, NPO status , Patient's Chart, lab work & pertinent test results  History of Anesthesia Complications Negative for: history of anesthetic complications  Airway Mallampati: II  TM Distance: >3 FB Neck ROM: Full    Dental  (+) Missing, Poor Dentition,    Pulmonary asthma , sleep apnea and Continuous Positive Airway Pressure Ventilation , Patient abstained from smoking., former smoker,    Pulmonary exam normal        Cardiovascular hypertension, Pt. on medications Normal cardiovascular exam+ dysrhythmias Supra Ventricular Tachycardia and Ventricular Tachycardia   TTE 2021: EF 60-65%, normal valves   Neuro/Psych  Headaches, Anxiety Depression    GI/Hepatic Neg liver ROS, GERD  Controlled,BILIARY DYSKINESIA   Endo/Other  diabetes, Type 2, Oral Hypoglycemic AgentsMorbid obesity (BMI 47)  Renal/GU negative Renal ROS  negative genitourinary   Musculoskeletal  (+) Arthritis ,   Abdominal   Peds  Hematology negative hematology ROS (+)   Anesthesia Other Findings Day of surgery medications reviewed with patient.  Reproductive/Obstetrics negative OB ROS                          Anesthesia Physical Anesthesia Plan  ASA: 3  Anesthesia Plan: General   Post-op Pain Management: Tylenol PO (pre-op)*   Induction: Intravenous  PONV Risk Score and Plan: 3 and Treatment may vary due to age or medical condition, Midazolam, Dexamethasone and Ondansetron  Airway Management Planned: Oral ETT  Additional Equipment: None  Intra-op Plan:   Post-operative Plan: Extubation in OR  Informed Consent: I have reviewed the patients History and Physical, chart, labs and discussed the procedure including the risks, benefits and alternatives for the proposed anesthesia with the patient or  authorized representative who has indicated his/her understanding and acceptance.     Dental advisory given  Plan Discussed with: CRNA  Anesthesia Plan Comments: (Discussed listed fentanyl allergy with patient in preop. She had one episode in PACU after a prior surgery where she had pre-syncope after receiving fentanyl. She has since received fentanyl without complications. I discussed with her that this event likely does not represent a true allergy and we would use fentanyl during her case today judiciously. All questions answered. Daiva Huge, MD)       Anesthesia Quick Evaluation

## 2022-01-09 ENCOUNTER — Other Ambulatory Visit: Payer: Self-pay

## 2022-01-09 ENCOUNTER — Ambulatory Visit (HOSPITAL_BASED_OUTPATIENT_CLINIC_OR_DEPARTMENT_OTHER): Payer: Medicare Other | Admitting: Anesthesiology

## 2022-01-09 ENCOUNTER — Ambulatory Visit (HOSPITAL_COMMUNITY)
Admission: RE | Admit: 2022-01-09 | Discharge: 2022-01-09 | Disposition: A | Discharge status: Home / Self Care | Payer: Medicare Other | Attending: General Surgery | Admitting: General Surgery

## 2022-01-09 ENCOUNTER — Encounter (HOSPITAL_COMMUNITY): Payer: Self-pay | Admitting: General Surgery

## 2022-01-09 ENCOUNTER — Encounter (HOSPITAL_COMMUNITY)
Admission: RE | Disposition: A | Discharge status: Home / Self Care | Payer: Self-pay | Source: Home / Self Care | Attending: General Surgery

## 2022-01-09 ENCOUNTER — Ambulatory Visit (HOSPITAL_COMMUNITY): Payer: Medicare Other | Admitting: Physician Assistant

## 2022-01-09 DIAGNOSIS — K828 Other specified diseases of gallbladder: Secondary | ICD-10-CM | POA: Insufficient documentation

## 2022-01-09 DIAGNOSIS — I10 Essential (primary) hypertension: Secondary | ICD-10-CM | POA: Diagnosis not present

## 2022-01-09 DIAGNOSIS — R519 Headache, unspecified: Secondary | ICD-10-CM | POA: Insufficient documentation

## 2022-01-09 DIAGNOSIS — Z9989 Dependence on other enabling machines and devices: Secondary | ICD-10-CM

## 2022-01-09 DIAGNOSIS — E119 Type 2 diabetes mellitus without complications: Secondary | ICD-10-CM | POA: Insufficient documentation

## 2022-01-09 DIAGNOSIS — J45909 Unspecified asthma, uncomplicated: Secondary | ICD-10-CM | POA: Diagnosis not present

## 2022-01-09 DIAGNOSIS — F418 Other specified anxiety disorders: Secondary | ICD-10-CM

## 2022-01-09 DIAGNOSIS — K811 Chronic cholecystitis: Secondary | ICD-10-CM | POA: Diagnosis not present

## 2022-01-09 DIAGNOSIS — I471 Supraventricular tachycardia, unspecified: Secondary | ICD-10-CM | POA: Insufficient documentation

## 2022-01-09 DIAGNOSIS — F17210 Nicotine dependence, cigarettes, uncomplicated: Secondary | ICD-10-CM | POA: Insufficient documentation

## 2022-01-09 DIAGNOSIS — Z6841 Body Mass Index (BMI) 40.0 and over, adult: Secondary | ICD-10-CM | POA: Insufficient documentation

## 2022-01-09 DIAGNOSIS — G4730 Sleep apnea, unspecified: Secondary | ICD-10-CM | POA: Insufficient documentation

## 2022-01-09 DIAGNOSIS — G4733 Obstructive sleep apnea (adult) (pediatric): Secondary | ICD-10-CM

## 2022-01-09 HISTORY — PX: CHOLECYSTECTOMY: SHX55

## 2022-01-09 LAB — GLUCOSE, CAPILLARY
Glucose-Capillary: 108 mg/dL — ABNORMAL HIGH (ref 70–99)
Glucose-Capillary: 160 mg/dL — ABNORMAL HIGH (ref 70–99)

## 2022-01-09 SURGERY — LAPAROSCOPIC CHOLECYSTECTOMY
Anesthesia: General

## 2022-01-09 MED ORDER — EPHEDRINE 5 MG/ML INJ
INTRAVENOUS | Status: AC
  Filled 2022-01-09: qty 5

## 2022-01-09 MED ORDER — DEXAMETHASONE SODIUM PHOSPHATE 10 MG/ML IJ SOLN
INTRAMUSCULAR | Status: DC | PRN
  Administered 2022-01-09: 5 mg via INTRAVENOUS

## 2022-01-09 MED ORDER — DEXAMETHASONE SODIUM PHOSPHATE 10 MG/ML IJ SOLN
INTRAMUSCULAR | Status: AC
  Filled 2022-01-09: qty 1

## 2022-01-09 MED ORDER — LIDOCAINE 2% (20 MG/ML) 5 ML SYRINGE
INTRAMUSCULAR | Status: DC | PRN
  Administered 2022-01-09: 100 mg via INTRAVENOUS

## 2022-01-09 MED ORDER — ONDANSETRON HCL 4 MG/2ML IJ SOLN
INTRAMUSCULAR | Status: AC
  Filled 2022-01-09: qty 2

## 2022-01-09 MED ORDER — HYDROMORPHONE HCL 1 MG/ML IJ SOLN
0.2500 mg | INTRAMUSCULAR | Status: DC | PRN
  Administered 2022-01-09: 0.5 mg via INTRAVENOUS

## 2022-01-09 MED ORDER — ROCURONIUM BROMIDE 10 MG/ML (PF) SYRINGE
PREFILLED_SYRINGE | INTRAVENOUS | Status: AC
  Filled 2022-01-09: qty 10

## 2022-01-09 MED ORDER — FENTANYL CITRATE (PF) 100 MCG/2ML IJ SOLN
INTRAMUSCULAR | Status: AC
  Filled 2022-01-09: qty 2

## 2022-01-09 MED ORDER — PROPOFOL 10 MG/ML IV BOLUS
INTRAVENOUS | Status: AC
  Filled 2022-01-09: qty 20

## 2022-01-09 MED ORDER — MIDAZOLAM HCL 2 MG/2ML IJ SOLN
INTRAMUSCULAR | Status: AC
  Filled 2022-01-09: qty 2

## 2022-01-09 MED ORDER — CHLORHEXIDINE GLUCONATE 0.12 % MT SOLN
15.0000 mL | Freq: Once | OROMUCOSAL | Status: AC
  Administered 2022-01-09: 15 mL via OROMUCOSAL

## 2022-01-09 MED ORDER — ONDANSETRON HCL 4 MG/2ML IJ SOLN
INTRAMUSCULAR | Status: DC | PRN
  Administered 2022-01-09: 4 mg via INTRAVENOUS

## 2022-01-09 MED ORDER — BUPIVACAINE-EPINEPHRINE 0.25% -1:200000 IJ SOLN
INTRAMUSCULAR | Status: DC | PRN
  Administered 2022-01-09: 30 mL

## 2022-01-09 MED ORDER — DROPERIDOL 2.5 MG/ML IJ SOLN: 0.6250 mg | Freq: Once | INTRAMUSCULAR | Status: DC | PRN

## 2022-01-09 MED ORDER — LACTATED RINGERS IV SOLN: INTRAVENOUS | Status: DC

## 2022-01-09 MED ORDER — PROPOFOL 10 MG/ML IV BOLUS
INTRAVENOUS | Status: DC | PRN
  Administered 2022-01-09: 200 mg via INTRAVENOUS

## 2022-01-09 MED ORDER — CHLORHEXIDINE GLUCONATE CLOTH 2 % EX PADS: 6.0000 | MEDICATED_PAD | Freq: Once | CUTANEOUS | Status: DC

## 2022-01-09 MED ORDER — FENTANYL CITRATE (PF) 100 MCG/2ML IJ SOLN
INTRAMUSCULAR | Status: DC | PRN
  Administered 2022-01-09: 50 ug via INTRAVENOUS
  Administered 2022-01-09: 100 ug via INTRAVENOUS

## 2022-01-09 MED ORDER — ROCURONIUM BROMIDE 10 MG/ML (PF) SYRINGE
PREFILLED_SYRINGE | INTRAVENOUS | Status: DC | PRN
  Administered 2022-01-09: 60 mg via INTRAVENOUS

## 2022-01-09 MED ORDER — SUGAMMADEX SODIUM 500 MG/5ML IV SOLN
INTRAVENOUS | Status: DC | PRN
  Administered 2022-01-09: 300 mg via INTRAVENOUS

## 2022-01-09 MED ORDER — EPHEDRINE SULFATE-NACL 50-0.9 MG/10ML-% IV SOSY
PREFILLED_SYRINGE | INTRAVENOUS | Status: DC | PRN
  Administered 2022-01-09: 5 mg via INTRAVENOUS

## 2022-01-09 MED ORDER — 0.9 % SODIUM CHLORIDE (POUR BTL) OPTIME
TOPICAL | Status: DC | PRN
  Administered 2022-01-09: 1000 mL

## 2022-01-09 MED ORDER — LIDOCAINE HCL (PF) 2 % IJ SOLN
INTRAMUSCULAR | Status: AC
  Filled 2022-01-09: qty 5

## 2022-01-09 MED ORDER — ACETAMINOPHEN 500 MG PO TABS
1000.0000 mg | ORAL_TABLET | ORAL | Status: AC
  Administered 2022-01-09: 1000 mg via ORAL
  Filled 2022-01-09: qty 2

## 2022-01-09 MED ORDER — BUPIVACAINE-EPINEPHRINE (PF) 0.25% -1:200000 IJ SOLN
INTRAMUSCULAR | Status: AC
  Filled 2022-01-09: qty 30

## 2022-01-09 MED ORDER — CEFAZOLIN IN SODIUM CHLORIDE 3-0.9 GM/100ML-% IV SOLN
3.0000 g | INTRAVENOUS | Status: AC
  Administered 2022-01-09: 3 g via INTRAVENOUS
  Filled 2022-01-09: qty 100

## 2022-01-09 MED ORDER — SUGAMMADEX SODIUM 500 MG/5ML IV SOLN
INTRAVENOUS | Status: AC
  Filled 2022-01-09: qty 5

## 2022-01-09 MED ORDER — HYDROCODONE-ACETAMINOPHEN 5-325 MG PO TABS: 1.0000 | ORAL_TABLET | Freq: Four times a day (QID) | ORAL | 0 refills | Status: DC | PRN

## 2022-01-09 MED ORDER — ACETAMINOPHEN 500 MG PO TABS: 1000.0000 mg | ORAL_TABLET | Freq: Once | ORAL | Status: DC

## 2022-01-09 MED ORDER — ORAL CARE MOUTH RINSE: 15.0000 mL | Freq: Once | OROMUCOSAL | Status: AC

## 2022-01-09 MED ORDER — MIDAZOLAM HCL 5 MG/5ML IJ SOLN
INTRAMUSCULAR | Status: DC | PRN
  Administered 2022-01-09 (×2): 1 mg via INTRAVENOUS

## 2022-01-09 MED ORDER — HYDROMORPHONE HCL 1 MG/ML IJ SOLN
INTRAMUSCULAR | Status: AC
  Filled 2022-01-09: qty 1

## 2022-01-09 MED ORDER — LACTATED RINGERS IR SOLN
Status: DC | PRN
  Administered 2022-01-09: 1000 mL

## 2022-01-09 SURGICAL SUPPLY — 48 items
APL PRP STRL LF DISP 70% ISPRP (MISCELLANEOUS) ×1
APL SKNCLS STERI-STRIP NONHPOA (GAUZE/BANDAGES/DRESSINGS)
APPLIER CLIP 5 13 M/L LIGAMAX5 (MISCELLANEOUS) ×1
APPLIER CLIP ROT 10 11.4 M/L (STAPLE)
APR CLP MED LRG 11.4X10 (STAPLE)
APR CLP MED LRG 5 ANG JAW (MISCELLANEOUS) ×1
BAG COUNTER SPONGE SURGICOUNT (BAG) IMPLANT
BAG SPEC RTRVL 10 TROC 200 (ENDOMECHANICALS) ×1
BAG SPNG CNTER NS LX DISP (BAG)
BENZOIN TINCTURE PRP APPL 2/3 (GAUZE/BANDAGES/DRESSINGS) IMPLANT
BNDG ADH 1X3 SHEER STRL LF (GAUZE/BANDAGES/DRESSINGS) IMPLANT
BNDG ADH THN 3X1 STRL LF (GAUZE/BANDAGES/DRESSINGS)
CABLE HIGH FREQUENCY MONO STRZ (ELECTRODE) ×2 IMPLANT
CHLORAPREP W/TINT 26 (MISCELLANEOUS) ×2 IMPLANT
CLIP APPLIE 5 13 M/L LIGAMAX5 (MISCELLANEOUS) ×2 IMPLANT
CLIP APPLIE ROT 10 11.4 M/L (STAPLE) IMPLANT
CLIP LIGATING HEM O LOK PURPLE (MISCELLANEOUS) IMPLANT
CLIP LIGATING HEMO O LOK GREEN (MISCELLANEOUS) ×2 IMPLANT
COVER MAYO STAND XLG (MISCELLANEOUS) ×2 IMPLANT
COVER SURGICAL LIGHT HANDLE (MISCELLANEOUS) ×2 IMPLANT
DRAIN CHANNEL 19F RND (DRAIN) IMPLANT
DRAPE C-ARM 42X120 X-RAY (DRAPES) IMPLANT
EVACUATOR SILICONE 100CC (DRAIN) IMPLANT
GLOVE BIOGEL PI IND STRL 7.0 (GLOVE) ×2 IMPLANT
GLOVE SURG SS PI 7.0 STRL IVOR (GLOVE) ×2 IMPLANT
GOWN STRL REUS W/ TWL LRG LVL3 (GOWN DISPOSABLE) ×2 IMPLANT
GOWN STRL REUS W/TWL LRG LVL3 (GOWN DISPOSABLE) ×1
GRASPER SUT TROCAR 14GX15 (MISCELLANEOUS) ×2 IMPLANT
IRRIG SUCT STRYKERFLOW 2 WTIP (MISCELLANEOUS) ×1
IRRIGATION SUCT STRKRFLW 2 WTP (MISCELLANEOUS) ×2 IMPLANT
KIT BASIN OR (CUSTOM PROCEDURE TRAY) ×2 IMPLANT
KIT TURNOVER KIT A (KITS) IMPLANT
POUCH RETRIEVAL ECOSAC 10 (ENDOMECHANICALS) ×2 IMPLANT
POUCH RETRIEVAL ECOSAC 10MM (ENDOMECHANICALS) ×1
SCISSORS LAP 5X35 DISP (ENDOMECHANICALS) ×2 IMPLANT
SET CHOLANGIOGRAPH MIX (MISCELLANEOUS) IMPLANT
SET TUBE SMOKE EVAC HIGH FLOW (TUBING) ×2 IMPLANT
SLEEVE Z-THREAD 5X100MM (TROCAR) ×4 IMPLANT
SPIKE FLUID TRANSFER (MISCELLANEOUS) ×2 IMPLANT
STRIP CLOSURE SKIN 1/2X4 (GAUZE/BANDAGES/DRESSINGS) IMPLANT
SUT ETHILON 2 0 PS N (SUTURE) IMPLANT
SUT MNCRL AB 4-0 PS2 18 (SUTURE) ×2 IMPLANT
SUT VICRYL 0 ENDOLOOP (SUTURE) IMPLANT
TOWEL OR 17X26 10 PK STRL BLUE (TOWEL DISPOSABLE) ×2 IMPLANT
TOWEL OR NON WOVEN STRL DISP B (DISPOSABLE) IMPLANT
TRAY LAPAROSCOPIC (CUSTOM PROCEDURE TRAY) ×2 IMPLANT
TROCAR 11X100 Z THREAD (TROCAR) ×2 IMPLANT
TROCAR Z-THREAD OPTICAL 5X100M (TROCAR) ×2 IMPLANT

## 2022-01-09 NOTE — Op Note (Signed)
PATIENT:  Ellen Shaw  41 y.o. female  PRE-OPERATIVE DIAGNOSIS:  BILIARY DYSKINESIA  POST-OPERATIVE DIAGNOSIS:  BILIARY DYSKINESIA  PROCEDURE:  Procedure(s): LAPAROSCOPIC CHOLECYSTECTOMY  SURGEON:  Dermot Gremillion, Arta Bruce, MD   ASSISTANT: none  ANESTHESIA:   local and general  Indications for procedure: Ellen Shaw is a 41 y.o. female with symptoms of Abdominal pain consistent with gallbladder disease, Confirmed by nuclear medicine study.  Description of procedure: The patient was brought into the operative suite, placed supine. Anesthesia was administered with endotracheal tube. Patient was strapped in place and foot board was secured. All pressure points were offloaded by foam padding. The patient was prepped and draped in the usual sterile fashion.  A periumbilical incision was made and optical entry was used to enter the abdomen. 2 5 mm trocars were placed on in the right lateral space on in the right subcostal space. A 16mm trocar was placed in the subxiphoid space. Marcaine with Epinephrine was infused to the subxiphoid space and lateral upper right abdomen in the transversus abdominis plane. Next the patient was placed in reverse trendelenberg. The gallbladder appearedchronically inflamed.   The gallbladder was retracted cephalad and lateral. The peritoneum was reflected off the infundibulum working lateral to medial. The cystic duct and cystic artery were identified and further dissection revealed a critical view.  The cystic duct and cystic artery were doubly clipped and ligated.   The gallbladder was removed off the liver bed with cautery. The Gallbladder was placed in a specimen bag. The gallbladder fossa was irrigated and hemostasis was applied with cautery. The gallbladder was removed via the 61mm trocar. No dilation was required for removal, therefore no fascial closure was performed. Pneumoperitoneum was removed, all trocar were removed. All incisions were closed with  4-0 monocryl subcuticular stitch. The patient woke from anesthesia and was brought to PACU in stable condition. All counts were correct  Findings: chronic inflammation of gallbladder  Specimen: gallbladder  Blood loss: 20 ml  Local anesthesia: 30 ml Marcaine with Epinephrine  Complications: none  PLAN OF CARE: Discharge to home after PACU  PATIENT DISPOSITION:  PACU - hemodynamically stable.  [facility], [place]

## 2022-01-09 NOTE — Anesthesia Postprocedure Evaluation (Signed)
Anesthesia Post Note  Patient: Ellen Shaw  Procedure(s) Performed: LAPAROSCOPIC CHOLECYSTECTOMY     Patient location during evaluation: PACU Anesthesia Type: General Level of consciousness: awake and alert Pain management: pain level controlled Vital Signs Assessment: post-procedure vital signs reviewed and stable Respiratory status: spontaneous breathing, nonlabored ventilation and respiratory function stable Cardiovascular status: blood pressure returned to baseline Postop Assessment: no apparent nausea or vomiting Anesthetic complications: no   No notable events documented.  Last Vitals:  Vitals:   01/09/22 1015 01/09/22 1100  BP: (!) 120/59 112/67  Pulse: (!) 57 66  Resp: 12 10  Temp: 36.6 C   SpO2: 97% 95%    Last Pain:  Vitals:   01/09/22 1053  TempSrc:   PainSc: [place]

## 2022-01-09 NOTE — Anesthesia Procedure Notes (Signed)
Procedure Name: Intubation Date/Time: 01/09/2022 9:17 AM  Performed by: Victoriano Lain, CRNAPre-anesthesia Checklist: Patient identified, Emergency Drugs available, Suction available, Patient being monitored and Timeout performed Patient Re-evaluated:Patient Re-evaluated prior to induction Oxygen Delivery Method: Circle system utilized Preoxygenation: Pre-oxygenation with 100% oxygen Induction Type: IV induction Ventilation: Mask ventilation without difficulty Laryngoscope Size: Mac and 4 Grade View: Grade I Tube type: Oral Tube size: 7.5 mm Number of attempts: 1 Airway Equipment and Method: Stylet Placement Confirmation: ETT inserted through vocal cords under direct vision, positive ETCO2 and breath sounds checked- equal and bilateral Secured at: 21 cm Tube secured with: Tape Dental Injury: Teeth and Oropharynx as per pre-operative assessment

## 2022-01-09 NOTE — Transfer of Care (Signed)
Immediate Anesthesia Transfer of Care Note  Patient: Ellen Shaw  Procedure(s) Performed: LAPAROSCOPIC CHOLECYSTECTOMY  Patient Location: PACU  Anesthesia Type:General  Level of Consciousness: awake, alert , oriented and patient cooperative  Airway & Oxygen Therapy: Patient Spontanous Breathing and Patient connected to face mask oxygen  Post-op Assessment: Report given to RN, Post -op Vital signs reviewed and stable and Patient moving all extremities  Post vital signs: Reviewed and stable  Last Vitals:  Vitals Value Taken Time  BP 120/59 01/09/22 1015  Temp    Pulse 57 01/09/22 1018  Resp 17 01/09/22 1018  SpO2 99 % 01/09/22 1018  Vitals shown include unvalidated device data.  Last Pain:  Vitals:   01/09/22 0656  TempSrc: Oral         Complications: No notable events documented.

## 2022-01-09 NOTE — H&P (Signed)
Chief Complaint: New Consultation (gallbladder)   History of Present Illness: Ellen Shaw is a 41 y.o. female who is seen today as an office consultation at the request of Dr. Ranae Pila for evaluation of New Consultation (gallbladder) .   She has had 4 months of right upper quadrant abdominal pain. Pain does not radiate. It seems to come after eating especially fatty foods. She has had more loose bowel movements in the last months. She has some nausea but no vomiting.  Review of Systems: A complete review of systems was obtained from the patient. I have reviewed this information and discussed as appropriate with the patient. See HPI as well for other ROS.  Review of Systems  Constitutional: Negative.  HENT: Negative.  Eyes: Negative.  Respiratory: Negative.  Cardiovascular: Negative.  Gastrointestinal: Positive for abdominal pain and nausea.  Genitourinary: Negative.  Musculoskeletal: Negative.  Skin: Negative.  Neurological: Negative.  Endo/Heme/Allergies: Negative.  Psychiatric/Behavioral: Negative.   Medical History: Past Medical History:  Diagnosis Date  Anxiety  Diabetes mellitus without complication (CMS-HCC)  Sleep apnea   There is no problem list on file for this patient.  Past Surgical History:  Procedure Laterality Date  nose surgery 2023  bladder stimulater  2016 and 2023  HYSTERECTOMY    Allergies  Allergen Reactions  Ciprofloxacin Anaphylaxis, Rash and Shortness Of Breath  SOB  Clindamycin Shortness Of Breath  Nitrofurantoin Shortness Of Breath  Oxycodone Other (See Comments)  Other: convulsions  Other: "I passed out."  Other reaction(s): Dizziness, Hypotension, Wheezing (ALLERGY/intolerance) "shaking" Other: convulsions "shaking"  "shaking"  Other: "I passed out." Other: convulsions "shaking" Other reaction(s): Dizziness, Hypotension, Wheezing (ALLERGY/intolerance)  Sulfa (Sulfonamide Antibiotics) Rash  Other reaction(s): Rash, Shortness of  breath  Bee Pollen Other (See Comments)  Other-sneeze  Other reaction(s): Cough, Cough (ALLERGY/intolerance) Other-sneeze sneeze  sneeze Stuffy nose  Stuffy nose Other-sneeze  Citrus And Derivatives Hives  Doxycycline Other (See Comments)  Muscle spasms   Bad muscle spasms  Muscle spasms  Bad muscle spasms  Fluticasone Other (See Comments) and Rash  Other reaction(s): Other (See Comments) "Headache and nosebleed" headache  headache  "Headache and nosebleed"  headache Other reaction(s): Other (See Comments) "Headache and nosebleed" headache  Fentanyl Other (See Comments)  Fainting States "I passed out."  Other reaction(s): Other (See Comments) "I pass out." fainting  fainting  "I pass out." Fainting States "I passed out." fainting  Paliperidone Other (See Comments)  Other reaction(s): Dystonia  Dystonia  Pantoprazole Hives  Relieved with Benadryl   Current Outpatient Medications on File Prior to Visit  Medication Sig Dispense Refill  cholecalciferol (VITAMIN D3) 1,250 mcg (50,000 unit) capsule Take 1 capsule by mouth once a week  cyanocobalamin (VITAMIN B12) 1000 MCG tablet Take by mouth  EMGALITY PEN 120 mg/mL PnIj Inject subcutaneously  EPINEPHrine (EPIPEN) 0.3 mg/0.3 mL auto-injector INJECT 1 ML AS NEEDED  FARXIGA 5 mg Tab tablet Take 5 mg by mouth once daily  ferrous sulfate 325 (65 FE) MG tablet Take by mouth  flecainide (TAMBOCOR) 150 MG tablet Take by mouth  FUROsemide (LASIX) 20 MG tablet Take by mouth  linaCLOtide (LINZESS) 290 mcg capsule Take by mouth  loratadine (CLARITIN) 10 mg tablet Take 10 mg by mouth once daily  potassium chloride (KLOR-CON) 10 MEQ ER tablet Take by mouth  risperiDONE (RISPERDAL) 1 MG tablet Take 1 mg by mouth at bedtime  sertraline (ZOLOFT) 100 MG tablet Take by mouth  ubrogepant 100 mg Tab Take 1 at onset of migraine. May  repeat dose in 2 hrs if needed. Max of 2 in 24 hrs   No current facility-administered  medications on file prior to visit.   Family History  Problem Relation Age of Onset  Coronary Artery Disease (Blocked arteries around heart) Mother  Hyperlipidemia (Elevated cholesterol) Mother  High blood pressure (Hypertension) Mother  Hyperlipidemia (Elevated cholesterol) Father  High blood pressure (Hypertension) Father  Obesity Father  Stroke Father  Diabetes Father  High blood pressure (Hypertension) Sister  Obesity Sister  High blood pressure (Hypertension) Brother  Diabetes Brother    Social History   Tobacco Use  Smoking Status Every Day  Packs/day: 0.50  Types: Cigarettes  Smokeless Tobacco Never    Social History   Socioeconomic History  Marital status: Unknown  Tobacco Use  Smoking status: Every Day  Packs/day: 0.50  Types: Cigarettes  Smokeless tobacco: Never  Substance and Sexual Activity  Alcohol use: Not Currently  Drug use: Never   Objective:   Vitals:  11/20/21 1127  BP: 120/80  Pulse: 108  Temp: 36.1 C (97 F)  SpO2: 93%  Weight: (!) 151.2 kg (333 lb 6.4 oz)  Height: 175.3 cm (5\' 9" )   Body mass index is 49.23 kg/m.  Physical Exam Constitutional:  Appearance: Normal appearance.  HENT:  Head: Normocephalic and atraumatic.  Pulmonary:  Effort: Pulmonary effort is normal.  Abdominal:  Comments: Nontender on exam  Musculoskeletal:  General: Normal range of motion.  Cervical back: Normal range of motion.  Neurological:  General: No focal deficit present.  Mental Status: She is alert and oriented to person, place, and time. Mental status is at baseline.  Psychiatric:  Mood and Affect: Mood normal.  Behavior: Behavior normal.  Thought Content: Thought content normal.    Labs, Imaging and Diagnostic Testing: Abdominal ultrasound and Abdominal/Pelvis CT results were reviewed showing no stones or wall thickening I reviewed [facility] notes Assessment and Plan:  Diagnoses and all orders for this visit:  Biliary  dyskinesia  Morbid (severe) obesity due to excess calories (CMS-HCC)    Ellen Shaw is a 41 y.o. female with evidence of gallbladder disease. She does not have findings on imaging but has persistent RUQ pain related to food. We discussed the etiology of gallstones and biliary disease and that can cause pain. We discussed exacerbating factors including fatty meals. We discussed the details of surgery for removal of the gallbladder including general anesthesia, 4 small incisions in the patient's abdomen, removal of the patient's gallbladder with the liver and common bile duct, and most likely an outpatient procedure. We discussed risks of common bile duct injury, cystic duct stump leak, injury to liver, bleeding, infection, need for open procedure, and post cholecystectomy syndrome. She showed good understanding and wanted to proceed with laparoscopic cholecystectomy.

## 2022-01-09 NOTE — Discharge Instructions (Signed)
[facility] ______[facility] LAPAROSCOPIC SURGERY: POST OP INSTRUCTIONS Always review your discharge instruction sheet given to you by the facility where your surgery was performed. IF YOU HAVE DISABILITY OR FAMILY LEAVE FORMS, YOU MUST BRING THEM TO THE OFFICE FOR PROCESSING.   DO NOT GIVE THEM TO YOUR DOCTOR.  A prescription for pain medication may be given to you upon discharge.  Take your pain medication as prescribed, if needed.  If narcotic pain medicine is not needed, then you may take acetaminophen (Tylenol) or ibuprofen (Advil) as needed. Take your usually prescribed medications unless otherwise directed. If you need a refill on your pain medication, please contact your pharmacy.  They will contact our office to request authorization. Prescriptions will not be filled after 5pm or on week-ends. You should follow a light diet the first few days after arrival home, such as soup and crackers, etc.  Be sure to include lots of fluids daily. Most patients will experience some swelling and bruising in the area of the incisions.  Ice packs will help.  Swelling and bruising can take several days to resolve.  It is common to experience some constipation if taking pain medication after surgery.  Increasing fluid intake and taking a stool softener (such as Colace) will usually help or prevent this problem from occurring.  A mild laxative (Milk of Magnesia or Miralax) should be taken according to package instructions if there are no bowel movements after 48 hours. Unless discharge instructions indicate otherwise, you may remove your bandages 24-48 hours after surgery, and you may shower at that time.  You may have steri-strips (small skin tapes) in place directly over the incision.  These strips should be left on the skin for 7-10 days.  If your surgeon used skin glue on the incision, you may shower in 24 hours.  The glue will flake off over the next 2-3 weeks.  Any sutures or staples will be  removed at the office during your follow-up visit. ACTIVITIES:  You may resume regular (light) daily activities beginning the next day--such as daily self-care, walking, climbing stairs--gradually increasing activities as tolerated.  You may have sexual intercourse when it is comfortable.  Refrain from any heavy lifting or straining until approved by your doctor. You may drive when you are no longer taking prescription pain medication, you can comfortably wear a seatbelt, and you can safely maneuver your car and apply brakes. RETURN TO WORK:  __________________________________________________________ You should see your doctor in the office for a follow-up appointment approximately 2-3 weeks after your surgery.  Make sure that you call for this appointment within a day or two after you arrive home to insure a convenient appointment time. OTHER INSTRUCTIONS: __________________________________________________________________________________________________________________________ __________________________________________________________________________________________________________________________ WHEN TO CALL YOUR DOCTOR: Fever over 101.0 Inability to urinate Continued bleeding from incision. Increased pain, redness, or drainage from the incision. Increasing abdominal pain  The clinic staff is available to answer your questions during regular business hours.  Please don't hesitate to call and ask to speak to one of the nurses for clinical concerns.  If you have a medical emergency, go to the nearest emergency room or call 911.  A surgeon from [facility] is always on call at the hospital. [address], [address], [place], [place]  [address] ? [address] [address], [place], [place]   [address] [phone] ? [phone] ? FAX [phone] Web site: [email or web address]  

## 2022-01-10 ENCOUNTER — Encounter (HOSPITAL_COMMUNITY): Payer: Self-pay | Admitting: General Surgery

## 2022-01-10 LAB — SURGICAL PATHOLOGY

## 2022-01-19 IMAGING — CT CT ANGIO CHEST
2 of 7 series · 18 of 46 positions shown · IV contrast (OMNIPAQUE)
Comparison: February 05, 2021

CLINICAL DATA: Chest pain and shortness of breath concern for
pulmonary embolus.

EXAM:
CT ANGIOGRAPHY CHEST WITH CONTRAST
TECHNIQUE: Multidetector CT imaging of the chest was performed using the
standard protocol during bolus administration of intravenous
contrast. Multiplanar CT image reconstructions and MIPs were
obtained to evaluate the vascular anatomy.
CONTRAST:  80mL OMNIPAQUE IOHEXOL 350 MG/ML SOLN

[Series 5: thins · axial · 0.87mm/px · z∈[-6,+299]mm · 16 of 345 slices shown]
[im 20/345  lung]
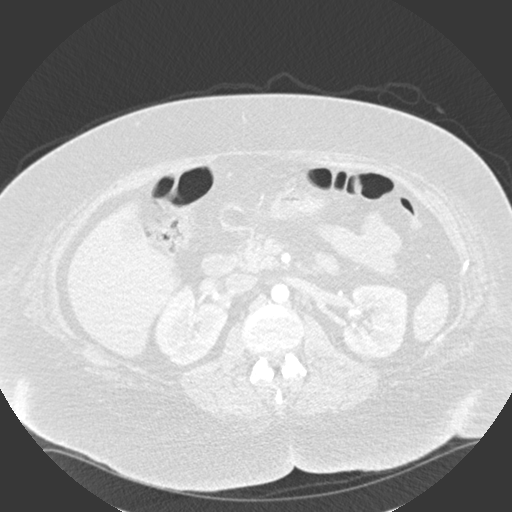
[im 39/345  soft-tissue]
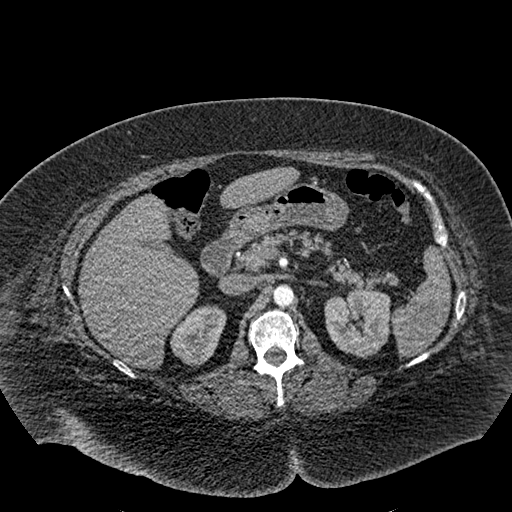
[im 58/345  lung]
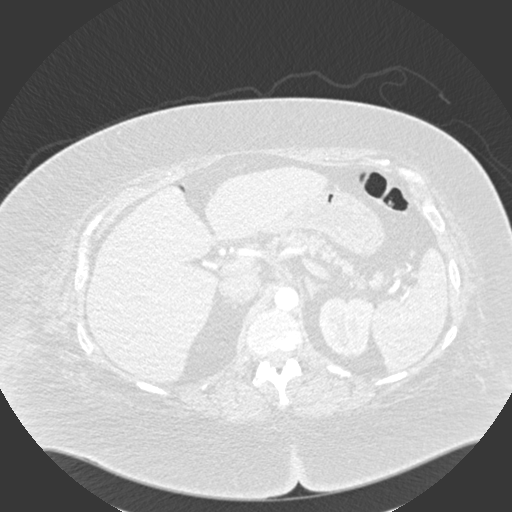
[im 77/345  soft-tissue]
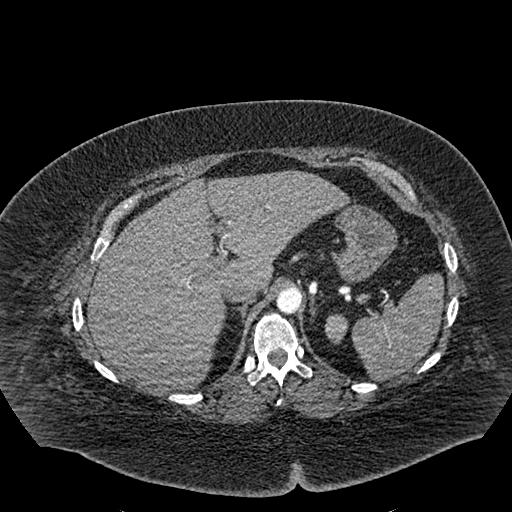
[im 96/345  lung]
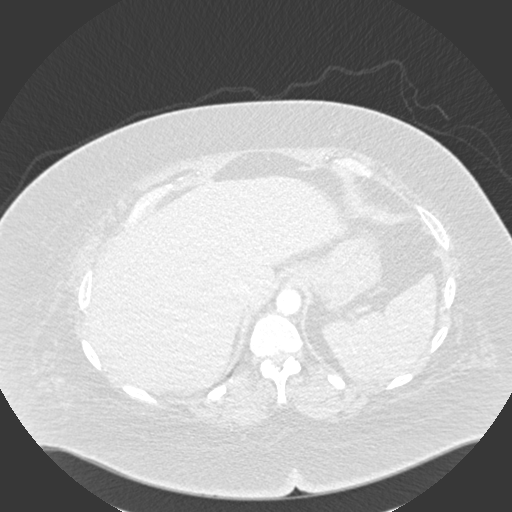
[im 115/345  soft-tissue]
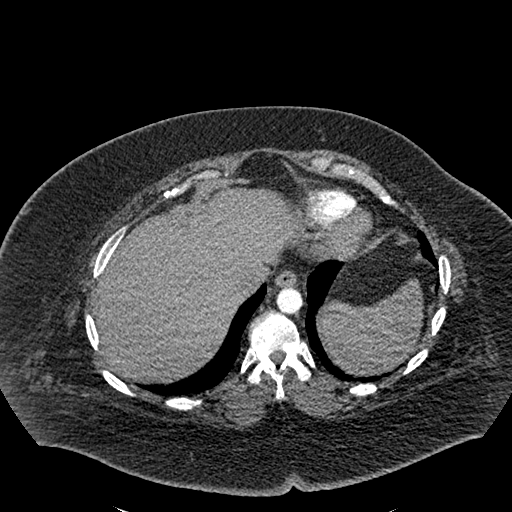
[im 134/345  lung]
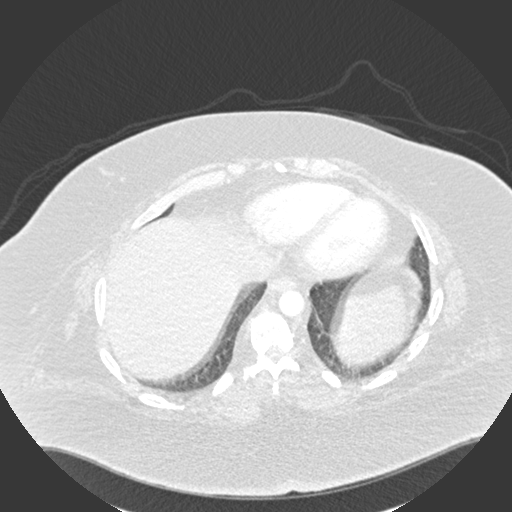
[im 153/345  soft-tissue]
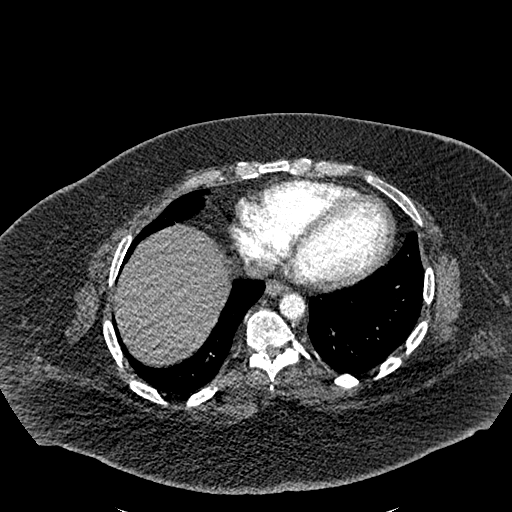
[im 192/345  lung]
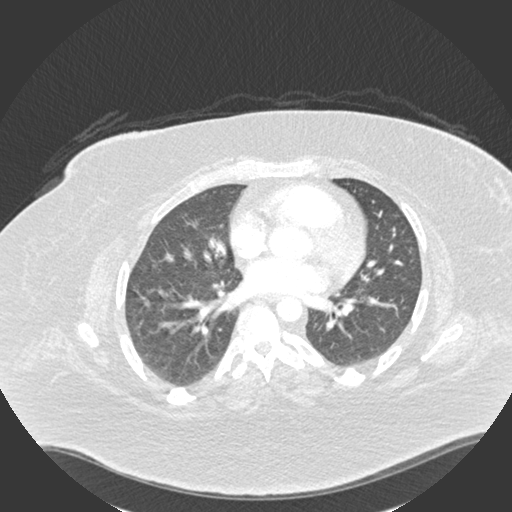
[im 211/345  soft-tissue]
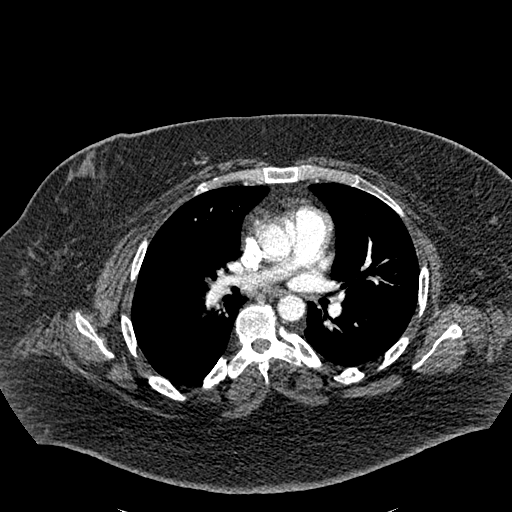
[im 230/345  lung]
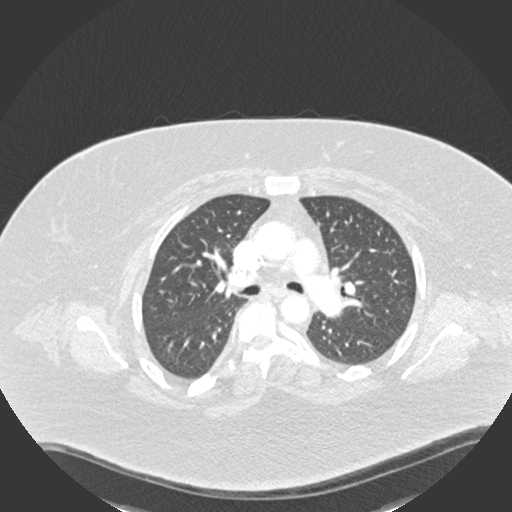
[im 249/345  soft-tissue]
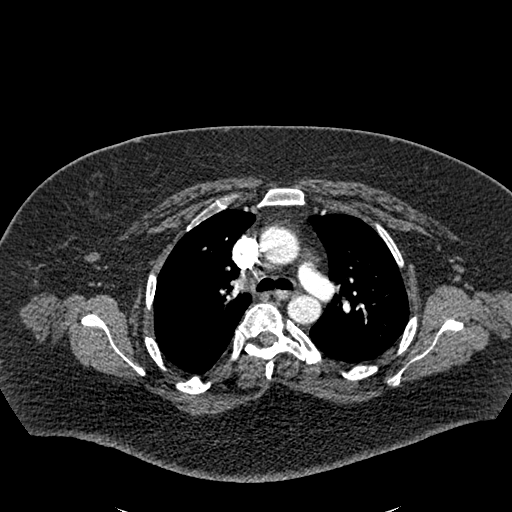
[im 268/345  lung]
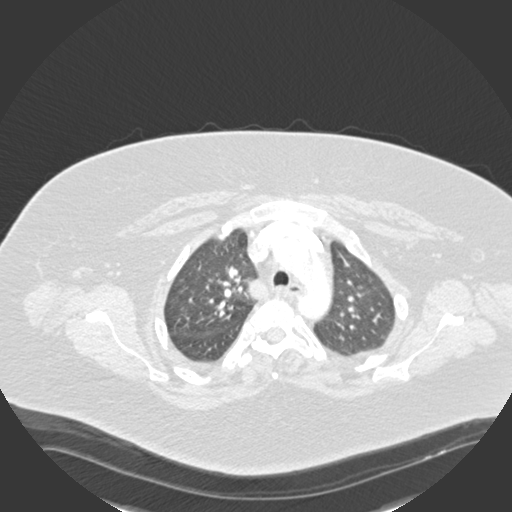
[im 287/345  soft-tissue]
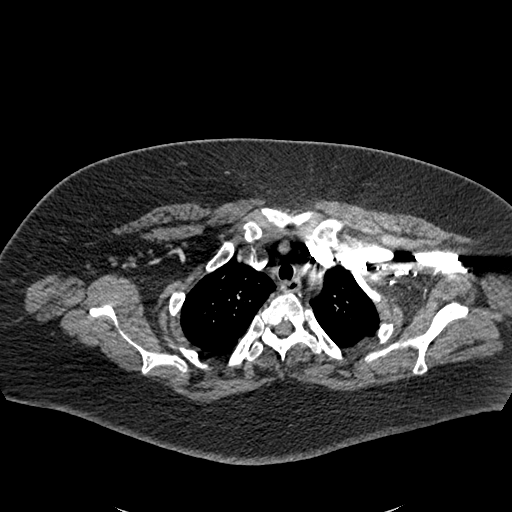
[im 306/345  lung]
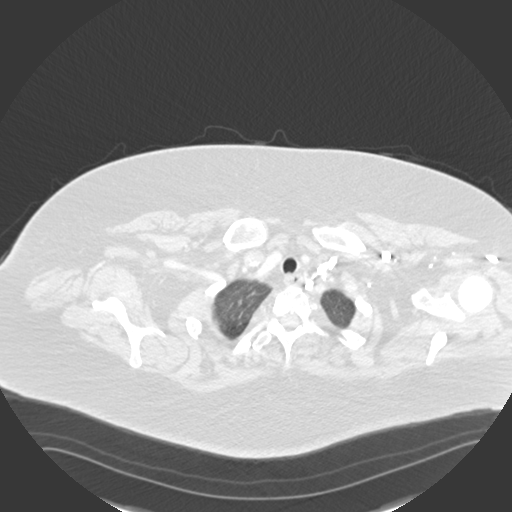
[im 325/345  soft-tissue]
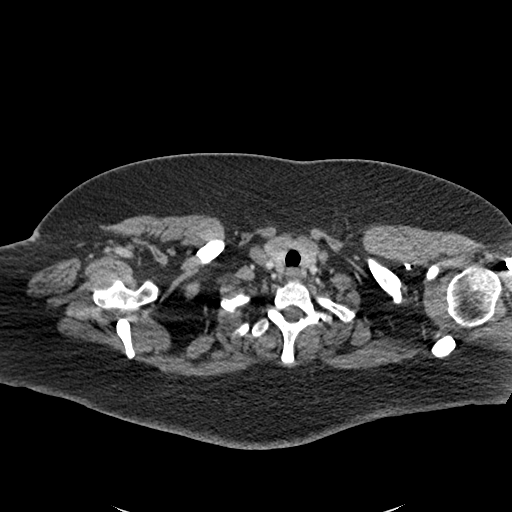

[Series 7: coronal mpr · coronal · 0.82mm/px · 2 of 78 slices shown]
[im 26/78  soft-tissue]
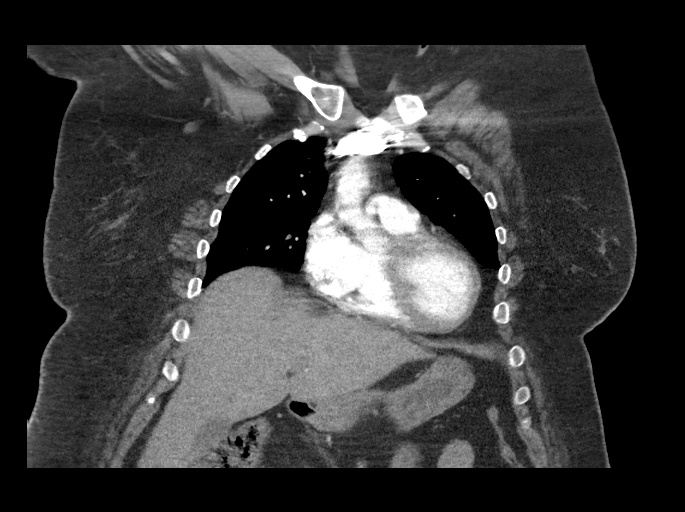
[im 52/78  soft-tissue]
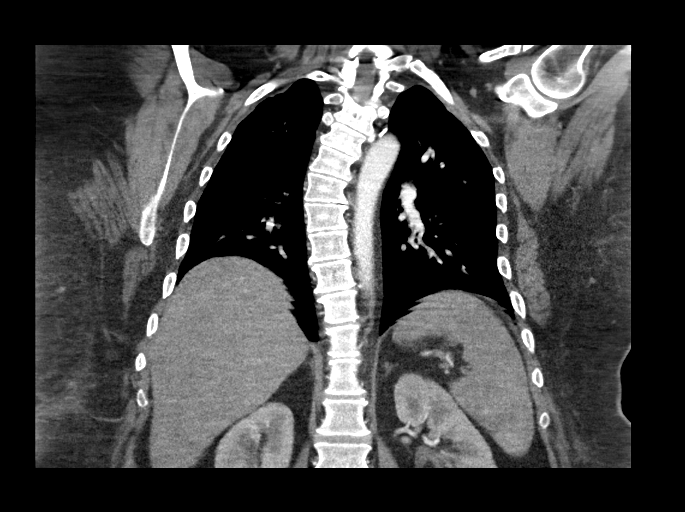

[18 of 46 positions shown; findings below may reference images not displayed]

FINDINGS: Cardiovascular: Satisfactory opacification of the pulmonary arteries
to the proximal segmental level. No evidence of pulmonary embolism.
No thoracic aortic aneurysm or dissection. Normal heart size. No
significant pericardial effusion/thickening.

Mediastinum/Nodes: No supraclavicular adenopathy. No discrete
thyroid nodule. No pathologically enlarged abdominal or pelvic lymph
nodes.

Lungs/Pleura: Evaluation of the lung bases is degraded by
respiratory motion. No focal airspace consolidation. No pleural
effusion. No pneumothorax.

Upper Abdomen: No acute abnormality.

Musculoskeletal: Dextroconvex curvature of the thoracic spine. No
acute osseous abnormality.

Review of the MIP images confirms the above findings.
IMPRESSION: No evidence of pulmonary embolism or other acute intrathoracic
process.

## 2022-01-20 ENCOUNTER — Telehealth: Payer: Self-pay | Admitting: Cardiology

## 2022-01-20 NOTE — Telephone Encounter (Signed)
Pt c/o medication issue:  1. Name of Medication:   flecainide (TAMBOCOR) 150 MG tablet    2. How are you currently taking this medication (dosage and times per day)? Stopped taking last Thursday 11/02   3. Are you having a reaction (difficulty breathing--STAT)? No  4. What is your medication issue? Patient stats that it was dropping her HR down into the 30's and 40's, so she stopped taking med. She says her HR is now staying around 55-65 now. Requesting call back to discuss this.

## 2022-01-20 NOTE — Telephone Encounter (Signed)
Spoke with pt, she reports dizziness and lightheadedness when her heart rate is low. Once she stopped the flecainide and her heart rate increased the symptoms went away. Aware will make dr tobb aware.

## 2022-01-21 ENCOUNTER — Encounter: Payer: Self-pay | Admitting: Cardiology

## 2022-01-21 MED ORDER — FLECAINIDE ACETATE 50 MG PO TABS: 50.0000 mg | ORAL_TABLET | Freq: Two times a day (BID) | ORAL | 3 refills | Status: DC

## 2022-02-24 ENCOUNTER — Telehealth: Payer: Self-pay | Admitting: Cardiology

## 2022-02-24 DIAGNOSIS — R7303 Prediabetes: Secondary | ICD-10-CM

## 2022-02-24 NOTE — Telephone Encounter (Signed)
Spoke to [facility]-advised referral must be placed yearly.   Referral placed.

## 2022-02-24 NOTE — Telephone Encounter (Signed)
New Message:     Ellen Shaw says patient need a new referral. Her old referral have expired. Please use [phone] and R73.03 codes for the referral.

## 2022-02-25 ENCOUNTER — Ambulatory Visit (INDEPENDENT_AMBULATORY_CARE_PROVIDER_SITE_OTHER): Payer: Medicare Other | Admitting: Pulmonary Disease

## 2022-02-25 ENCOUNTER — Encounter: Payer: Self-pay | Admitting: Pulmonary Disease

## 2022-02-25 VITALS — BP 118/74 | HR 61 | Ht 69.0 in | Wt 319.0 lb

## 2022-02-25 DIAGNOSIS — J9611 Chronic respiratory failure with hypoxia: Secondary | ICD-10-CM

## 2022-02-25 DIAGNOSIS — E662 Morbid (severe) obesity with alveolar hypoventilation: Secondary | ICD-10-CM | POA: Diagnosis not present

## 2022-02-25 DIAGNOSIS — J4520 Mild intermittent asthma, uncomplicated: Secondary | ICD-10-CM

## 2022-02-25 MED ORDER — ALBUTEROL SULFATE HFA 108 (90 BASE) MCG/ACT IN AERS: 2.0000 | INHALATION_SPRAY | Freq: Four times a day (QID) | RESPIRATORY_TRACT | 6 refills | Status: DC | PRN

## 2022-02-25 NOTE — Patient Instructions (Addendum)
Continue to use 1-2L to maintain Oxygen levels above 88%  Continue CPAP at night  Try albuterol inhaler as needed 1-2 puffs every 4-6 hours as needed  Good work on the weight loss, continue your exercise regimen at the gym.  Follow up in 1 year

## 2022-02-25 NOTE — Progress Notes (Signed)
Synopsis: Referred in November 2022 for chronic hypoxemic respiratory failure by Ellen GunningKate Yates, PA  Subjective:   PATIENT ID: Ellen ReichertSarah Ellen Shaw GENDER: Shaw DOB: November 22, 1980, MRN: [id number]  HPI  Chief Complaint  Patient presents with   Follow-up    F/U. States she has been doing well since last visit. Still using 2L of o2.    Ellen HornSarah Shaw is a 41 year old woman, former smoker with obesity, chronic hypoxemic respiratory failure and obstructive sleep apnea who returns to pulmonary clinic for follow up.   She has been losing weight. She is going to the gym and walking on the treadmill. She had a gallbladder surgery in October and was not eating a lot during that period of time. She does report wheezing on days with cold air like today.   She is using 1-2L of oxygen.   OV 06/04/21 She has done well since last visit. She is working with a nutritionist on weight loss but has not lost any weight since the beginning of the year.   She has an upcoming surgery scheduled with ENT for deviated septum and nasal obstruction.   She is intolerant of CPAP therapy due to ear fullness for her OSA.   OV 03/04/2021 HRCT Chest from 02/05/21 did not show any parenchymal or airway issues. PFTs today show mild restrictive defect with normal diffusion capacity. There was slight improvement in FEV1, FVC, TLC and DLCO overall compared to prior PFTs from [facility].   She continues to have exertional dyspnea. Denies cough, wheezing or chest tightness. She reports episodes of her heart rate and a decline in SpO2. She does continue to have palpations despite being on flecainide and propranolol per cardiology.   She is being evaluated by [place]NT in [place], [place] who is planning to operate on bilateral bone spurs of her nose.   She has not returned to smoking. She started going to the weight management clinic last month.  OV 01/16/21 She was started on oxygen earlier this year by her primary care team due to SpO2  readings in the mid 80s on room air. She is currently on 1-2L of supplemental oxygen. She is using CPAP therapy at night and is followed at [facility] for her sleep apnea.   The oxygen need is new since being sick with covid 19 in 11/2019 and again 07/2020.   She had pulmonary function tests performed 07/05/20 which showed FEV1 2.58L (74%), FVC 3.12L (72%), ratio 83, TLC 4.14L (71%), DLCO 30.99 (74%).  She was evaluated by Dr. Eual Finesutta of pulmonary at [facility] on 07/12/20. She was referred to pulmonary rehab at [facility] and is waiting to be enrolled in this program. They trialed her on anoro ellipta but she did not tolerate this medicine due to chest discomfort. They recommended weight loss.  She is seeing a nutritionist at this time to work on weight loss.   She quit smoking 04/2020 and smoked 1-2 packs over the past 10 years.   Past Medical History:  Diagnosis Date   Alcohol abuse 02/21/2014   Allergic rhinitis 01/31/2008   Anxiety 07/22/2016   Arthritis 07/22/2016   Knees   Asthma 01/31/2008   Bell's palsy    age [age]   Bilateral leg edema 02/15/2020   Chest discomfort 03/19/2020   Complex atypical endometrial hyperplasia 08/01/2013   Folliculitis 10/12/2017   GERD (gastroesophageal reflux disease) 12/03/2012   History of Helicobacter pylori infection 03/03/2017   Hyperlipidemia    Hypertension  Intertrigo 09/01/2017   Lumbosacral radiculopathy 09/01/2013   Major depressive disorder, recurrent episode (HCC) 05/02/2014   Medication overuse headache 07/27/2014   Migraine with aura and without status migrainosus, not intractable 07/27/2014   Follows with Dr. Weston Settle in Neurology. HX pseudotumor cerebri   Mixed stress and urge urinary incontinence 08/12/2013   Morbid obesity (HCC)    NSVT (nonsustained ventricular tachycardia) (HCC) 03/19/2020   Obstructive sleep apnea on CPAP 12/27/2010   AHI=21.8 AutoPAP at 15-20 with an EPR=3   Other specific personality  disorders (HCC)    Patellofemoral pain syndrome 10/12/2012   Posttraumatic stress disorder 02/04/2012   Prediabetes 06/30/2017   A1C 6.1 on 06/2017   Pseudotumor    PVC (premature ventricular contraction) 03/19/2020   Right bundle branch block 02/15/2020   Right cervical radiculopathy 07/18/2014   SVT (supraventricular tachycardia) 07/15/2016   Tobacco use 12/03/2011   Umbilical cyst 06/02/2011   Urinary retention 08/19/2013   S/P interstim placement with Urology   Vulvovaginal candidiasis 03/03/2017     Family History  Problem Relation Age of Onset   COPD Father    Lung cancer Father    Congestive Heart Failure Father    Hypertension Father    Kidney disease Father    Atrial fibrillation Father    Macular degeneration Father    Glaucoma Father    Stroke Father    High Cholesterol Mother    Heart attack Maternal Grandmother    Lung cancer Paternal Grandmother      Social History   Socioeconomic History   Marital status: Single    Spouse name: Not on file   Number of children: Not on file   Years of education: Not on file   Highest education level: Not on file  Occupational History   Not on file  Tobacco Use   Smoking status: Former    Packs/day: 1.00    Types: Cigarettes    Quit date: 01/19/2020    Years since quitting: 2.1   Smokeless tobacco: Never  Vaping Use   Vaping Use: Never used  Substance and Sexual Activity   Alcohol use: Not Currently   Drug use: No   Sexual activity: Not on file  Other Topics Concern   Not on file  Social History Narrative   Not on file   Social Determinants of Health   Financial Resource Strain: Not on file  Food Insecurity: Not on file  Transportation Needs: Not on file  Physical Activity: Not on file  Stress: Not on file  Social Connections: Not on file  Intimate Partner Violence: Not on file     Allergies  Allergen Reactions   Bee Venom Shortness Of Breath and Swelling   Ciprofloxacin Anaphylaxis, Shortness Of  Breath and Rash   Clindamycin Shortness Of Breath   Esomeprazole Magnesium Hives and Shortness Of Breath   Nitrofurantoin Shortness Of Breath   Oxycodone Other (See Comments)    Other: "I passed out." Other: convulsions "shaking" Other reaction(s): Dizziness, Hypotension, Wheezing (ALLERGY/intolerance)     Sulfa Antibiotics Shortness Of Breath and Rash   Trimethoprim     Other reaction(s): Rash, Rash, Shortness of breath   Citrus Hives   Doxycycline Other (See Comments)    Muscle spasms  Bad muscle spasms    Fluticasone Rash and Other (See Comments)    "Headache and nosebleed"     Penicillins Rash   Buspirone    Fentanyl Other (See Comments)    "I pass out."  *tolerated  06/11/2021 and 01/09/22 intraoperatively without complications*     Lexapro [Escitalopram]    Paliperidone Other (See Comments)    Other reaction(s): Dystonia   Pantoprazole Hives    Relieved with Benadryl     Outpatient Medications Prior to Visit  Medication Sig Dispense Refill   Cholecalciferol (VITAMIN D3) 1.25 MG (50000 UT) CAPS Take 50,000 Units by mouth once a week.     EMGALITY 120 MG/ML SOAJ Inject 120 mg into the skin every 30 (thirty) days.     EPINEPHrine 0.3 mg/0.3 mL IJ SOAJ injection Inject 0.3 mg into the muscle as needed for anaphylaxis.     FARXIGA 5 MG TABS tablet Take 5 mg by mouth every morning.     FEROSUL 325 (65 Fe) MG tablet Take 325 mg by mouth daily.     flecainide (TAMBOCOR) 50 MG tablet Take 1 tablet (50 mg total) by mouth 2 (two) times daily. 180 tablet 3   furosemide (LASIX) 40 MG tablet Take 1 tablet (40 mg total) by mouth every other day. (Patient taking differently: Take 40 mg by mouth every Tuesday, Thursday, and Saturday at 6 PM.) 45 tablet 3   linaclotide (LINZESS) 290 MCG CAPS capsule Take 290 mcg by mouth daily before breakfast.     loratadine (CLARITIN) 10 MG tablet Take 10 mg by mouth daily.     Pediatric Multiple Vitamins (FLINTSTONES MULTIVITAMIN PO) Take 2  tablets by mouth daily.     potassium chloride SA (KLOR-CON M20) 20 MEQ tablet Take 1 tablet (20 mEq total) by mouth every other day. (Patient taking differently: Take 20 mEq by mouth every Tuesday, Thursday, and Saturday at 6 PM.) 45 tablet 3   promethazine (PHENERGAN) 12.5 MG tablet Take 12.5 mg by mouth every 6 (six) hours as needed for nausea or vomiting.     risperiDONE (RISPERDAL) 3 MG tablet Take 3 mg by mouth at bedtime.     sertraline (ZOLOFT) 100 MG tablet Take 50 mg by mouth daily.     traZODone (DESYREL) 50 MG tablet Take 25 mg by mouth at bedtime as needed for sleep.     Ubrogepant (UBRELVY) 100 MG TABS Take 100 mg by mouth as needed (migraines).     vitamin B-12 (CYANOCOBALAMIN) 1000 MCG tablet Take 1,000 mcg by mouth daily.     HYDROcodone-acetaminophen (NORCO/VICODIN) 5-325 MG tablet Take 1 tablet by mouth every 6 (six) hours as needed for moderate pain. 12 tablet 0   propranolol (INDERAL) 10 MG tablet Take 1 tablet (10 mg total) by mouth at bedtime. (Patient not taking: Reported on 10/21/2021) 90 tablet 3   RISPERDAL CONSTA 25 MG injection Inject 25 mg into the muscle every 14 (fourteen) days.     No facility-administered medications prior to visit.    Review of Systems  Constitutional:  Negative for chills, fever, malaise/fatigue and weight loss.  HENT:  Negative for congestion, sinus pain and sore throat.   Eyes: Negative.   Respiratory:  Positive for shortness of breath. Negative for cough, hemoptysis, sputum production and wheezing.   Cardiovascular:  Negative for chest pain, palpitations, orthopnea, claudication and leg swelling.  Gastrointestinal:  Negative for abdominal pain, heartburn, nausea and vomiting.  Genitourinary: Negative.   Musculoskeletal:  Negative for joint pain and myalgias.  Skin:  Negative for rash.  Neurological:  Negative for weakness.  Endo/Heme/Allergies: Negative.   Psychiatric/Behavioral: Negative.      Objective:   Vitals:   02/25/22 1302   BP: 118/74  Pulse: 61  SpO2: 100%  Weight: (!) 319 lb (144.7 kg)  Height:  (1.753 m)   Physical Exam Constitutional:      General: She is not in acute distress.    Appearance: She is obese. She is not ill-appearing.  HENT:     Head: Normocephalic and atraumatic.  Eyes:     General: No scleral icterus. Cardiovascular:     Rate and Rhythm: Normal rate and regular rhythm.     Pulses: Normal pulses.     Heart sounds: Normal heart sounds. No murmur heard. Pulmonary:     Effort: Pulmonary effort is normal.     Breath sounds: Decreased breath sounds present. No wheezing, rhonchi or rales.  Musculoskeletal:     Right lower leg: No edema.     Left lower leg: No edema.  Skin:    General: Skin is warm and dry.  Neurological:     General: No focal deficit present.     Mental Status: She is alert.  Psychiatric:        Mood and Affect: Mood normal.        Behavior: Behavior normal.        Thought Content: Thought content normal.        Judgment: Judgment normal.    CBC    Component Value Date/Time   WBC 13.0 (H) 12/26/2021 1300   RBC 4.52 12/26/2021 1300   HGB 13.4 12/26/2021 1300   HCT 41.5 12/26/2021 1300   PLT 307 12/26/2021 1300   MCV 91.8 12/26/2021 1300   MCH 29.6 12/26/2021 1300   MCHC 32.3 12/26/2021 1300   RDW 13.3 12/26/2021 1300   LYMPHSABS 2.8 08/02/2010 1501   MONOABS 0.7 08/02/2010 1501   EOSABS 0.4 08/02/2010 1501   BASOSABS 0.0 08/02/2010 1501      Latest Ref Rng & Units 12/26/2021    1:00 PM 07/04/2021   11:28 AM 02/28/2021   11:35 AM  BMP  Glucose 70 - 99 mg/dL 161  096  85   BUN 6 - 20 mg/dL Creatinine 0.44 - 1.00 mg/dL 0.45  4.09  8.11   BUN/Creat Ratio 9 - Sodium 135 - 145 mmol/L 137  139  139   Potassium 3.5 - 5.1 mmol/L 3.8  3.8  4.7   Chloride 98 - 111 mmol/L 103  98  100   CO2 22 - 32 mmol/L Calcium 8.9 - 10.3 mg/dL 8.8  9.2  9.9    Chest imaging: CTA Chest 03/01/21 No evidence of pulmonary  embolism or other acute intrathoracic process.  HRCT Chest 02/05/21 1. No findings to suggest interstitial lung disease. No acute findings in the thorax to account for the patient's symptoms.  CXR 07/02/20 The lungs are clear without focal pneumonia, edema, pneumothorax or pleural effusion. The cardiopericardial silhouette is within normal limits for size. Thoracolumbar scoliosis.   CTA Chest 12/30/19 1. No demonstrable pulmonary embolus. No thoracic aortic aneurysm or dissection. 2. Minimal left base atelectasis. Lungs otherwise clear. 3. No evident thoracic adenopathy.   PFT:    Latest Ref Rng & Units 03/04/2021   10:58 AM  PFT Results  FVC-Pre L 3.08   FVC-Predicted Pre % 71   FVC-Post L 3.22   FVC-Predicted Post % 74   Pre FEV1/FVC % % 83   Post FEV1/FCV % % 84   FEV1-Pre L 2.56   FEV1-Predicted  Pre % 73   FEV1-Post L 2.69   DLCO uncorrected ml/min/mmHg 20.62   DLCO UNC% % 80   DLCO corrected ml/min/mmHg 20.19   DLCO COR %Predicted % 78   DLVA Predicted % 103   TLC L 4.54   TLC % Predicted % 78   RV % Predicted % 82     Labs:  Path:  Echo 03/13/2020: LV ef 60-65%. LV diastolic parameters are normal. RV systolic function is normal. RV size is normal.   Heart Catheterization:  Assessment & Plan:   Chronic hypoxemic respiratory failure (HCC)  Obesity hypoventilation syndrome (HCC)  Mild intermittent reactive airway disease without complication - Plan: albuterol (VENTOLIN HFA) 108 (90 Base) MCG/ACT inhaler  Discussion: Darrelyn Morro is a 41 year old woman, former smoker with obesity, chronic hypoxemic respiratory failure and obstructive sleep apnea who returns to pulmonary clinic for follow up.   She does not have parenchymal involvement from covid 19 infections as HRCT chest from 01/2021 does not show post-covid findings or other interstitial findings.   Recent PFTs are improved from 06/2020 but she does have a mild restrictive defect which could be related  to obesity.   She likely has obesity hypoventilation syndrome which could be leading to her O2 needs. She is to continue to work on weight loss. She is to continue CPAP for her OSA.   She is to use albuterol inhaler as needed for wheezing.   Follow up in 1 year.  Melody Comas, MD [facility] Office: [phone]    Current Outpatient Medications:    albuterol (VENTOLIN HFA) 108 (90 Base) MCG/ACT inhaler, Inhale 2 puffs into the lungs every 6 (six) hours as needed for wheezing or shortness of breath., Disp: 8 g, Rfl: 6   Cholecalciferol (VITAMIN D3) 1.25 MG (50000 UT) CAPS, Take 50,000 Units by mouth once a week., Disp: , Rfl:    EMGALITY 120 MG/ML SOAJ, Inject 120 mg into the skin every 30 (thirty) days., Disp: , Rfl:    EPINEPHrine 0.3 mg/0.3 mL IJ SOAJ injection, Inject 0.3 mg into the muscle as needed for anaphylaxis., Disp: , Rfl:    FARXIGA 5 MG TABS tablet, Take 5 mg by mouth every morning., Disp: , Rfl:    FEROSUL 325 (65 Fe) MG tablet, Take 325 mg by mouth daily., Disp: , Rfl:    flecainide (TAMBOCOR) 50 MG tablet, Take 1 tablet (50 mg total) by mouth 2 (two) times daily., Disp: 180 tablet, Rfl: 3   furosemide (LASIX) 40 MG tablet, Take 1 tablet (40 mg total) by mouth every other day. (Patient taking differently: Take 40 mg by mouth every Tuesday, Thursday, and Saturday at 6 PM.), Disp: 45 tablet, Rfl: 3   linaclotide (LINZESS) 290 MCG CAPS capsule, Take 290 mcg by mouth daily before breakfast., Disp: , Rfl:    loratadine (CLARITIN) 10 MG tablet, Take 10 mg by mouth daily., Disp: , Rfl:    Pediatric Multiple Vitamins (FLINTSTONES MULTIVITAMIN PO), Take 2 tablets by mouth daily., Disp: , Rfl:    potassium chloride SA (KLOR-CON M20) 20 MEQ tablet, Take 1 tablet (20 mEq total) by mouth every other day. (Patient taking differently: Take 20 mEq by mouth every Tuesday, Thursday, and Saturday at 6 PM.), Disp: 45 tablet, Rfl: 3   promethazine (PHENERGAN) 12.5 MG  tablet, Take 12.5 mg by mouth every 6 (six) hours as needed for nausea or vomiting., Disp: , Rfl:    risperiDONE (RISPERDAL) 3 MG tablet,  Take 3 mg by mouth at bedtime., Disp: , Rfl:    sertraline (ZOLOFT) 100 MG tablet, Take 50 mg by mouth daily., Disp: , Rfl:    traZODone (DESYREL) 50 MG tablet, Take 25 mg by mouth at bedtime as needed for sleep., Disp: , Rfl:    Ubrogepant (UBRELVY) 100 MG TABS, Take 100 mg by mouth as needed (migraines)., Disp: , Rfl:    vitamin B-12 (CYANOCOBALAMIN) 1000 MCG tablet, Take 1,000 mcg by mouth daily., Disp: , Rfl:

## 2022-03-12 ENCOUNTER — Ambulatory Visit: Payer: Medicare Other | Admitting: Skilled Nursing Facility1

## 2022-03-18 ENCOUNTER — Encounter: Payer: Self-pay | Admitting: Cardiology

## 2022-03-18 DIAGNOSIS — Z79899 Other long term (current) drug therapy: Secondary | ICD-10-CM

## 2022-03-19 NOTE — Telephone Encounter (Signed)
Patient stated she had palpitations last night, which was about 1 month from the last episode. She stated her heart rate was in the 30s-40s during the palpitations. Explained to patient the pulse oximeter may not have picked up all of her heart beats. She reported having dizziness and SOB during the episode that lasted 7-8 minutes. She is taking all meds as prescribed. Patient is staying hydrated, avoiding caffeine, ETOH, and chocolate. BP his AM 114/83, P 66. While on phone, BP 140/87, P 75. Patient stated her BP is never that high. She checks it every day. Please advise on palpitations.

## 2022-03-20 ENCOUNTER — Other Ambulatory Visit: Payer: Self-pay

## 2022-03-20 DIAGNOSIS — Z79899 Other long term (current) drug therapy: Secondary | ICD-10-CM

## 2022-04-15 ENCOUNTER — Ambulatory Visit: Payer: Medicare Other | Admitting: Skilled Nursing Facility1

## 2022-04-23 ENCOUNTER — Encounter: Payer: Self-pay | Admitting: Cardiology

## 2022-04-28 ENCOUNTER — Encounter: Payer: Self-pay | Admitting: Cardiology

## 2022-04-28 ENCOUNTER — Ambulatory Visit: Payer: Medicare Other | Attending: Cardiology | Admitting: Cardiology

## 2022-04-28 VITALS — BP 124/80 | HR 74 | Ht 69.0 in | Wt 321.6 lb

## 2022-04-28 DIAGNOSIS — I10 Essential (primary) hypertension: Secondary | ICD-10-CM

## 2022-04-28 DIAGNOSIS — I493 Ventricular premature depolarization: Secondary | ICD-10-CM

## 2022-04-28 DIAGNOSIS — I471 Supraventricular tachycardia, unspecified: Secondary | ICD-10-CM

## 2022-04-28 DIAGNOSIS — I4729 Other ventricular tachycardia: Secondary | ICD-10-CM

## 2022-04-28 DIAGNOSIS — G4733 Obstructive sleep apnea (adult) (pediatric): Secondary | ICD-10-CM

## 2022-04-28 MED ORDER — FLECAINIDE ACETATE 50 MG PO TABS: 50.0000 mg | ORAL_TABLET | Freq: Two times a day (BID) | ORAL | 3 refills | Status: DC

## 2022-04-28 MED ORDER — PROPRANOLOL HCL 10 MG PO TABS: 10.0000 mg | ORAL_TABLET | Freq: Every evening | ORAL | 3 refills | Status: DC

## 2022-04-28 NOTE — Patient Instructions (Addendum)
Medication Instructions:  Your physician has recommended you make the following change in your medication:  START: Propranolol 10 mg nightly *If you need a refill on your cardiac medications before your next appointment, please call your pharmacy*   Lab Work: None If you have labs (blood work) drawn today and your tests are completely normal, you will receive your results only by: [place] (if you have MyChart) OR A paper copy in the mail If you have any lab test that is abnormal or we need to change your treatment, we will call you to review the results.   Testing/Procedures: None   Follow-Up: At [facility], you and your health needs are our priority.  As part of our continuing mission to provide you with exceptional heart care, we have created designated Provider Care Teams.  These Care Teams include your primary Cardiologist (physician) and Advanced Practice Providers (APPs -  Physician Assistants and Nurse Practitioners) who all work together to provide you with the care you need, when you need it.  We recommend signing up for the patient portal called "MyChart".  Sign up information is provided on this After Visit Summary.  MyChart is used to connect with patients for Virtual Visits (Telemedicine).  Patients are able to view lab/test results, encounter notes, upcoming appointments, etc.  Non-urgent messages can be sent to your provider as well.   To learn more about what you can do with MyChart, go to [email or web address].    Your next appointment:   4 month(s)  Provider:   Berniece Salines, DO    Other instructions: [email or web address] Steps to Quit Smoking Smoking tobacco is the leading cause of preventable death. It can affect almost every organ in the body. Smoking puts you and people around you at risk for many serious, long-lasting (chronic) diseases. Quitting smoking can be hard, but it is one of the best things that you can do for your health. It is never too  late to quit. Do not give up if you cannot quit the first time. Some people need to try many times to quit. Do your best to stick to your quit plan, and talk with your doctor if you have any questions or concerns. How do I get ready to quit? Pick a date to quit. Set a date within the next 2 weeks to give you time to prepare. Write down the reasons why you are quitting. Keep this list in places where you will see it often. Tell your family, friends, and co-workers that you are quitting. Their support is important. Talk with your doctor about the choices that may help you quit. Find out if your health insurance will pay for these treatments. Know the people, places, things, and activities that make you want to smoke (triggers). Avoid them. What first steps can I take to quit smoking? Throw away all cigarettes at home, at work, and in your car. Throw away the things that you use when you smoke, such as ashtrays and lighters. Clean your car. Empty the ashtray. Clean your home, including curtains and carpets. What can I do to help me quit smoking? Talk with your doctor about taking medicines and seeing a counselor. You are more likely to succeed when you do both. If you are pregnant or breastfeeding: Talk with your doctor about counseling or other ways to quit smoking. Do not take medicine to help you quit smoking unless your doctor tells you to. Quit right away Quit smoking completely, instead of slowly cutting  back on how much you smoke over a period of time. Stopping smoking right away may be more successful than slowly quitting. Go to counseling. In-person is best if this is an option. You are more likely to quit if you go to counseling sessions regularly. Take medicine You may take medicines to help you quit. Some medicines need a prescription, and some you can buy over-the-counter. Some medicines may contain a drug called nicotine to replace the nicotine in cigarettes. Medicines may: Help  you stop having the desire to smoke (cravings). Help to stop the problems that come when you stop smoking (withdrawal symptoms). Your doctor may ask you to use: Nicotine patches, gum, or lozenges. Nicotine inhalers or sprays. Non-nicotine medicine that you take by mouth. Find resources Find resources and other ways to help you quit smoking and remain smoke-free after you quit. They include: Online chats with a Social worker. Phone quitlines. Printed Furniture conservator/restorer. Support groups or group counseling. Text messaging programs. Mobile phone apps. Use apps on your mobile phone or tablet that can help you stick to your quit plan. Examples of free services include Quit Guide from the CDC and smokefree.gov  What can I do to make it easier to quit?  Talk to your family and friends. Ask them to support and encourage you. Call a phone quitline, such as 1-800-QUIT-NOW, reach out to support groups, or work with a Social worker. Ask people who smoke to not smoke around you. Avoid places that make you want to smoke, such as: Bars. Parties. Smoke-break areas at work. Spend time with people who do not smoke. Lower the stress in your life. Stress can make you want to smoke. Try these things to lower stress: Getting regular exercise. Doing deep-breathing exercises. Doing yoga. Meditating. What benefits will I see if I quit smoking? Over time, you may have: A better sense of smell and taste. Less coughing and sore throat. A slower heart rate. Lower blood pressure. Clearer skin. Better breathing. Fewer sick days. Summary Quitting smoking can be hard, but it is one of the best things that you can do for your health. Do not give up if you cannot quit the first time. Some people need to try many times to quit. When you decide to quit smoking, make a plan to help you succeed. Quit smoking right away, not slowly over a period of time. When you start quitting, get help and support to keep you  smoke-free. This information is not intended to replace advice given to you by your health care provider. Make sure you discuss any questions you have with your health care provider. Document Revised: 02/22/2021 Document Reviewed: 02/22/2021 Elsevier Patient Education  [employer].

## 2022-05-01 NOTE — Progress Notes (Signed)
Cardiology Office Note:    Date:  05/01/2022   ID:  Ellen Shaw, DOB 10/18/1980, MRN [id number]  PCP:  Marga Hoots, NP  Cardiologist:  Berniece Salines, DO  Electrophysiologist:  None   Referring MD: Carvel Getting Key, *   " I am doing well"  History of Present Illness:    Ellen Shaw is a 42 y.o. female with a hx of chronic respiratory failure now on oxygen managed by her primary care doctor, hypertension, morbid obesity, nonsustained ventricular tachycardia on the monitor, recurrent palpitations here today for follow-up visit.   I saw the patient 1 March 20, 2019 at that time she had some chest pain and based on her I did have some multiple episodes of nonsustained ventricular tachycardia I recommended she undergo a coronary CTA to rule out coronary artery disease.   I saw the patient on April 12, 2019 we discussed her CT results which showed 0 calcium with no evidence of coronary artery disease.  She did tell me that her palpitations were not being helped by the Cardizem.  We stopped the Cardizem and start the patient on propanolol 20 mg at nighttime and flecainide 50 mg twice a day.     I saw the patient on October 15, 2020 at that time she had she started oxygen.  She was doing well with this.  During that time we restarted her propanolol 10 mg daily due to low blood pressure and we kept her on the flecainide twice a day.   I saw the patient on January 07, 2021 at that time she was doing well she has some leg swelling and her palpitations have improved significantly.   In the interim the patient was doing well.  But has been following with the cardio Pearlie Oyster who has been treating her for possible bulging disks.  The chiropractor recommended that she go to the emergency department initially on February 22, 2021 at which time she was experiencing left upper back pain spasm as well as left arm weakness.  At that time she described her muscle spasms that he was on and  off which started back in November.  During that visit all of her work-up was negative she had a lumbar spine x-ray which showed evidence of moderate this changes at L5-S1 as well as L4-L5.  There was also evidence of chronic lumbar hyperlordosis.  At that time she also had a shoulder x-ray which showed all of her joint spaces were maintained.   At her visit on February 28, 2021 she was status post hospitalization where she was noted to be in SVT.  At that visit she did have some palpitations.  She was having some pleuritic chest pain as sent the patient for CTA to rule out pulmonary embolism.  She did get the imaging there is no evidence of pulmonary embolism.  I saw the patient on July 04, 2021 at that time she was status post a hospitalization in May for [facility].  At her last visit I increased her Lasix to 40 mg every other day because she was asked with some leg swelling.  Today she is here for follow-up visit.  She is doing well she tells me.  She has not had any hospitalizations since I last saw the patient.  I am very happy for her.  Past Medical History:  Diagnosis Date   Alcohol abuse 02/21/2014   Allergic rhinitis 01/31/2008   Anxiety 07/22/2016   Arthritis 07/22/2016  Knees   Asthma 01/31/2008   Bell's palsy    age 55-14   Bilateral leg edema 02/15/2020   Chest discomfort 03/19/2020   Complex atypical endometrial hyperplasia [id number]   Folliculitis 0000000   GERD (gastroesophageal reflux disease) 99991111   History of Helicobacter pylori infection 03/03/2017   Hyperlipidemia    Hypertension    Intertrigo 09/01/2017   Lumbosacral radiculopathy 09/01/2013   Major depressive disorder, recurrent episode ([place]) 05/02/2014   Medication overuse headache 07/27/2014   Migraine with aura and without status migrainosus, not intractable 07/27/2014   Follows with Dr. Cristela Blue in Neurology. 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[place])    Patellofemoral pain syndrome 10/12/2012   Posttraumatic stress disorder 02/04/2012   Prediabetes 06/30/2017   A1C 6.1 on 06/2017   Pseudotumor    PVC (premature ventricular contraction) 03/19/2020   Right bundle branch block 02/15/2020   Right cervical radiculopathy 07/18/2014   SVT (supraventricular tachycardia) 07/15/2016   Tobacco use [id number]   Umbilical cyst 0000000   Urinary retention 08/19/2013   S/P interstim placement with Urology   Vulvovaginal candidiasis 03/03/2017    Past Surgical History:  Procedure Laterality Date   BLADDER SURGERY     CHOLECYSTECTOMY N/A 01/09/2022   Procedure: LAPAROSCOPIC CHOLECYSTECTOMY;  Surgeon: Mickeal Skinner, MD;  Location: [facility];  Service: General;  Laterality: N/A;   EYELID LACERATION REPAIR Bilateral    NASAL SEPTUM SURGERY     TONSILLECTOMY AND ADENOIDECTOMY     VAGINAL HYSTERECTOMY  2015    Current Medications: Current Meds  Medication Sig   albuterol (VENTOLIN HFA) 108 (90 Base) MCG/ACT inhaler Inhale 2 puffs into the lungs every 6 (six) hours as needed for wheezing or shortness of breath.   atorvastatin (LIPITOR) 10 MG tablet    Cholecalciferol (VITAMIN D3) 1.25 MG (50000 UT) CAPS Take 50,000 Units by mouth once a week.   EPINEPHrine 0.3 mg/0.3 mL IJ SOAJ injection Inject 0.3 mg into the muscle as needed for anaphylaxis.   FARXIGA 5 MG TABS tablet Take 5 mg by mouth every morning.   FEROSUL 325 (65 Fe) MG tablet Take 325 mg by mouth daily.   furosemide (LASIX) 40 MG tablet Take 1 tablet (40 mg total) by mouth every other day. (Patient taking differently: Take 40 mg by mouth every Tuesday, Thursday, and Saturday at 6 PM.)   linaclotide (LINZESS) 290 MCG CAPS capsule Take 290 mcg by mouth daily as  needed.   loratadine (CLARITIN) 10 MG tablet Take 10 mg by mouth daily as needed for allergies.   Pediatric Multiple Vitamins (FLINTSTONES MULTIVITAMIN PO) Take 2 tablets by mouth daily.   potassium chloride SA (KLOR-CON M20) 20 MEQ tablet Take 1 tablet (20 mEq total) by mouth every other day. (Patient taking differently: Take 20 mEq by mouth every Tuesday, Thursday, and Saturday at 6 PM.)   promethazine (PHENERGAN) 12.5 MG tablet Take 12.5 mg by mouth every 6 (six) hours as needed for nausea or vomiting.   propranolol (INDERAL) 10 MG tablet Take 1 tablet (10 mg total) by mouth at bedtime.   risperiDONE (RISPERDAL) 3 MG tablet Take 3 mg by mouth at bedtime.   Ubrogepant (UBRELVY) 100 MG TABS Take 100 mg by mouth as needed (  migraines).   vitamin B-12 (CYANOCOBALAMIN) 1000 MCG tablet Take 1,000 mcg by mouth daily.   [DISCONTINUED] flecainide (TAMBOCOR) 50 MG tablet Take 1 tablet (50 mg total) by mouth 2 (two) times daily.     Allergies:   Bee venom, Ciprofloxacin, Clindamycin, Esomeprazole magnesium, Nitrofurantoin, Oxycodone, Sulfa antibiotics, Trimethoprim, Citrus, Doxycycline, Fluticasone, Penicillins, Buspirone, Fentanyl, Lexapro [escitalopram], Paliperidone, and Pantoprazole   Social History   Socioeconomic History   Marital status: Single    Spouse name: Not on file   Number of children: Not on file   Years of education: Not on file   Highest education level: Not on file  Occupational History   Not on file  Tobacco Use   Smoking status: Former    Packs/day: 1.00    Types: Cigarettes    Quit date: 01/19/2020    Years since quitting: 2.2   Smokeless tobacco: Never  Vaping Use   Vaping Use: Never used  Substance and Sexual Activity   Alcohol use: Not Currently   Drug use: No   Sexual activity: Not on file  Other Topics Concern   Not on file  Social History Narrative   Not on file   Social Determinants of Health   Financial Resource Strain: Not on file  Food Insecurity:  Not on file  Transportation Needs: Not on file  Physical Activity: Not on file  Stress: Not on file  Social Connections: Not on file     Family History: The patient's family history includes Atrial fibrillation in her father; COPD in her father; Congestive Heart Failure in her father; Glaucoma in her father; Heart attack in her maternal grandmother; High Cholesterol in her mother; Hypertension in her father; Kidney disease in her father; Lung cancer in her father and paternal grandmother; Macular degeneration in her father; Stroke in her father.  ROS:   Review of Systems  Constitution: Negative for decreased appetite, fever and weight gain.  HENT: Negative for congestion, ear discharge, hoarse voice and sore throat.   Eyes: Negative for discharge, redness, vision loss in right eye and visual halos.  Cardiovascular: Negative for chest pain, dyspnea on exertion, leg swelling, orthopnea and palpitations.  Respiratory: Negative for cough, hemoptysis, shortness of breath and snoring.   Endocrine: Negative for heat intolerance and polyphagia.  Hematologic/Lymphatic: Negative for bleeding problem. Does not bruise/bleed easily.  Skin: Negative for flushing, nail changes, rash and suspicious lesions.  Musculoskeletal: Negative for arthritis, joint pain, muscle cramps, myalgias, neck pain and stiffness.  Gastrointestinal: Negative for abdominal pain, bowel incontinence, diarrhea and excessive appetite.  Genitourinary: Negative for decreased libido, genital sores and incomplete emptying.  Neurological: Negative for brief paralysis, focal weakness, headaches and loss of balance.  Psychiatric/Behavioral: Negative for altered mental status, depression and suicidal ideas.  Allergic/Immunologic: Negative for HIV exposure and persistent infections.    EKGs/Labs/Other Studies Reviewed:    The following studies were reviewed today:   EKG:  None today   CCTA  Aorta: Normal size.  No calcifications.   No dissection.   Aortic Valve:  Trileaflet.  No calcifications.   Coronary Arteries:  Normal coronary origin.  Right dominance.   RCA is a large dominant artery that gives rise to PDA and PLVB. There is no plaque.   Left main is a large artery that gives rise to LAD and LCX arteries.   LAD is a large vessel that has no plaque.   LCX is a non-dominant artery that gives rise to one large OM1 branch. There  is no plaque.   Other findings:   Normal pulmonary vein drainage into the left atrium.   Normal left atrial appendage without a thrombus.   Normal size of the pulmonary artery.   IMPRESSION: 1. Coronary calcium score of 0. This was 0 percentile for age and sex matched control.   2. Normal coronary origin with right dominance.   3. No evidence of CAD.     Zio monitor The patient wore the monitor for 14 days starting February 15, 2020   . Indication: Palpitations   The minimum heart rate was 47 bpm, maximum heart rate was 240 bpm, and average heart rate was 85 bpm. Predominant underlying rhythm was Sinus Rhythm.  First-degree AV block was present.   9 Ventricular Tachycardia runs occurred, the run with the fastest interval lasting 5 beats with a maximum rate of 240 bpm, the longest lasting 5 beats with an average rate of 184 bpm.   Premature atrial complexes were rare less than 1%. Premature Ventricular complexes rare less than 1%.   No pauses, No AV block and no atrial fibrillation present. 14 patient triggered events and 13 diary events associated with premature ventricular complex.   Conclusion: This study is remarkable for the following:                             1.  Nonsustained ventricular tachycardia                             2.  Rare symptomatic premature ventricular complex.   Transthoracic Echocardiogram IMPRESSIONS  1. Left ventricular ejection fraction, by estimation, is 60 to 65%. The left ventricle has normal function. The left ventricle has no  regional wall motion abnormalities. Left ventricular diastolic parameters were normal.   2. Right ventricular systolic function is normal. The right ventricular size is normal.   3. The mitral valve is normal in structure. No evidence of mitral valve regurgitation. No evidence of mitral stenosis.   4. The aortic valve is normal in structure. Aortic valve regurgitation is not visualized. No aortic stenosis is present.   5. The inferior vena cava is normal in size with greater than 50% respiratory variability, suggesting right atrial pressure of 3 mmHg.   FINDINGS   Left Ventricle: Left ventricular ejection fraction, by estimation, is 60 to 65%. The left ventricle has normal function. The left ventricle has no regional wall motion abnormalities. Definity contrast agent was given IV to delineate the left ventricular   endocardial borders. The left ventricular internal cavity size was normal in size. There is no left ventricular hypertrophy. Left ventricular diastolic parameters were normal.   Right Ventricle: The right ventricular size is normal. No increase in right ventricular wall thickness. Right ventricular systolic function is  normal.   Left Atrium: Left atrial size was normal in size.   Right Atrium: Right atrial size was normal in size.   Pericardium: There is no evidence of pericardial effusion.   Mitral Valve: The mitral valve is normal in structure. No evidence of  mitral valve regurgitation. No evidence of mitral valve stenosis.   Tricuspid Valve: The tricuspid valve is normal in structure. Tricuspid  valve regurgitation is trivial. No evidence of tricuspid stenosis.   Aortic Valve: The aortic valve is normal in structure. Aortic valve  regurgitation is not visualized. No aortic stenosis is present.   Pulmonic Valve: The  pulmonic valve was normal in structure. Pulmonic valve  regurgitation is not visualized. No evidence of pulmonic stenosis.   Aorta: The aortic root is normal  in size and structure.   Venous: The inferior vena cava is normal in size with greater than 50%  respiratory variability, suggesting right atrial pressure of 3 mmHg.   IAS/Shunts: No atrial level shunt detected by color flow Doppler.     ZIO monitor 2022 Patch Wear Time:  13 days and 14 hours starting November 12, 2020. Indication palpitation   Patient had a min HR of 51 bpm, max HR of 158 bpm, and avg HR of 78 bpm. Predominant underlying rhythm was Sinus Rhythm. First Degree AV Block was present.    8 Supraventricular Tachycardia runs occurred, the run with the fastest interval lasting 5 beats with a max rate of 158 bpm (avg 128 bpm); the run with the fastest interval was  also the longest. Isolated SVEs were rare (<1.0%), SVE Couplets were rare (<1.0%), and SVE Triplets were rare (<1.0%). Isolated VEs were rare (<1.0%), VE Couplets were rare (<1.0%), and no VE Triplets were present. Ventricular Bigeminy and Trigeminy  were present.   Symptoms were associated with sinus rhythm and PVC.   No atrial fibrillation.   Conclusion: This study is remarkable for paroxysmal supraventricular tachycardia    Recent Labs: 07/04/2021: Magnesium 1.9 12/26/2021: BUN 7; Creatinine, Ser 0.52; Hemoglobin 13.4; Platelets 307; Potassium 3.8; Sodium 137  Recent Lipid Panel No results found for: "CHOL", "TRIG", "HDL", "CHOLHDL", "VLDL", "LDLCALC", "LDLDIRECT"  Physical Exam:    VS:  BP 124/80   Pulse 74   Ht 5' 9"$  (1.753 m)   Wt (!) 145.9 kg   SpO2 95%   BMI 47.49 kg/m     Wt Readings from Last 3 Encounters:  04/28/22 (!) 145.9 kg  02/25/22 (!) 144.7 kg  01/09/22 (!) 145.6 kg     GEN: Well nourished, well developed in no acute distress HEENT: Normal NECK: No JVD; No carotid bruits LYMPHATICS: No lymphadenopathy CARDIAC: S1S2 noted,RRR, no murmurs, rubs, gallops RESPIRATORY:  Clear to auscultation without rales, wheezing or rhonchi  ABDOMEN: Soft, non-tender, non-distended, +bowel sounds,  no guarding. EXTREMITIES: No edema, No cyanosis, no clubbing MUSCULOSKELETAL:  No deformity  SKIN: Warm and dry NEUROLOGIC:  Alert and oriented x 3, non-focal PSYCHIATRIC:  Normal affect, good insight  ASSESSMENT:    1. Essential hypertension   2. SVT (supraventricular tachycardia)   3. NSVT (nonsustained ventricular tachycardia) ([place])   4. PVC (premature ventricular contraction)   5. Obstructive sleep apnea on CPAP    PLAN:    She is doing well from a CV standpoint.  Will continue her current medication regimen.  The patient understands the need to lose weight with diet and exercise. We have discussed specific strategies for this.  Continued CPAP use.  Blood pressure is acceptable, continue with current antihypertensive regimen.  The patient is in agreement with the above plan. The patient left the office in stable condition.  The patient will follow up in 6 months   Medication Adjustments/Labs and Tests Ordered: Current medicines are reviewed at length with the patient today.  Concerns regarding medicines are outlined above.  Orders Placed This Encounter  Procedures   EKG 12-Lead   Meds ordered this encounter  Medications   flecainide (TAMBOCOR) 50 MG tablet    Sig: Take 1 tablet (50 mg total) by mouth 2 (two) times daily.    Dispense:  180 tablet  Refill:  3   propranolol (INDERAL) 10 MG tablet    Sig: Take 1 tablet (10 mg total) by mouth at bedtime.    Dispense:  90 tablet    Refill:  3    Patient Instructions  Medication Instructions:  Your physician has recommended you make the following change in your medication:  START: Propranolol 10 mg nightly *If you need a refill on your cardiac medications before your next appointment, please call your pharmacy*   Lab Work: None If you have labs (blood work) drawn today and your tests are completely normal, you will receive your results only by: [place] (if you have MyChart) OR A paper copy in the  mail If you have any lab test that is abnormal or we need to change your treatment, we will call you to review the results.   Testing/Procedures: None   Follow-Up: At [facility], you and your health needs are our priority.  As part of our continuing mission to provide you with exceptional heart care, we have created designated Provider Care Teams.  These Care Teams include your primary Cardiologist (physician) and Advanced Practice Providers (APPs -  Physician Assistants and Nurse Practitioners) who all work together to provide you with the care you need, when you need it.  We recommend signing up for the patient portal called "MyChart".  Sign up information is provided on this After Visit Summary.  MyChart is used to connect with patients for Virtual Visits (Telemedicine).  Patients are able to view lab/test results, encounter notes, upcoming appointments, etc.  Non-urgent messages can be sent to your provider as well.   To learn more about what you can do with MyChart, go to [email or web address].    Your next appointment:   4 month(s)  Provider:   Berniece Salines, DO    Other instructions: [email or web address] Steps to Quit Smoking Smoking tobacco is the leading cause of preventable death. It can affect almost every organ in the body. Smoking puts you and people around you at risk for many serious, long-lasting (chronic) diseases. Quitting smoking can be hard, but it is one of the best things that you can do for your health. It is never too late to quit. Do not give up if you cannot quit the first time. Some people need to try many times to quit. Do your best to stick to your quit plan, and talk with your doctor if you have any questions or concerns. How do I get ready to quit? Pick a date to quit. Set a date within the next 2 weeks to give you time to prepare. Write down the reasons why you are quitting. Keep this list in places where you will see it often. Tell your family, friends,  and co-workers that you are quitting. Their support is important. Talk with your doctor about the choices that may help you quit. Find out if your health insurance will pay for these treatments. Know the people, places, things, and activities that make you want to smoke (triggers). Avoid them. What first steps can I take to quit smoking? Throw away all cigarettes at home, at work, and in your car. Throw away the things that you use when you smoke, such as ashtrays and lighters. Clean your car. Empty the ashtray. Clean your home, including curtains and carpets. What can I do to help me quit smoking? Talk with your doctor about taking medicines and seeing a counselor. You are more likely to succeed when you do  both. If you are pregnant or breastfeeding: Talk with your doctor about counseling or other ways to quit smoking. Do not take medicine to help you quit smoking unless your doctor tells you to. Quit right away Quit smoking completely, instead of slowly cutting back on how much you smoke over a period of time. Stopping smoking right away may be more successful than slowly quitting. Go to counseling. In-person is best if this is an option. You are more likely to quit if you go to counseling sessions regularly. Take medicine You may take medicines to help you quit. Some medicines need a prescription, and some you can buy over-the-counter. Some medicines may contain a drug called nicotine to replace the nicotine in cigarettes. Medicines may: Help you stop having the desire to smoke (cravings). Help to stop the problems that come when you stop smoking (withdrawal symptoms). Your doctor may ask you to use: Nicotine patches, gum, or lozenges. Nicotine inhalers or sprays. Non-nicotine medicine that you take by mouth. Find resources Find resources and other ways to help you quit smoking and remain smoke-free after you quit. They include: Online chats with a Social worker. Phone quitlines. Printed  Furniture conservator/restorer. Support groups or group counseling. Text messaging programs. Mobile phone apps. Use apps on your mobile phone or tablet that can help you stick to your quit plan. Examples of free services include Quit Guide from the CDC and smokefree.gov  What can I do to make it easier to quit?  Talk to your family and friends. Ask them to support and encourage you. Call a phone quitline, such as 1-800-QUIT-NOW, reach out to support groups, or work with a Social worker. Ask people who smoke to not smoke around you. Avoid places that make you want to smoke, such as: Bars. Parties. Smoke-break areas at work. Spend time with people who do not smoke. Lower the stress in your life. Stress can make you want to smoke. Try these things to lower stress: Getting regular exercise. Doing deep-breathing exercises. Doing yoga. Meditating. What benefits will I see if I quit smoking? Over time, you may have: A better sense of smell and taste. Less coughing and sore throat. A slower heart rate. Lower blood pressure. Clearer skin. Better breathing. Fewer sick days. Summary Quitting smoking can be hard, but it is one of the best things that you can do for your health. Do not give up if you cannot quit the first time. Some people need to try many times to quit. When you decide to quit smoking, make a plan to help you succeed. Quit smoking right away, not slowly over a period of time. When you start quitting, get help and support to keep you smoke-free. This information is not intended to replace advice given to you by your health care provider. Make sure you discuss any questions you have with your health care provider. Document Revised: 02/22/2021 Document Reviewed: 02/22/2021 Elsevier Patient Education  [place].    Adopting a Healthy Lifestyle.  Know what a healthy weight is for you (roughly BMI <25) and aim to maintain this   Aim for 7+ servings of fruits and vegetables  daily   65-80+ fluid ounces of water or unsweet tea for healthy kidneys   Limit to max 1 drink of alcohol per day; avoid smoking/tobacco   Limit animal fats in diet for cholesterol and heart health - choose grass fed whenever available   Avoid highly processed foods, and foods high in saturated/trans fats   Aim  for low stress - take time to unwind and care for your mental health   Aim for 150 min of moderate intensity exercise weekly for heart health, and weights twice weekly for bone health   Aim for 7-9 hours of sleep daily   When it comes to diets, agreement about the perfect plan isnt easy to find, even among the experts. Experts at the [place] developed an idea known as the Healthy Eating Plate. Just imagine a plate divided into logical, healthy portions.   The emphasis is on diet quality:   Load up on vegetables and fruits - one-half of your plate: Aim for color and variety, and remember that potatoes dont count.   Go for whole grains - one-quarter of your plate: Whole wheat, barley, wheat berries, quinoa, oats, brown rice, and foods made with them. If you want pasta, go with whole wheat pasta.   Protein power - one-quarter of your plate: Fish, chicken, beans, and nuts are all healthy, versatile protein sources. Limit red meat.   The diet, however, does go beyond the plate, offering a few other suggestions.   Use healthy plant oils, such as olive, canola, soy, corn, sunflower and peanut. Check the labels, and avoid partially hydrogenated oil, which have unhealthy trans fats.   If youre thirsty, drink water. Coffee and tea are good in moderation, but skip sugary drinks and limit milk and dairy products to one or two daily servings.   The type of carbohydrate in the diet is more important than the amount. Some sources of carbohydrates, such as vegetables, fruits, whole grains, and beans-are healthier than others.   Finally, stay  active  Signed, Berniece Salines, DO  05/01/2022 8:53 AM    [place]

## 2022-05-26 ENCOUNTER — Encounter: Payer: Self-pay | Admitting: Cardiology

## 2022-05-26 LAB — LAB REPORT - SCANNED: EGFR: 60

## 2022-06-05 NOTE — Progress Notes (Signed)
Office Visit    Patient Name: Ellen Shaw Date of Encounter: 06/05/2022  Primary Care Provider:  Marga Hoots, NP Primary Cardiologist:  Berniece Salines, DO Primary Electrophysiologist: None  Chief Complaint    Ellen Shaw is a 42 y.o. female with PMH of HTN, morbid obesity, NSVT, HLD, migraines, OSA (on CPAP), tobacco abuse who presents today for posthospital follow-up for recent complaint of lightheadedness.  Past Medical History    Past Medical History:  Diagnosis Date   Alcohol abuse 02/21/2014   Allergic rhinitis 01/31/2008   Anxiety 07/22/2016   Arthritis 07/22/2016   Knees   Asthma 01/31/2008   Bell's palsy    age 34-14   Bilateral leg edema 02/15/2020   Chest discomfort 03/19/2020   Complex atypical endometrial hyperplasia [id number]   Folliculitis 0000000   GERD (gastroesophageal reflux disease) 99991111   History of Helicobacter pylori infection 03/03/2017   Hyperlipidemia    Hypertension    Intertrigo 09/01/2017   Lumbosacral radiculopathy 09/01/2013   Major depressive disorder, recurrent episode ([place]) 05/02/2014   Medication overuse headache 07/27/2014   Migraine with aura and without status migrainosus, not intractable 07/27/2014   Follows with Dr. Cristela Blue in Neurology. 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[place])    Patellofemoral pain syndrome 10/12/2012   Posttraumatic stress disorder 02/04/2012   Prediabetes 06/30/2017   A1C 6.1 on 06/2017   Pseudotumor    PVC (premature ventricular contraction) 03/19/2020   Right bundle branch block 02/15/2020   Right cervical radiculopathy 07/18/2014   SVT (supraventricular tachycardia) 07/15/2016   Tobacco use [id number]   Umbilical cyst  0000000   Urinary retention 08/19/2013   S/P interstim placement with Urology   Vulvovaginal candidiasis 03/03/2017   Past Surgical History:  Procedure Laterality Date   BLADDER SURGERY     CHOLECYSTECTOMY N/A 01/09/2022   Procedure: LAPAROSCOPIC CHOLECYSTECTOMY;  Surgeon: Mickeal Skinner, MD;  Location: [facility];  Service: General;  Laterality: N/A;   EYELID LACERATION REPAIR Bilateral    NASAL SEPTUM SURGERY     TONSILLECTOMY AND ADENOIDECTOMY     VAGINAL HYSTERECTOMY  2015    Allergies  Allergies  Allergen Reactions   Bee Venom Shortness Of Breath and Swelling   Ciprofloxacin Anaphylaxis, Shortness Of Breath and Rash   Clindamycin Shortness Of Breath   Esomeprazole Magnesium Hives and Shortness Of Breath   Nitrofurantoin Shortness Of Breath   Oxycodone Other (See Comments)    Other: "I passed out." Other: convulsions "shaking" Other reaction(s): Dizziness, Hypotension, Wheezing (ALLERGY/intolerance)     Sulfa Antibiotics Shortness Of Breath and Rash   Trimethoprim     Other reaction(s): Rash, Rash, Shortness of breath   Citrus Hives   Doxycycline Other (See Comments)    Muscle spasms  Bad muscle spasms    Fluticasone Rash and Other (See Comments)    "Headache and nosebleed"     Penicillins Rash   Buspirone    Fentanyl Other (See Comments)    "I pass out."  *tolerated 06/11/2021 and 01/09/22 intraoperatively without complications*     Lexapro [Escitalopram]    Paliperidone Other (See Comments)    Other reaction(s): Dystonia   Pantoprazole Hives    Relieved with Benadryl    History  of Present Illness    Ellen Shaw  is a 42year old female with the above mention past medical history who presents today for posthospital follow-up for complaint of lightheadedness.  Ellen Shaw has been followed by Dr.Tobb since 2021 when she was seen for evaluation of palpitations.  She reported a complaint of intermittent palpitations with abrupt onset lasting a few  minutes.  She reported significant shortness of breath during these episodes and chest tightness.  She was placed on a ZIO monitor for 14 days and TEE was ordered to rule out any structural abnormalities due to lower extremity edema.  Event monitor results showed no pauses or AV block no evidence of atrial fibrillation.  Echo results also showed no evidence of structural heart abnormalities or valvular dysfunction.  She was seen again in follow-up 03/2020 with complaint of ongoing palpitations and dizziness.  She was placed on Cardizem and eventually switched to propranolol and flecainide.  She underwent a cardiac CTA that revealed no evidence of CAD with calcium score of 0.  She was seen in follow-up 10/2020 and propranolol had been discontinued due to low blood pressures.  She however started having significant amounts of palpitations and 14-day ZIO monitor was ordered for further evaluation.  ZIO monitor results showed no evidence of sustained arrhythmias.  She was restarted on propranolol 10 mg and was advised to lose weight and increase physical activity.  She was also advised to stop smoking.  She continues to have significant leg swelling and his Lasix was restarted at 20 mg once a week for symptom management.  She was last seen by Dr.Tobb on 04/28/2022 for follow-up.  She reported that time doing well no interval hospital visits.  Ellen Shaw presents today for post ED follow-up of recent episode of presyncope and dizziness.  Since last being seen in the office patient reports she experienced an episode of lightheadedness and dizziness after developing a bug bite on her right lower ankle.  She treated the area with alcohol wipes and developed sudden onset of cold sweats and nausea which improved with lying down and was really exacerbated with standing up.  She went to her PCP who suggested she follow-up in the ED at [facility].  During her ED visit and EKG and CT scan were both completed that were normal.   She was discharged in stable condition with no further interventions necessary at that time.  During today's visit she reported some resolution of her symptoms.  Her blood pressure today was 138/70 and heart rate was 70.  She was euvolemic on exam today with +1 lower extremity edema present.  She denied any palpitations or skipped heartbeats during her dizziness and presyncope episode.  Patient denies chest pain, palpitations, dyspnea, PND, orthopnea, nausea, vomiting, dizziness, syncope, edema, weight gain, or early satiety.   Home Medications    Current Outpatient Medications  Medication Sig Dispense Refill   albuterol (VENTOLIN HFA) 108 (90 Base) MCG/ACT inhaler Inhale 2 puffs into the lungs every 6 (six) hours as needed for wheezing or shortness of breath. 8 g 6   atorvastatin (LIPITOR) 10 MG tablet      Cholecalciferol (VITAMIN D3) 1.25 MG (50000 UT) CAPS Take 50,000 Units by mouth once a week.     EPINEPHrine 0.3 mg/0.3 mL IJ SOAJ injection Inject 0.3 mg into the muscle as needed for anaphylaxis.     FARXIGA 5 MG TABS tablet Take 5 mg by mouth every morning.     FEROSUL  325 (65 Fe) MG tablet Take 325 mg by mouth daily.     flecainide (TAMBOCOR) 50 MG tablet Take 1 tablet (50 mg total) by mouth 2 (two) times daily. 180 tablet 3   furosemide (LASIX) 40 MG tablet Take 1 tablet (40 mg total) by mouth every other day. (Patient taking differently: Take 40 mg by mouth every Tuesday, Thursday, and Saturday at 6 PM.) 45 tablet 3   linaclotide (LINZESS) 290 MCG CAPS capsule Take 290 mcg by mouth daily as needed.     loratadine (CLARITIN) 10 MG tablet Take 10 mg by mouth daily as needed for allergies.     Pediatric Multiple Vitamins (FLINTSTONES MULTIVITAMIN PO) Take 2 tablets by mouth daily.     potassium chloride SA (KLOR-CON M20) 20 MEQ tablet Take 1 tablet (20 mEq total) by mouth every other day. (Patient taking differently: Take 20 mEq by mouth every Tuesday, Thursday, and Saturday at 6 PM.) 45  tablet 3   promethazine (PHENERGAN) 12.5 MG tablet Take 12.5 mg by mouth every 6 (six) hours as needed for nausea or vomiting.     propranolol (INDERAL) 10 MG tablet Take 1 tablet (10 mg total) by mouth at bedtime. 90 tablet 3   risperiDONE (RISPERDAL) 3 MG tablet Take 3 mg by mouth at bedtime.     Ubrogepant (UBRELVY) 100 MG TABS Take 100 mg by mouth as needed (migraines).     vitamin B-12 (CYANOCOBALAMIN) 1000 MCG tablet Take 1,000 mcg by mouth daily.     No current facility-administered medications for this visit.     Review of Systems  Please see the history of present illness.    (+) Dizziness (+) Nausea  All other systems reviewed and are otherwise negative except as noted above.  Physical Exam    Wt Readings from Last 3 Encounters:  04/28/22 (!) 321 lb 9.6 oz (145.9 kg)  02/25/22 (!) 319 lb (144.7 kg)  01/09/22 (!) 321 lb (145.6 kg)   [id number] were no vitals filed for this visit.,There is no height or weight on file to calculate BMI.  Constitutional:      Appearance: Healthy appearance. Not in distress.  Neck:     Vascular: JVD normal.  Pulmonary:     Effort: Pulmonary effort is normal.     Breath sounds: No wheezing. No rales. Diminished in the bases Cardiovascular:     Normal rate. Regular rhythm. Normal S1. Normal S2.      Murmurs: There is no murmur.  Edema:    Peripheral edema absent.  Abdominal:     Palpations: Abdomen is soft non tender. There is no hepatomegaly.  Skin:    General: Skin is warm and dry.  Neurological:     General: No focal deficit present.     Mental Status: Alert and oriented to person, place and time.     Cranial Nerves: Cranial nerves are intact.  EKG/LABS/ Recent Cardiac Studies    ECG personally reviewed by me today -none completed today    Lab Results  Component Value Date   WBC 13.0 (H) 12/26/2021   HGB 13.4 12/26/2021   HCT 41.5 12/26/2021   MCV 91.8 12/26/2021   PLT 307 12/26/2021   Lab Results  Component Value Date    CREATININE 0.52 12/26/2021   BUN 7 12/26/2021   NA 137 12/26/2021   K 3.8 12/26/2021   CL 103 12/26/2021   CO2 28 12/26/2021   No results found for: "ALT", "AST", "GGT", "ALKPHOS", "BILITOT"  No results found for: "CHOL", "HDL", "LDLCALC", "LDLDIRECT", "TRIG", "CHOLHDL"  Lab Results  Component Value Date   HGBA1C 6.4 (H) 12/26/2021    Cardiac Studies & Procedures       ECHOCARDIOGRAM  ECHOCARDIOGRAM COMPLETE 03/13/2020  Narrative ECHOCARDIOGRAM REPORT    Patient Name:   [facility] Date of Exam: 03/13/2020 Medical Rec #:  [id number]        Height:       69.0 in Accession #:    [id number]       Weight:       341.0 lb Date of Birth:  [birth year]       BSA:          2.592 m Patient Age:    76 years         BP:           131/80 mmHg Patient Gender: F                HR:           887 bpm. Exam Location:  [place]  Procedure: 2D Echo and Intracardiac Opacification Agent  Indications:    Shortness of breath [R06.02 (ICD-10-CM)]  History:        Patient has no prior history of Echocardiogram examinations. Arrythmias:SVT (supraventricular tachycardia) and RBBB, Signs/Symptoms:Body mass index (BMI) of 50.0 to 59.9 in adult; Risk Factors:Hypertension and Tobacco use.  Sonographer:    Luane School Referring Phys: [id number] KARDIE TOBB   Sonographer Comments: Suboptimal apical window. IMPRESSIONS   1. Left ventricular ejection fraction, by estimation, is 60 to 65%. The left ventricle has normal function. The left ventricle has no regional wall motion abnormalities. Left ventricular diastolic parameters were normal. 2. Right ventricular systolic function is normal. The right ventricular size is normal. 3. The mitral valve is normal in structure. No evidence of mitral valve regurgitation. No evidence of mitral stenosis. 4. The aortic valve is normal in structure. Aortic valve regurgitation is not visualized. No aortic stenosis is present. 5. The inferior vena cava is normal  in size with greater than 50% respiratory variability, suggesting right atrial pressure of 3 mmHg.  FINDINGS Left Ventricle: Left ventricular ejection fraction, by estimation, is 60 to 65%. The left ventricle has normal function. The left ventricle has no regional wall motion abnormalities. Definity contrast agent was given IV to delineate the left ventricular endocardial borders. The left ventricular internal cavity size was normal in size. There is no left ventricular hypertrophy. Left ventricular diastolic parameters were normal.  Right Ventricle: The right ventricular size is normal. No increase in right ventricular wall thickness. Right ventricular systolic function is normal.  Left Atrium: Left atrial size was normal in size.  Right Atrium: Right atrial size was normal in size.  Pericardium: There is no evidence of pericardial effusion.  Mitral Valve: The mitral valve is normal in structure. No evidence of mitral valve regurgitation. No evidence of mitral valve stenosis.  Tricuspid Valve: The tricuspid valve is normal in structure. Tricuspid valve regurgitation is trivial. No evidence of tricuspid stenosis.  Aortic Valve: The aortic valve is normal in structure. Aortic valve regurgitation is not visualized. No aortic stenosis is present.  Pulmonic Valve: The pulmonic valve was normal in structure. Pulmonic valve regurgitation is not visualized. No evidence of pulmonic stenosis.  Aorta: The aortic root is normal in size and structure.  Venous: The inferior vena cava is normal in size with greater than 50% respiratory variability, suggesting right atrial pressure  of 3 mmHg.  IAS/Shunts: No atrial level shunt detected by color flow Doppler.   LEFT VENTRICLE PLAX 2D LVIDd:         5.00 cm  Diastology LVIDs:         2.70 cm  LV e' medial:    6.96 cm/s LV PW:         1.00 cm  LV E/e' medial:  13.7 LV IVS:        1.00 cm  LV e' lateral:   8.49 cm/s LVOT diam:     2.00 cm  LV E/e'  lateral: 11.2 LV SV:         70 LV SV Index:   27 LVOT Area:     3.14 cm   RIGHT VENTRICLE             IVC RV S prime:     12.60 cm/s  IVC diam: 1.00 cm TAPSE (M-mode): 2.4 cm  LEFT ATRIUM             Index       RIGHT ATRIUM          Index LA diam:        3.40 cm 1.31 cm/m  RA Area:     9.82 cm LA Vol (A2C):   42.8 ml 16.51 ml/m RA Volume:   19.80 ml 7.64 ml/m LA Vol (A4C):   37.6 ml 14.51 ml/m LA Biplane Vol: 40.5 ml 15.63 ml/m AORTIC VALVE LVOT Vmax:   108.00 cm/s LVOT Vmean:  80.100 cm/s LVOT VTI:    0.224 m  AORTA Ao Root diam: 3.40 cm Ao Asc diam:  3.40 cm Ao Desc diam: 2.20 cm  MITRAL VALVE MV Area (PHT): 3.77 cm    SHUNTS MV Decel Time: 201 msec    Systemic VTI:  0.22 m MV E velocity: 95.10 cm/s  Systemic Diam: 2.00 cm MV A velocity: 85.90 cm/s MV E/A ratio:  1.11  Jyl Heinz MD Electronically signed by Jyl Heinz MD Signature Date/Time: 03/13/2020/4:24:43 PM    Final    MONITORS  LONG TERM MONITOR-LIVE TELEMETRY (3-14 DAYS) 01/07/2021  Narrative Patch Wear Time:  13 days and 14 hours starting November 12, 2020. Indication palpitation  Patient had a min HR of 51 bpm, max HR of 158 bpm, and avg HR of 78 bpm. Predominant underlying rhythm was Sinus Rhythm. First Degree AV Block was present.  8 Supraventricular Tachycardia runs occurred, the run with the fastest interval lasting 5 beats with a max rate of 158 bpm (avg 128 bpm); the run with the fastest interval was also the longest. Isolated SVEs were rare (<1.0%), SVE Couplets were rare (<1.0%), and SVE Triplets were rare (<1.0%). Isolated VEs were rare (<1.0%), VE Couplets were rare (<1.0%), and no VE Triplets were present. Ventricular Bigeminy and Trigeminy were present.  Symptoms were associated with sinus rhythm and PVC.  No atrial fibrillation.  Conclusion: This study is remarkable for paroxysmal supraventricular tachycardia.   CT SCANS  CT CORONARY MORPH W/CTA COR W/SCORE  04/09/2020  Addendum 04/09/2020 10:23 AM ADDENDUM REPORT: 04/09/2020 10:20  CLINICAL DATA:  This is a 42 year old female with chest pain.  EXAM: Cardiac/Coronary  CT  TECHNIQUE: The patient was scanned on a [employer].  FINDINGS: A 120 kV prospective scan was triggered in the descending thoracic aorta at 111 HU's. Axial non-contrast 3 mm slices were carried out through the heart. The data set was analyzed on a dedicated work  station and scored using the Agatson method. Gantry rotation speed was 250 msecs and collimation was .6 mm. No beta blockade and 0.8 mg of sl NTG was given. The 3D data set was reconstructed in 5% intervals of the 67-82 % of the R-R cycle. Diastolic phases were analyzed on a dedicated work station using MPR, MIP and VRT modes. The patient received 80 cc of contrast.  Aorta: Normal size.  No calcifications.  No dissection.  Aortic Valve:  Trileaflet.  No calcifications.  Coronary Arteries:  Normal coronary origin.  Right dominance.  RCA is a large dominant artery that gives rise to PDA and PLVB. There is no plaque.  Left main is a large artery that gives rise to LAD and LCX arteries.  LAD is a large vessel that has no plaque.  LCX is a non-dominant artery that gives rise to one large OM1 branch. There is no plaque.  Other findings:  Normal pulmonary vein drainage into the left atrium.  Normal left atrial appendage without a thrombus.  Normal size of the pulmonary artery.  IMPRESSION: 1. Coronary calcium score of 0. This was 0 percentile for age and sex matched control.  2. Normal coronary origin with right dominance.  3. No evidence of CAD.  Berniece Salines, DO   Electronically Signed By: Berniece Salines DO On: 04/09/2020 10:20  Narrative EXAM: OVER-READ INTERPRETATION  CT CHEST  The following report is an over-read performed by radiologist Dr. Suzy Bouchard of [facility], [place] on 04/09/2020. This over-read does not  include interpretation of cardiac or coronary anatomy or pathology. The coronary CTA interpretation by the cardiologist is attached.  COMPARISON:  None.  FINDINGS: Limited view of the lung parenchyma demonstrates no suspicious nodularity. Airways are normal.  Limited view of the mediastinum demonstrates no adenopathy. Esophagus normal.  Limited view of the upper abdomen unremarkable.  Limited view of the skeleton and chest wall is unremarkable.  IMPRESSION: No significant extracardiac findings.  Electronically Signed: By: Suzy Bouchard M.D. On: 04/09/2020 09:02          Assessment & Plan    1.  Presyncope: -Patient was recently seen in the ED for complaint of dizziness and postural hypotension. -During today's visit orthostatic blood pressures were obtained that were negative.  She reports some resolution to her symptoms but still endorses occasional episodes of dizziness with standing -Advised her to increase her hydration and make sure to stand slowly from a sitting position. -I advised her if symptoms return or if syncope occurs to contact our office as soon as possible  2.  History of NSVT: -Patient previously documented SVT with no evidence of pauses or AV block -Today patient reports no complaints of palpitations with recent presyncopal episode. -Continue propranolol 10 mg daily  3.  Essential hypertension: -Patient blood pressure today was well-controlled at 138/70 -Continue propranolol 10 mg daily  4.  Tobacco abuse: -Patient reports that she has been smoke-free for the past 2 weeks. -She was congratulated and encouraged to continue to abstain from smoking.  5.  Obesity: -Patient's BMI is 46.87 -She was encouraged to increase physical activity as tolerated and abstain from excess calories in her diet.  Disposition: Follow-up with Berniece Salines, DO or APP in as schedule    Medication Adjustments/Labs and Tests Ordered: Current medicines are reviewed at  length with the patient today.  Concerns regarding medicines are outlined above.   Signed, Mable Fill, Marissa Nestle, NP 06/05/2022, 1:23 PM [facility]  Note:  This document was prepared using Dragon voice recognition software and may include unintentional dictation errors.

## 2022-06-06 ENCOUNTER — Ambulatory Visit: Payer: Medicare Other | Attending: Nurse Practitioner | Admitting: Nurse Practitioner

## 2022-06-06 ENCOUNTER — Encounter: Payer: Self-pay | Admitting: Nurse Practitioner

## 2022-06-06 VITALS — BP 138/70 | HR 70 | Ht 69.0 in | Wt 317.4 lb

## 2022-06-06 DIAGNOSIS — Z720 Tobacco use: Secondary | ICD-10-CM | POA: Diagnosis present

## 2022-06-06 DIAGNOSIS — R55 Syncope and collapse: Secondary | ICD-10-CM | POA: Diagnosis present

## 2022-06-06 DIAGNOSIS — R42 Dizziness and giddiness: Secondary | ICD-10-CM | POA: Diagnosis present

## 2022-06-06 DIAGNOSIS — I4729 Other ventricular tachycardia: Secondary | ICD-10-CM

## 2022-06-06 DIAGNOSIS — I10 Essential (primary) hypertension: Secondary | ICD-10-CM | POA: Diagnosis present

## 2022-06-06 NOTE — Patient Instructions (Addendum)
Medication Instructions:  Your physician recommends that you continue on your current medications as directed. Please refer to the Current Medication list given to you today. *If you need a refill on your cardiac medications before your next appointment, please call your pharmacy*   Lab Work: None ordered    Testing/Procedures: None ordered   Follow-Up: At [facility], you and your health needs are our priority.  As part of our continuing mission to provide you with exceptional heart care, we have created designated Provider Care Teams.  These Care Teams include your primary Cardiologist (physician) and Advanced Practice Providers (APPs -  Physician Assistants and Nurse Practitioners) who all work together to provide you with the care you need, when you need it.  We recommend signing up for the patient portal called "MyChart".  Sign up information is provided on this After Visit Summary.  MyChart is used to connect with patients for Virtual Visits (Telemedicine).  Patients are able to view lab/test results, encounter notes, upcoming appointments, etc.  Non-urgent messages can be sent to your provider as well.   To learn more about what you can do with MyChart, go to [email or web address].    Your next appointment:   FOLLOW UP AS SCHEDULES   Provider:   Berniece Salines, DO     Other Instructions  STAY HYDRATED

## 2022-06-18 ENCOUNTER — Ambulatory Visit: Payer: Medicare Other | Admitting: Skilled Nursing Facility1

## 2022-08-04 DIAGNOSIS — G8929 Other chronic pain: Secondary | ICD-10-CM | POA: Insufficient documentation

## 2022-08-20 ENCOUNTER — Encounter: Payer: Self-pay | Admitting: Cardiology

## 2022-09-09 ENCOUNTER — Ambulatory Visit: Payer: Medicare Other | Attending: Cardiology | Admitting: Cardiology

## 2022-09-09 ENCOUNTER — Encounter: Payer: Self-pay | Admitting: Cardiology

## 2022-09-09 VITALS — BP 136/82 | HR 83 | Ht 69.0 in | Wt 324.4 lb

## 2022-09-09 DIAGNOSIS — I471 Supraventricular tachycardia, unspecified: Secondary | ICD-10-CM | POA: Insufficient documentation

## 2022-09-09 DIAGNOSIS — R7303 Prediabetes: Secondary | ICD-10-CM | POA: Insufficient documentation

## 2022-09-09 DIAGNOSIS — E782 Mixed hyperlipidemia: Secondary | ICD-10-CM | POA: Insufficient documentation

## 2022-09-09 DIAGNOSIS — I493 Ventricular premature depolarization: Secondary | ICD-10-CM | POA: Insufficient documentation

## 2022-09-09 DIAGNOSIS — G4733 Obstructive sleep apnea (adult) (pediatric): Secondary | ICD-10-CM | POA: Diagnosis present

## 2022-09-09 DIAGNOSIS — I4729 Other ventricular tachycardia: Secondary | ICD-10-CM | POA: Diagnosis present

## 2022-09-09 NOTE — Patient Instructions (Signed)
Medication Instructions:  Your physician recommends that you continue on your current medications as directed. Please refer to the Current Medication list given to you today.  *If you need a refill on your cardiac medications before your next appointment, please call your pharmacy*   Lab Work: None   Testing/Procedures: None   Follow-Up: At [facility], you and your health needs are our priority.  As part of our continuing mission to provide you with exceptional heart care, we have created designated Provider Care Teams.  These Care Teams include your primary Cardiologist (physician) and Advanced Practice Providers (APPs -  Physician Assistants and Nurse Practitioners) who all work together to provide you with the care you need, when you need it.  Your next appointment:   6 month(s)  Provider:   Kardie Tobb, DO      

## 2022-09-09 NOTE — Progress Notes (Signed)
Cardiology Office Note:    Date:  09/09/2022   ID:  Ellen Shaw, DOB 01-27-1981, MRN [id number]  PCP:  Doran Stabler, NP  Cardiologist:  Thomasene Ripple, DO  Electrophysiologist:  None   Referring MD: Mikki Santee Key, *   " I am doing well"  History of Present Illness:    Ellen Shaw is a 42 y.o. female with a hx of chronic respiratory failure now on oxygen managed by her primary care doctor, hypertension, morbid obesity, nonsustained ventricular tachycardia, paroxysmal SVT on the monitor, recurrent palpitations here today for follow-up visit.   I saw the patient in February 2022 at that time she was doing well from a cardiovascular standpoint. On June 06, 2022 she was seen by Robin Searing, NP at that time she presented due to presyncope.  It was noted that she may have been dehydrated orthostatics were negative.  All of her other medications were continued.  Today she tells me that she has been working with her psychiatrist to taper off her sertraline for other options.  Past Medical History:  Diagnosis Date   Alcohol abuse 02/21/2014   Allergic rhinitis 01/31/2008   Anxiety 07/22/2016   Arthritis 07/22/2016   Knees   Asthma 01/31/2008   Bell's palsy    age 29-14   Bilateral leg edema 02/15/2020   Chest discomfort 03/19/2020   Complex atypical endometrial hyperplasia 08/01/2013   Folliculitis 10/12/2017   GERD (gastroesophageal reflux disease) 12/03/2012   History of Helicobacter pylori infection 03/03/2017   Hyperlipidemia    Hypertension    Intertrigo 09/01/2017   Lumbosacral radiculopathy 09/01/2013   Major depressive disorder, recurrent episode (HCC) 05/02/2014   Medication overuse headache 07/27/2014   Migraine with aura and without status migrainosus, not intractable 07/27/2014   Follows with Dr. Weston Settle in Neurology. HX pseudotumor cerebri   Mixed stress and urge urinary incontinence 08/12/2013   Morbid obesity (HCC)    NSVT (nonsustained  ventricular tachycardia) (HCC) 03/19/2020   Obstructive sleep apnea on CPAP 12/27/2010   AHI=21.8 AutoPAP at 15-20 with an EPR=3   Other specific personality disorders (HCC)    Patellofemoral pain syndrome 10/12/2012   Posttraumatic stress disorder 02/04/2012   Prediabetes 06/30/2017   A1C 6.1 on 06/2017   Pseudotumor    PVC (premature ventricular contraction) 03/19/2020   Right bundle branch block 02/15/2020   Right cervical radiculopathy 07/18/2014   SVT (supraventricular tachycardia) 07/15/2016   Tobacco use 12/03/2011   Umbilical cyst 06/02/2011   Urinary retention 08/19/2013   S/P interstim placement with Urology   Vulvovaginal candidiasis 03/03/2017    Past Surgical History:  Procedure Laterality Date   BLADDER SURGERY     CHOLECYSTECTOMY N/A 01/09/2022   Procedure: LAPAROSCOPIC CHOLECYSTECTOMY;  Surgeon: Rodman Pickle, MD;  Location: [facility];  Service: General;  Laterality: N/A;   EYELID LACERATION REPAIR Bilateral    NASAL SEPTUM SURGERY     TONSILLECTOMY AND ADENOIDECTOMY     VAGINAL HYSTERECTOMY  2015    Current Medications: Current Meds  Medication Sig   diazepam (VALIUM) 5 MG tablet Take 5 mg by mouth 2 (two) times daily as needed.   methocarbamol (ROBAXIN) 750 MG tablet Take 750 mg by mouth 4 (four) times daily.   traMADol (ULTRAM) 50 MG tablet Take 25 mg by mouth every 6 (six) hours as needed.     Allergies:   Bee venom, Ciprofloxacin, Clindamycin, Esomeprazole magnesium, Nitrofurantoin, Oxycodone, Sulfa antibiotics, Trimethoprim, Citrus, Doxycycline, Fluticasone, Penicillins, Buspirone, Fentanyl, Lexapro [  escitalopram], Paliperidone, and Pantoprazole   Social History   Socioeconomic History   Marital status: Single    Spouse name: Not on file   Number of children: Not on file   Years of education: Not on file   Highest education level: Not on file  Occupational History   Not on file  Tobacco Use   Smoking status: Former    Packs/day: 1     Types: Cigarettes    Quit date: 01/19/2020    Years since quitting: 2.6   Smokeless tobacco: Never  Vaping Use   Vaping Use: Never used  Substance and Sexual Activity   Alcohol use: Not Currently   Drug use: No   Sexual activity: Not on file  Other Topics Concern   Not on file  Social History Narrative   Not on file   Social Determinants of Health   Financial Resource Strain: Not on file  Food Insecurity: Not on file  Transportation Needs: Not on file  Physical Activity: Not on file  Stress: Not on file  Social Connections: Not on file     Family History: The patient's family history includes Atrial fibrillation in her father; COPD in her father; Congestive Heart Failure in her father; Glaucoma in her father; Heart attack in her maternal grandmother; High Cholesterol in her mother; Hypertension in her father; Kidney disease in her father; Lung cancer in her father and paternal grandmother; Macular degeneration in her father; Stroke in her father.  ROS:   Review of Systems  Constitution: Negative for decreased appetite, fever and weight gain.  HENT: Negative for congestion, ear discharge, hoarse voice and sore throat.   Eyes: Negative for discharge, redness, vision loss in right eye and visual halos.  Cardiovascular: Negative for chest pain, dyspnea on exertion, leg swelling, orthopnea and palpitations.  Respiratory: Negative for cough, hemoptysis, shortness of breath and snoring.   Endocrine: Negative for heat intolerance and polyphagia.  Hematologic/Lymphatic: Negative for bleeding problem. Does not bruise/bleed easily.  Skin: Negative for flushing, nail changes, rash and suspicious lesions.  Musculoskeletal: Negative for arthritis, joint pain, muscle cramps, myalgias, neck pain and stiffness.  Gastrointestinal: Negative for abdominal pain, bowel incontinence, diarrhea and excessive appetite.  Genitourinary: Negative for decreased libido, genital sores and incomplete emptying.   Neurological: Negative for brief paralysis, focal weakness, headaches and loss of balance.  Psychiatric/Behavioral: Negative for altered mental status, depression and suicidal ideas.  Allergic/Immunologic: Negative for HIV exposure and persistent infections.    EKGs/Labs/Other Studies Reviewed:    The following studies were reviewed today:   EKG:  None today   CCTA  Aorta: Normal size.  No calcifications.  No dissection.   Aortic Valve:  Trileaflet.  No calcifications.   Coronary Arteries:  Normal coronary origin.  Right dominance.   RCA is a large dominant artery that gives rise to PDA and PLVB. There is no plaque.   Left main is a large artery that gives rise to LAD and LCX arteries.   LAD is a large vessel that has no plaque.   LCX is a non-dominant artery that gives rise to one large OM1 branch. There is no plaque.   Other findings:   Normal pulmonary vein drainage into the left atrium.   Normal left atrial appendage without a thrombus.   Normal size of the pulmonary artery.   IMPRESSION: 1. Coronary calcium score of 0. This was 0 percentile for age and sex matched control.   2. Normal coronary origin with  right dominance.   3. No evidence of CAD.     Zio monitor The patient wore the monitor for 14 days starting February 15, 2020   . Indication: Palpitations   The minimum heart rate was 47 bpm, maximum heart rate was 240 bpm, and average heart rate was 85 bpm. Predominant underlying rhythm was Sinus Rhythm.  First-degree AV block was present.   9 Ventricular Tachycardia runs occurred, the run with the fastest interval lasting 5 beats with a maximum rate of 240 bpm, the longest lasting 5 beats with an average rate of 184 bpm.   Premature atrial complexes were rare less than 1%. Premature Ventricular complexes rare less than 1%.   No pauses, No AV block and no atrial fibrillation present. 14 patient triggered events and 13 diary events associated with  premature ventricular complex.   Conclusion: This study is remarkable for the following:                             1.  Nonsustained ventricular tachycardia                             2.  Rare symptomatic premature ventricular complex.   Transthoracic Echocardiogram IMPRESSIONS  1. Left ventricular ejection fraction, by estimation, is 60 to 65%. The left ventricle has normal function. The left ventricle has no regional wall motion abnormalities. Left ventricular diastolic parameters were normal.   2. Right ventricular systolic function is normal. The right ventricular size is normal.   3. The mitral valve is normal in structure. No evidence of mitral valve regurgitation. No evidence of mitral stenosis.   4. The aortic valve is normal in structure. Aortic valve regurgitation is not visualized. No aortic stenosis is present.   5. The inferior vena cava is normal in size with greater than 50% respiratory variability, suggesting right atrial pressure of 3 mmHg.   FINDINGS   Left Ventricle: Left ventricular ejection fraction, by estimation, is 60 to 65%. The left ventricle has normal function. The left ventricle has no regional wall motion abnormalities. Definity contrast agent was given IV to delineate the left ventricular   endocardial borders. The left ventricular internal cavity size was normal in size. There is no left ventricular hypertrophy. Left ventricular diastolic parameters were normal.   Right Ventricle: The right ventricular size is normal. No increase in right ventricular wall thickness. Right ventricular systolic function is  normal.   Left Atrium: Left atrial size was normal in size.   Right Atrium: Right atrial size was normal in size.   Pericardium: There is no evidence of pericardial effusion.   Mitral Valve: The mitral valve is normal in structure. No evidence of  mitral valve regurgitation. No evidence of mitral valve stenosis.   Tricuspid Valve: The tricuspid valve is  normal in structure. Tricuspid  valve regurgitation is trivial. No evidence of tricuspid stenosis.   Aortic Valve: The aortic valve is normal in structure. Aortic valve  regurgitation is not visualized. No aortic stenosis is present.   Pulmonic Valve: The pulmonic valve was normal in structure. Pulmonic valve  regurgitation is not visualized. No evidence of pulmonic stenosis.   Aorta: The aortic root is normal in size and structure.   Venous: The inferior vena cava is normal in size with greater than 50%  respiratory variability, suggesting right atrial pressure of 3 mmHg.   IAS/Shunts: No  atrial level shunt detected by color flow Doppler.     ZIO monitor 2022 Patch Wear Time:  13 days and 14 hours starting November 12, 2020. Indication palpitation   Patient had a min HR of 51 bpm, max HR of 158 bpm, and avg HR of 78 bpm. Predominant underlying rhythm was Sinus Rhythm. First Degree AV Block was present.    8 Supraventricular Tachycardia runs occurred, the run with the fastest interval lasting 5 beats with a max rate of 158 bpm (avg 128 bpm); the run with the fastest interval was  also the longest. Isolated SVEs were rare (<1.0%), SVE Couplets were rare (<1.0%), and SVE Triplets were rare (<1.0%). Isolated VEs were rare (<1.0%), VE Couplets were rare (<1.0%), and no VE Triplets were present. Ventricular Bigeminy and Trigeminy  were present.   Symptoms were associated with sinus rhythm and PVC.   No atrial fibrillation.   Conclusion: This study is remarkable for paroxysmal supraventricular tachycardia    Recent Labs: 12/26/2021: BUN 7; Creatinine, Ser 0.52; Hemoglobin 13.4; Platelets 307; Potassium 3.8; Sodium 137  Recent Lipid Panel No results found for: "CHOL", "TRIG", "HDL", "CHOLHDL", "VLDL", "LDLCALC", "LDLDIRECT"  Physical Exam:    VS:  BP 136/82   Pulse 83   Ht 5\' 9"  (1.753 m)   Wt (!) 324 lb 6.4 oz (147.1 kg)   SpO2 94%   BMI 47.91 kg/m     Wt Readings from Last 3  Encounters:  09/09/22 (!) 324 lb 6.4 oz (147.1 kg)  06/06/22 (!) 317 lb 6.4 oz (144 kg)  04/28/22 (!) 321 lb 9.6 oz (145.9 kg)     GEN: Well nourished, well developed in no acute distress HEENT: Normal NECK: No JVD; No carotid bruits LYMPHATICS: No lymphadenopathy CARDIAC: S1S2 noted,RRR, no murmurs, rubs, gallops RESPIRATORY:  Clear to auscultation without rales, wheezing or rhonchi  ABDOMEN: Soft, non-tender, non-distended, +bowel sounds, no guarding. EXTREMITIES: No edema, No cyanosis, no clubbing MUSCULOSKELETAL:  No deformity  SKIN: Warm and dry NEUROLOGIC:  Alert and oriented x 3, non-focal PSYCHIATRIC:  Normal affect, good insight  ASSESSMENT:    1. SVT (supraventricular tachycardia)   2. NSVT (nonsustained ventricular tachycardia) (HCC)   3. PVC (premature ventricular contraction)   4. Obstructive sleep apnea on CPAP   5. Prediabetes   6. Mixed hyperlipidemia   7. Morbid obesity (HCC)    PLAN:    She is doing well from a CV standpoint.  Will continue her current medication regimen.  She is working with her psychiatrist to optimize her antidepressive medications.  The patient understands the need to lose weight with diet and exercise. We have discussed specific strategies for this.  Continued CPAP use.  Blood pressure is acceptable, continue with current antihypertensive regimen.  The patient is in agreement with the above plan. The patient left the office in stable condition.  The patient will follow up in 6 months   Medication Adjustments/Labs and Tests Ordered: Current medicines are reviewed at length with the patient today.  Concerns regarding medicines are outlined above.  No orders of the defined types were placed in this encounter.  No orders of the defined types were placed in this encounter.   Patient Instructions  Medication Instructions:  Your physician recommends that you continue on your current medications as directed. Please refer to the  Current Medication list given to you today.  *If you need a refill on your cardiac medications before your next appointment, please call your pharmacy*  Lab Work: None   Testing/Procedures: None   Follow-Up: At [facility], you and your health needs are our priority.  As part of our continuing mission to provide you with exceptional heart care, we have created designated Provider Care Teams.  These Care Teams include your primary Cardiologist (physician) and Advanced Practice Providers (APPs -  Physician Assistants and Nurse Practitioners) who all work together to provide you with the care you need, when you need it.   Your next appointment:   6 month(s)  Provider:   Thomasene Ripple, DO       Adopting a Healthy Lifestyle.  Know what a healthy weight is for you (roughly BMI <25) and aim to maintain this   Aim for 7+ servings of fruits and vegetables daily   65-80+ fluid ounces of water or unsweet tea for healthy kidneys   Limit to max 1 drink of alcohol per day; avoid smoking/tobacco   Limit animal fats in diet for cholesterol and heart health - choose grass fed whenever available   Avoid highly processed foods, and foods high in saturated/trans fats   Aim for low stress - take time to unwind and care for your mental health   Aim for 150 min of moderate intensity exercise weekly for heart health, and weights twice weekly for bone health   Aim for 7-9 hours of sleep daily   When it comes to diets, agreement about the perfect plan isnt easy to find, even among the experts. Experts at the [facility] of [employer] developed an idea known as the Healthy Eating Plate. Just imagine a plate divided into logical, healthy portions.   The emphasis is on diet quality:   Load up on vegetables and fruits - one-half of your plate: Aim for color and variety, and remember that potatoes dont count.   Go for whole grains - one-quarter of your plate: Whole wheat, barley,  wheat berries, quinoa, oats, brown rice, and foods made with them. If you want pasta, go with whole wheat pasta.   Protein power - one-quarter of your plate: Fish, chicken, beans, and nuts are all healthy, versatile protein sources. Limit red meat.   The diet, however, does go beyond the plate, offering a few other suggestions.   Use healthy plant oils, such as olive, canola, soy, corn, sunflower and peanut. Check the labels, and avoid partially hydrogenated oil, which have unhealthy trans fats.   If youre thirsty, drink water. Coffee and tea are good in moderation, but skip sugary drinks and limit milk and dairy products to one or two daily servings.   The type of carbohydrate in the diet is more important than the amount. Some sources of carbohydrates, such as vegetables, fruits, whole grains, and beans-are healthier than others.   Finally, stay active  Signed, Thomasene Ripple, DO  09/09/2022 2:00 PM    [facility]

## 2022-09-27 ENCOUNTER — Encounter: Payer: Self-pay | Admitting: Cardiology

## 2022-12-23 ENCOUNTER — Ambulatory Visit (INDEPENDENT_AMBULATORY_CARE_PROVIDER_SITE_OTHER): Payer: Medicare Other | Admitting: Podiatry

## 2022-12-23 ENCOUNTER — Encounter: Payer: Self-pay | Admitting: Podiatry

## 2022-12-23 DIAGNOSIS — B351 Tinea unguium: Secondary | ICD-10-CM | POA: Diagnosis not present

## 2022-12-23 DIAGNOSIS — E1142 Type 2 diabetes mellitus with diabetic polyneuropathy: Secondary | ICD-10-CM

## 2022-12-23 DIAGNOSIS — M79674 Pain in right toe(s): Secondary | ICD-10-CM

## 2022-12-23 DIAGNOSIS — M79675 Pain in left toe(s): Secondary | ICD-10-CM

## 2022-12-23 NOTE — Progress Notes (Signed)
  Subjective:  Patient ID: Ellen Shaw, female    DOB: 1980/09/30,  MRN: [id number]  Chief Complaint  Patient presents with   Diabetic foot care    Nails, concern for onycho right foot. Patient has been seeing another provider outside of our system for IPK treatment plantar left foot as well    42 y.o. female presents with the above complaint. History confirmed with patient. Patient presenting with pain related to dystrophic thickened elongated nails. Patient is unable to trim own nails related to nail dystrophy and/or mobility issues. Patient does have a history of T2DM. Patient does have some lesions likely porokeratosis that have been treated recently by Dr. Marylene Land.  Had acid applied 1 week ago and has follow-up with her later this month for follow-up on this issue.  Objective:  Physical Exam: warm, good capillary refill nail exam onycholysis and dystrophic nails DP pulses palpable, PT pulses palpable, and protective sensation absent Left Foot:  Pain with palpation of nails due to elongation and dystrophic growth.  Right Foot: Pain with palpation of nails due to elongation and dystrophic growth.   Assessment:   1. Pain due to onychomycosis of toenails of both feet   2. DM type 2 with diabetic peripheral neuropathy (HCC)      Plan:  Patient was evaluated and treated and all questions answered.  # Porokeratosis plantar aspect left foot multiple lesions -Previously treated by Dr. Marylene Land with likely [facility] -Follow-up with Dr. Marylene Land as previously scheduled for this issue  #Onychomycosis with pain  -Nails palliatively debrided as below. -Educated on self-care  Procedure: Nail Debridement Rationale: Pain Type of Debridement: manual, sharp debridement. Instrumentation: Nail nipper, rotary burr. Number of Nails: 10  Return in about 3 months (around 03/25/2023) for [facility].         Corinna Gab, DPM [facility]

## 2022-12-28 ENCOUNTER — Encounter: Payer: Self-pay | Admitting: Cardiology

## 2022-12-29 ENCOUNTER — Other Ambulatory Visit: Payer: Self-pay

## 2022-12-29 MED ORDER — POTASSIUM CHLORIDE CRYS ER 20 MEQ PO TBCR: 20.0000 meq | EXTENDED_RELEASE_TABLET | ORAL | 2 refills | Status: AC

## 2022-12-29 MED ORDER — FUROSEMIDE 40 MG PO TABS: 40.0000 mg | ORAL_TABLET | ORAL | 2 refills | Status: AC

## 2023-01-23 ENCOUNTER — Other Ambulatory Visit: Payer: Self-pay | Admitting: Medical Genetics

## 2023-01-23 DIAGNOSIS — Z006 Encounter for examination for normal comparison and control in clinical research program: Secondary | ICD-10-CM

## 2023-02-25 ENCOUNTER — Other Ambulatory Visit (HOSPITAL_COMMUNITY)
Admission: RE | Admit: 2023-02-25 | Discharge: 2023-02-25 | Disposition: A | Discharge status: Home / Self Care | Payer: Self-pay | Source: Ambulatory Visit | Attending: Oncology | Admitting: Oncology

## 2023-02-25 DIAGNOSIS — Z006 Encounter for examination for normal comparison and control in clinical research program: Secondary | ICD-10-CM | POA: Insufficient documentation

## 2023-02-27 ENCOUNTER — Ambulatory Visit: Payer: Medicare Other | Attending: Cardiology | Admitting: Cardiology

## 2023-02-27 VITALS — BP 150/85 | HR 67 | Ht 69.0 in | Wt 319.8 lb

## 2023-02-27 DIAGNOSIS — E782 Mixed hyperlipidemia: Secondary | ICD-10-CM | POA: Diagnosis present

## 2023-02-27 DIAGNOSIS — I10 Essential (primary) hypertension: Secondary | ICD-10-CM | POA: Diagnosis present

## 2023-02-27 DIAGNOSIS — Z79899 Other long term (current) drug therapy: Secondary | ICD-10-CM | POA: Diagnosis present

## 2023-02-27 MED ORDER — VALSARTAN 80 MG PO TABS: 80.0000 mg | ORAL_TABLET | Freq: Every day | ORAL | 3 refills | Status: DC

## 2023-02-27 NOTE — Patient Instructions (Addendum)
Medication Instructions:  Your physician has recommended you make the following change in your medication:  START: Valsartan 80 mg once daily *If you need a refill on your cardiac medications before your next appointment, please call your pharmacy*   Lab Work: CMET, Mag If you have labs (blood work) drawn today and your tests are completely normal, you will receive your results only by: MyChart Message (if you have MyChart) OR A paper copy in the mail If you have any lab test that is abnormal or we need to change your treatment, we will call you to review the results.   Follow-Up: At [facility], you and your health needs are our priority.  As part of our continuing mission to provide you with exceptional heart care, we have created designated Provider Care Teams.  These Care Teams include your primary Cardiologist (physician) and Advanced Practice Providers (APPs -  Physician Assistants and Nurse Practitioners) who all work together to provide you with the care you need, when you need it.   Your next appointment:   12 week(s)  Provider:   Thomasene Ripple, DO     Other Instructions You are invited to attend: [facility] event on February Friday 7th 2025 at [facility] ([address] [place], [place], [place] [address]) from 8am-12pm. Feel free to invite other women to attend!  See you there!     Please see Pharm-D in 4 weeks

## 2023-02-28 LAB — COMPREHENSIVE METABOLIC PANEL
ALT: 10 [IU]/L (ref 0–32)
AST: 14 [IU]/L (ref 0–40)
Albumin: 3.9 g/dL (ref 3.9–4.9)
Alkaline Phosphatase: 80 [IU]/L (ref 44–121)
BUN/Creatinine Ratio: 15 (ref 9–23)
BUN: 8 mg/dL (ref 6–24)
Bilirubin Total: 0.3 mg/dL (ref 0.0–1.2)
CO2: 24 mmol/L (ref 20–29)
Calcium: 9.2 mg/dL (ref 8.7–10.2)
Chloride: 98 mmol/L (ref 96–106)
Creatinine, Ser: 0.52 mg/dL — ABNORMAL LOW (ref 0.57–1.00)
Globulin, Total: 2.4 g/dL (ref 1.5–4.5)
Glucose: 99 mg/dL (ref 70–99)
Potassium: 4.4 mmol/L (ref 3.5–5.2)
Sodium: 140 mmol/L (ref 134–144)
Total Protein: 6.3 g/dL (ref 6.0–8.5)
eGFR: 118 mL/min/{1.73_m2} (ref >59)

## 2023-02-28 LAB — MAGNESIUM: Magnesium: 2.1 mg/dL (ref 1.6–2.3)

## 2023-03-05 ENCOUNTER — Ambulatory Visit: Payer: Medicare Other | Admitting: Pulmonary Disease

## 2023-03-05 VITALS — BP 118/78 | HR 71 | Temp 98.4°F | Ht 69.0 in | Wt 310.4 lb

## 2023-03-05 DIAGNOSIS — E662 Morbid (severe) obesity with alveolar hypoventilation: Secondary | ICD-10-CM

## 2023-03-05 DIAGNOSIS — G4733 Obstructive sleep apnea (adult) (pediatric): Secondary | ICD-10-CM

## 2023-03-05 DIAGNOSIS — J4520 Mild intermittent asthma, uncomplicated: Secondary | ICD-10-CM | POA: Diagnosis not present

## 2023-03-05 NOTE — Patient Instructions (Addendum)
We will order you a mask fitting session at [facility] for a [employer]ware full face mask  If you continue to have pressure issues in your ears once using the machine again, we will consider dropping your pressure settings again to help.   Continue to use supplemental oxygen 2L at night and during the day to maintain oxygen saturation 88% or higher  Continue albuterol inhaler as needed  Follow up in 3 months for CPAP compliance review

## 2023-03-05 NOTE — Progress Notes (Signed)
Synopsis: Referred in November 2022 for chronic hypoxemic respiratory failure by Graylon Gunning, PA  Subjective:   PATIENT ID: Ellen Shaw GENDER: female DOB: [birth year], MRN: [id number]  HPI  Chief Complaint  Patient presents with   Follow-up    Sob daily on exertion, denies cough/wheeze   Ellen Shaw is a 42 year old woman, former smoker with obesity, chronic hypoxemic respiratory failure and obstructive sleep apnea who returns to pulmonary clinic for follow up.   She presents with issues related to her CPAP machine. She reports discomfort with the current mask, which covers both the nose and mouth, and triggers traumatic memories. The patient expresses a preference for a mask that only covers the nose, similar to one she had used previously. She also reports occasional cough and shortness of breath, which she attributes to not using her oxygen as much due to helping her family move. The patient has been actively trying to lose weight and has been successful in doing so. She also reports a recent foot fracture, for which she is currently wearing a boot.  DME company [employer]   OV 02/25/2022 She has been losing weight. She is going to the gym and walking on the treadmill. She had a gallbladder surgery in October and was not eating a lot during that period of time. She does report wheezing on days with cold air like today.   She is using 1-2L of oxygen.   OV 06/04/21 She has done well since last visit. She is working with a nutritionist on weight loss but has not lost any weight since the beginning of the year.   She has an upcoming surgery scheduled with ENT for deviated septum and nasal obstruction.   She is intolerant of CPAP therapy due to ear fullness for her OSA.   OV 03/04/2021 HRCT Chest from 02/05/21 did not show any parenchymal or airway issues. PFTs today show mild restrictive defect with normal diffusion capacity. There was slight improvement in FEV1, FVC, TLC and  DLCO overall compared to prior PFTs from [place].   She continues to have exertional dyspnea. Denies cough, wheezing or chest tightness. She reports episodes of her heart rate and a decline in SpO2. She does continue to have palpations despite being on flecainide and propranolol per cardiology.   She is being evaluated by ENT in [place], [place] who is planning to operate on bilateral bone spurs of her nose.   She has not returned to smoking. She started going to the weight management clinic last month.  OV 01/16/21 She was started on oxygen earlier this year by her primary care team due to SpO2 readings in the mid 80s on room air. She is currently on 1-2L of supplemental oxygen. She is using CPAP therapy at night and is followed at [facility] for her sleep apnea.   The oxygen need is new since being sick with covid 19 in 11/2019 and again 07/2020.   She had pulmonary function tests performed 07/05/20 which showed FEV1 2.58L (74%), FVC 3.12L (72%), ratio 83, TLC 4.14L (71%), DLCO 30.99 (74%).  She was evaluated by Dr. Eual Fines of pulmonary at [facility] on 07/12/20. She was referred to pulmonary rehab at [facility] and is waiting to be enrolled in this program. They trialed her on anoro ellipta but she did not tolerate this medicine due to chest discomfort. They recommended weight loss.  She is seeing a nutritionist at this time to work on weight loss.  She quit smoking 04/2020 and smoked 1-2 packs over the past 10 years.   Past Medical History:  Diagnosis Date   Alcohol abuse 02/21/2014   Allergic rhinitis 01/31/2008   Anxiety 07/22/2016   Arthritis 07/22/2016   Knees   Asthma 01/31/2008   Bell's palsy    age 70-12   Bilateral leg edema 02/15/2020   Chest discomfort 03/19/2020   Complex atypical endometrial hyperplasia 08/01/2013   Folliculitis 10/12/2017   GERD (gastroesophageal reflux disease) 12/03/2012   History of Helicobacter pylori infection 03/03/2017    Hyperlipidemia    Hypertension    Intertrigo 09/01/2017   Lumbosacral radiculopathy 09/01/2013   Major depressive disorder, recurrent episode (HCC) 05/02/2014   Medication overuse headache 07/27/2014   Migraine with aura and without status migrainosus, not intractable 07/27/2014   Follows with Dr. Weston Settle in Neurology. HX pseudotumor cerebri   Mixed stress and urge urinary incontinence 08/12/2013   Morbid obesity (HCC)    NSVT (nonsustained ventricular tachycardia) (HCC) 03/19/2020   Obstructive sleep apnea on CPAP 12/27/2010   AHI=21.8 AutoPAP at 15-20 with an EPR=3   Other specific personality disorders (HCC)    Patellofemoral pain syndrome 10/12/2012   Posttraumatic stress disorder 02/04/2012   Prediabetes 06/30/2017   A1C 6.1 on 06/2017   Pseudotumor    PVC (premature ventricular contraction) 03/19/2020   Right bundle branch block 02/15/2020   Right cervical radiculopathy 07/18/2014   SVT (supraventricular tachycardia) (HCC) 07/15/2016   Tobacco use 12/03/2011   Umbilical cyst 06/02/2011   Urinary retention 08/19/2013   S/P interstim placement with Urology   Vulvovaginal candidiasis 03/03/2017     Family History  Problem Relation Age of Onset   COPD Father    Lung cancer Father    Congestive Heart Failure Father    Hypertension Father    Kidney disease Father    Atrial fibrillation Father    Macular degeneration Father    Glaucoma Father    Stroke Father    High Cholesterol Mother    Heart attack Maternal Grandmother    Lung cancer Paternal Grandmother      Social History   Socioeconomic History   Marital status: Single    Spouse name: Not on file   Number of children: Not on file   Years of education: Not on file   Highest education level: Not on file  Occupational History   Not on file  Tobacco Use   Smoking status: Former    Current packs/day: 0.00    Types: Cigarettes    Quit date: 01/19/2020    Years since quitting: 3.1   Smokeless tobacco: Never   Vaping Use   Vaping status: Never Used  Substance and Sexual Activity   Alcohol use: Not Currently   Drug use: No   Sexual activity: Not on file  Other Topics Concern   Not on file  Social History Narrative   Not on file   Social Drivers of Health   Financial Resource Strain: Not on file  Food Insecurity: Not on file  Transportation Needs: Not on file  Physical Activity: Not on file  Stress: Not on file  Social Connections: Not on file  Intimate Partner Violence: Not on file     Allergies  Allergen Reactions   Bee Venom Shortness Of Breath and Swelling   Ciprofloxacin Anaphylaxis, Shortness Of Breath and Rash   Clindamycin Shortness Of Breath   Esomeprazole Magnesium Hives and Shortness Of Breath   Nitrofurantoin Shortness Of Breath  Oxycodone Other (See Comments)    Other: "I passed out." Other: convulsions "shaking" Other reaction(s): Dizziness, Hypotension, Wheezing (ALLERGY/intolerance)     Sulfa Antibiotics Shortness Of Breath and Rash   Trimethoprim     Other reaction(s): Rash, Rash, Shortness of breath   Citrus Hives   Doxycycline Other (See Comments)    Muscle spasms  Bad muscle spasms    Fluticasone Rash and Other (See Comments)    "Headache and nosebleed"     Penicillins Rash   Buspirone    Fentanyl Other (See Comments)    "I pass out."  *tolerated 06/11/2021 and 01/09/22 intraoperatively without complications*     Lexapro [Escitalopram]    Paliperidone Other (See Comments)    Other reaction(s): Dystonia   Pantoprazole Hives    Relieved with Benadryl     Outpatient Medications Prior to Visit  Medication Sig Dispense Refill   albuterol (VENTOLIN HFA) 108 (90 Base) MCG/ACT inhaler Inhale 2 puffs into the lungs every 6 (six) hours as needed for wheezing or shortness of breath. 8 g 6   atorvastatin (LIPITOR) 10 MG tablet Take 10 mg by mouth daily.     Cholecalciferol (VITAMIN D3) 1.25 MG (50000 UT) CAPS Take 50,000 Units by mouth once a week.      diazepam (VALIUM) 5 MG tablet Take 5 mg by mouth 2 (two) times daily as needed.     diclofenac (VOLTAREN) 75 MG EC tablet Take 75 mg by mouth 2 (two) times daily.     EPINEPHrine 0.3 mg/0.3 mL IJ SOAJ injection Inject 0.3 mg into the muscle as needed for anaphylaxis.     FARXIGA 5 MG TABS tablet Take 5 mg by mouth every morning.     flecainide (TAMBOCOR) 50 MG tablet Take 1 tablet (50 mg total) by mouth 2 (two) times daily. 180 tablet 3   furosemide (LASIX) 40 MG tablet Take 1 tablet (40 mg total) by mouth every other day. (Patient taking differently: Take 40 mg by mouth every other day. Taking Tuesday, Thursday, Saturday) 45 tablet 2   linaclotide (LINZESS) 290 MCG CAPS capsule Take 290 mcg by mouth daily as needed.     lithium carbonate (LITHOBID) 300 MG ER tablet Take 300 mg by mouth at bedtime.     loratadine (CLARITIN) 10 MG tablet Take 10 mg by mouth daily as needed for allergies.     LORazepam (ATIVAN) 0.5 MG tablet Take 0.5 mg by mouth every 8 (eight) hours as needed for anxiety.     ondansetron (ZOFRAN) 4 MG tablet Take 4 mg by mouth once.     OXYGEN Inhale 2 L/mL into the lungs See admin instructions.     Pediatric Multiple Vitamins (FLINTSTONES MULTIVITAMIN PO) Take 2 tablets by mouth daily.     potassium chloride SA (KLOR-CON M20) 20 MEQ tablet Take 1 tablet (20 mEq total) by mouth every other day. (Patient taking differently: Take 20 mEq by mouth every other day. Taking Tuesday, Thursday, Saturday) 45 tablet 2   prazosin (MINIPRESS) 1 MG capsule Take 1 mg by mouth at bedtime.     propranolol (INDERAL) 10 MG tablet Take 1 tablet (10 mg total) by mouth at bedtime. 90 tablet 3   TRINTELLIX 10 MG TABS tablet Take 10 mg by mouth daily.     Ubrogepant (UBRELVY) 100 MG TABS Take 100 mg by mouth as needed (migraines).     valsartan (DIOVAN) 80 MG tablet Take 1 tablet (80 mg total) by mouth daily. 90 tablet  3   vitamin B-12 (CYANOCOBALAMIN) 1000 MCG tablet Take 1,000 mcg by mouth daily.      methocarbamol (ROBAXIN) 750 MG tablet Take 750 mg by mouth 4 (four) times daily. (Patient not taking: Reported on 03/05/2023)     pregabalin (LYRICA) 50 MG capsule Take 50 mg by mouth 2 (two) times daily. Taking 50 mg in am, 100 mg at night     promethazine (PHENERGAN) 12.5 MG tablet Take 12.5 mg by mouth every 6 (six) hours as needed for nausea or vomiting. (Patient not taking: Reported on 02/27/2023)     risperiDONE (RISPERDAL) 3 MG tablet Take 3 mg by mouth at bedtime. (Patient not taking: Reported on 03/05/2023)     sertraline (ZOLOFT) 100 MG tablet Take 100 mg by mouth daily. (Patient not taking: Reported on 03/05/2023)     traMADol (ULTRAM) 50 MG tablet Take 25 mg by mouth every 6 (six) hours as needed. (Patient not taking: Reported on 03/05/2023)     No facility-administered medications prior to visit.    Review of Systems  Constitutional:  Negative for chills, fever, malaise/fatigue and weight loss.  HENT:  Negative for congestion, sinus pain and sore throat.   Eyes: Negative.   Respiratory:  Positive for shortness of breath. Negative for cough, hemoptysis, sputum production and wheezing.   Cardiovascular:  Negative for chest pain, palpitations, orthopnea, claudication and leg swelling.  Gastrointestinal:  Negative for abdominal pain, heartburn, nausea and vomiting.  Genitourinary: Negative.   Musculoskeletal:  Negative for joint pain and myalgias.  Skin:  Negative for rash.  Neurological:  Negative for weakness.  Endo/Heme/Allergies: Negative.   Psychiatric/Behavioral: Negative.      Objective:   Vitals:   03/05/23 1055  BP: 118/78  Pulse: 71  Temp: 98.4 F (36.9 C)  TempSrc: Temporal  SpO2: 97%  Weight: (!) 310 lb 6.4 oz (140.8 kg)  Height: 5\' 9"  (1.753 m)   Physical Exam Constitutional:      General: She is not in acute distress.    Appearance: She is obese. She is not ill-appearing.  HENT:     Head: Normocephalic and atraumatic.  Eyes:     General: No  scleral icterus. Cardiovascular:     Rate and Rhythm: Normal rate and regular rhythm.     Pulses: Normal pulses.     Heart sounds: Normal heart sounds. No murmur heard. Pulmonary:     Effort: Pulmonary effort is normal.     Breath sounds: Decreased breath sounds present. No wheezing, rhonchi or rales.  Musculoskeletal:     Right lower leg: No edema.     Left lower leg: No edema.  Skin:    General: Skin is warm and dry.  Neurological:     General: No focal deficit present.     Mental Status: She is alert.    CBC    Component Value Date/Time   WBC 13.0 (H) 12/26/2021 1300   RBC 4.52 12/26/2021 1300   HGB 13.4 12/26/2021 1300   HCT 41.5 12/26/2021 1300   PLT 307 12/26/2021 1300   MCV 91.8 12/26/2021 1300   MCH 29.6 12/26/2021 1300   MCHC 32.3 12/26/2021 1300   RDW 13.3 12/26/2021 1300   LYMPHSABS 2.8 08/02/2010 1501   MONOABS 0.7 08/02/2010 1501   EOSABS 0.4 08/02/2010 1501   BASOSABS 0.0 08/02/2010 1501      Latest Ref Rng & Units 02/27/2023   11:58 AM 12/26/2021    1:00 PM 07/04/2021   11:28 AM  BMP  Glucose 70 - 99 mg/dL 99  161  096   BUN 6 - 24 mg/dL 8  7  11    Creatinine 0.57 - 1.00 mg/dL 0.45  4.09  8.11   BUN/Creat Ratio 9 - 23 15   19    Sodium 134 - 144 mmol/L 140  137  139   Potassium 3.5 - 5.2 mmol/L 4.4  3.8  3.8   Chloride 96 - 106 mmol/L 98  103  98   CO2 20 - 29 mmol/L 24  28  28    Calcium 8.7 - 10.2 mg/dL 9.2  8.8  9.2    Chest imaging: CTA Chest 03/01/21 No evidence of pulmonary embolism or other acute intrathoracic process.  HRCT Chest 02/05/21 1. No findings to suggest interstitial lung disease. No acute findings in the thorax to account for the patient's symptoms.  CXR 07/02/20 The lungs are clear without focal pneumonia, edema, pneumothorax or pleural effusion. The cardiopericardial silhouette is within normal limits for size. Thoracolumbar scoliosis.   CTA Chest 12/30/19 1. No demonstrable pulmonary embolus. No thoracic aortic  aneurysm or dissection. 2. Minimal left base atelectasis. Lungs otherwise clear. 3. No evident thoracic adenopathy.   PFT:    Latest Ref Rng & Units 03/04/2021   10:58 AM  PFT Results  FVC-Pre L 3.08   FVC-Predicted Pre % 71   FVC-Post L 3.22   FVC-Predicted Post % 74   Pre FEV1/FVC % % 83   Post FEV1/FCV % % 84   FEV1-Pre L 2.56   FEV1-Predicted Pre % 73   FEV1-Post L 2.69   DLCO uncorrected ml/min/mmHg 20.62   DLCO UNC% % 80   DLCO corrected ml/min/mmHg 20.19   DLCO COR %Predicted % 78   DLVA Predicted % 103   TLC L 4.54   TLC % Predicted % 78   RV % Predicted % 82     Labs:  Path:  Echo 03/13/2020: LV ef 60-65%. LV diastolic parameters are normal. RV systolic function is normal. RV size is normal.   Heart Catheterization:  Assessment & Plan:   Obstructive sleep apnea on CPAP - Plan: Ambulatory Referral for DME  Obesity hypoventilation syndrome (HCC)  Mild intermittent reactive airway disease without complication  Discussion: Ellen Shaw is a 42 year old woman, former smoker with obesity, chronic hypoxemic respiratory failure and obstructive sleep apnea who returns to pulmonary clinic for follow up.   Obstructive Sleep Apnea Patient reports difficulty tolerating current full face CPAP mask due to traumatic memories. Last sleep study was several years ago. Patient has lost significant weight since then. Patient has a history of removing mask during sleep. -Schedule mask fitting session for a [employer] full face mask  -Check pressure settings if mask is tolerated but causing ear discomfort. -Consider ordering a different mask if the new mask still feels claustrophobic (nasal pillow with chinstrap)  Obesity Hypoventilation Syndrome Weight Management Patient has lost significant weight since last visit, currently at 310 lbs from 347 lbs. Continued weight loss may improve sleep apnea and need for oxygen. -Encourage continued weight loss, with a goal of  reaching 275 lbs.  Follow up in 3 months.   Melody Comas, MD [facility] Office: [phone]    Current Outpatient Medications:    albuterol (VENTOLIN HFA) 108 (90 Base) MCG/ACT inhaler, Inhale 2 puffs into the lungs every 6 (six) hours as needed for wheezing or shortness of breath., Disp: 8 g, Rfl: 6  atorvastatin (LIPITOR) 10 MG tablet, Take 10 mg by mouth daily., Disp: , Rfl:    Cholecalciferol (VITAMIN D3) 1.25 MG (50000 UT) CAPS, Take 50,000 Units by mouth once a week., Disp: , Rfl:    diazepam (VALIUM) 5 MG tablet, Take 5 mg by mouth 2 (two) times daily as needed., Disp: , Rfl:    diclofenac (VOLTAREN) 75 MG EC tablet, Take 75 mg by mouth 2 (two) times daily., Disp: , Rfl:    EPINEPHrine 0.3 mg/0.3 mL IJ SOAJ injection, Inject 0.3 mg into the muscle as needed for anaphylaxis., Disp: , Rfl:    FARXIGA 5 MG TABS tablet, Take 5 mg by mouth every morning., Disp: , Rfl:    flecainide (TAMBOCOR) 50 MG tablet, Take 1 tablet (50 mg total) by mouth 2 (two) times daily., Disp: 180 tablet, Rfl: 3   furosemide (LASIX) 40 MG tablet, Take 1 tablet (40 mg total) by mouth every other day. (Patient taking differently: Take 40 mg by mouth every other day. Taking Tuesday, Thursday, Saturday), Disp: 45 tablet, Rfl: 2   linaclotide (LINZESS) 290 MCG CAPS capsule, Take 290 mcg by mouth daily as needed., Disp: , Rfl:    lithium carbonate (LITHOBID) 300 MG ER tablet, Take 300 mg by mouth at bedtime., Disp: , Rfl:    loratadine (CLARITIN) 10 MG tablet, Take 10 mg by mouth daily as needed for allergies., Disp: , Rfl:    LORazepam (ATIVAN) 0.5 MG tablet, Take 0.5 mg by mouth every 8 (eight) hours as needed for anxiety., Disp: , Rfl:    ondansetron (ZOFRAN) 4 MG tablet, Take 4 mg by mouth once., Disp: , Rfl:    OXYGEN, Inhale 2 L/mL into the lungs See admin instructions., Disp: , Rfl:    Pediatric Multiple Vitamins (FLINTSTONES MULTIVITAMIN PO), Take 2 tablets by mouth daily., Disp: ,  Rfl:    potassium chloride SA (KLOR-CON M20) 20 MEQ tablet, Take 1 tablet (20 mEq total) by mouth every other day. (Patient taking differently: Take 20 mEq by mouth every other day. Taking Tuesday, Thursday, Saturday), Disp: 45 tablet, Rfl: 2   prazosin (MINIPRESS) 1 MG capsule, Take 1 mg by mouth at bedtime., Disp: , Rfl:    propranolol (INDERAL) 10 MG tablet, Take 1 tablet (10 mg total) by mouth at bedtime., Disp: 90 tablet, Rfl: 3   TRINTELLIX 10 MG TABS tablet, Take 10 mg by mouth daily., Disp: , Rfl:    Ubrogepant (UBRELVY) 100 MG TABS, Take 100 mg by mouth as needed (migraines)., Disp: , Rfl:    valsartan (DIOVAN) 80 MG tablet, Take 1 tablet (80 mg total) by mouth daily., Disp: 90 tablet, Rfl: 3   vitamin B-12 (CYANOCOBALAMIN) 1000 MCG tablet, Take 1,000 mcg by mouth daily., Disp: , Rfl:    methocarbamol (ROBAXIN) 750 MG tablet, Take 750 mg by mouth 4 (four) times daily. (Patient not taking: Reported on 03/05/2023), Disp: , Rfl:    pregabalin (LYRICA) 50 MG capsule, Take 50 mg by mouth 2 (two) times daily. Taking 50 mg in am, 100 mg at night, Disp: , Rfl:    promethazine (PHENERGAN) 12.5 MG tablet, Take 12.5 mg by mouth every 6 (six) hours as needed for nausea or vomiting. (Patient not taking: Reported on 02/27/2023), Disp: , Rfl:    risperiDONE (RISPERDAL) 3 MG tablet, Take 3 mg by mouth at bedtime. (Patient not taking: Reported on 03/05/2023), Disp: , Rfl:    sertraline (ZOLOFT) 100 MG tablet, Take 100 mg by mouth daily. (  Patient not taking: Reported on 03/05/2023), Disp: , Rfl:    traMADol (ULTRAM) 50 MG tablet, Take 25 mg by mouth every 6 (six) hours as needed. (Patient not taking: Reported on 03/05/2023), Disp: , Rfl:

## 2023-03-09 ENCOUNTER — Encounter: Payer: Self-pay | Admitting: Pulmonary Disease

## 2023-03-09 LAB — GENECONNECT MOLECULAR SCREEN: Genetic Analysis Overall Interpretation: NEGATIVE

## 2023-03-24 ENCOUNTER — Encounter: Payer: Self-pay | Admitting: Podiatry

## 2023-03-24 ENCOUNTER — Ambulatory Visit: Payer: Medicare Other | Admitting: Podiatry

## 2023-03-24 DIAGNOSIS — L84 Corns and callosities: Secondary | ICD-10-CM | POA: Diagnosis not present

## 2023-03-24 DIAGNOSIS — M79675 Pain in left toe(s): Secondary | ICD-10-CM

## 2023-03-24 DIAGNOSIS — E1142 Type 2 diabetes mellitus with diabetic polyneuropathy: Secondary | ICD-10-CM

## 2023-03-24 DIAGNOSIS — B351 Tinea unguium: Secondary | ICD-10-CM | POA: Diagnosis not present

## 2023-03-24 DIAGNOSIS — M79674 Pain in right toe(s): Secondary | ICD-10-CM

## 2023-03-24 DIAGNOSIS — M205X1 Other deformities of toe(s) (acquired), right foot: Secondary | ICD-10-CM

## 2023-03-24 NOTE — Progress Notes (Signed)
  Subjective:  Patient ID: Ellen Shaw, female    DOB: 1981-03-17,  MRN: [id number]  Chief Complaint  Patient presents with   [facility]    [facility], wants to talk about Diabetic shoes.  A1c 6.7,     43 y.o. female presents with the above complaint. History confirmed with patient. Patient presenting with pain related to dystrophic thickened elongated nails. Patient is unable to trim own nails related to nail dystrophy and/or mobility issues. Patient does have a history of T2DM. Patient does have some painful callus to the right 1st toe.  Does complain of neuropathy pain. Last A1c 6.7. She does present in CAM boot to right foot today, patient reports she's currently seeing Dr. Burt for midfoot arthritis and forefoot pain. She is also interested in diabetic shoes  Objective:  Physical Exam: warm to cool, good capillary refill, pedal skin atrophic nail exam onycholysis and dystrophic nails DP pulses palpable, PT pulses palpable, and protective sensation absent Left Foot:  Pain with palpation of nails due to elongation and dystrophic growth.  Right Foot: Pain with palpation of nails due to elongation and dystrophic growth. Hyperkeratotic tissue preulcerative callus present to the right 1st IPJ.  Assessment:   1. Pain due to onychomycosis of toenails of both feet   2. DM type 2 with diabetic peripheral neuropathy (HCC)   3. Hallux limitus of right foot   4. Pre-ulcerative calluses      Plan:  Patient was evaluated and treated and all questions answered.  #Right 1st toe interphalangeal joint preulcerative callus Symptomatic hyperkeratotic lesion x1 was safely debrided with a sterile #15 blade to patient's level of comfort without incident. We discussed preventative and palliative care of these lesions including supportive and accommodative shoegear, padding, prefabricated and custom molded accommodative orthoses, use of a pumice stone and lotions/creams daily.   #Onychomycosis with pain   -Nails palliatively debrided as below. -Educated on self-care  Procedure: Nail Debridement Rationale: Pain Type of Debridement: manual, sharp debridement. Instrumentation: Nail nipper, rotary burr. Number of Nails: 10  #Diabetes with neuropathy, hallux limitus -Patient educated on diabetes. Discussed proper diabetic foot care and discussed risks and complications of disease. Educated patient in depth on reasons to return to the office immediately should he/she discover anything concerning or new on the feet. All questions answered. Discussed proper shoes as well.  -Patient would benefit form diabetic shoes and inserts x3 due to extent of neuropathy, trophic changes, loss of pedal hair, pre-ulcerative callus with associated hallux limitus. Prescription written. -I certify that this diagnosis represents a distinct and separate diagnosis that requires evaluation and treatment separate from other procedures or diagnosis    Return in about 3 months (around 06/22/2023) for Diabetic Foot Care.         Ethan Saddler, DPM [facility] / [facility]

## 2023-04-14 ENCOUNTER — Encounter: Payer: Self-pay | Admitting: Pharmacist

## 2023-04-14 ENCOUNTER — Ambulatory Visit: Payer: Medicare Other | Attending: Cardiology | Admitting: Pharmacist

## 2023-04-14 VITALS — BP 118/81 | HR 66

## 2023-04-14 DIAGNOSIS — H9313 Tinnitus, bilateral: Secondary | ICD-10-CM | POA: Insufficient documentation

## 2023-04-14 DIAGNOSIS — I10 Essential (primary) hypertension: Secondary | ICD-10-CM | POA: Diagnosis present

## 2023-04-14 DIAGNOSIS — H903 Sensorineural hearing loss, bilateral: Secondary | ICD-10-CM | POA: Insufficient documentation

## 2023-04-14 DIAGNOSIS — Z8669 Personal history of other diseases of the nervous system and sense organs: Secondary | ICD-10-CM | POA: Insufficient documentation

## 2023-04-14 NOTE — Patient Instructions (Signed)
It was nice meeting you today  We would like to keep your blood pressure less than 130/80  Since you were feeling lightheaded, we can reduce your valsartan dose to 40mg  daily. You can cut the tablets you have now in half  Please continue your prazosin 1mg  and propranolol 10mg  as well  Please let us know if you have any questions or concerns  Laural Golden, PharmD, BCACP, CDCES, CPP [address], [address] [place], [place], [address] Phone: [phone], Fax: [phone]

## 2023-04-14 NOTE — Progress Notes (Signed)
Patient ID: [name]                 DOB: May 12, 1980                      MRN: [id number]     HPI: Ellen Shaw is a 43 y.o. female referred by Ellen Shaw to HTN clinic. PMH is significant for T2DM, HTN, asthma, COPD, obesity, SVTs, and respiratory failure on oxygen.  At last visit with Dr Ellen Shaw on 02/27/23 patient's BP was 150/85 and she was started on valsartan 80mg  once daily. Patient reported going through heavy stress at that time.   Presents today in good spirits. Reports she is no longer under such high stress. Is working with behavioral health. Had psych meds recently switched to vortioxetine and lorazepam.  Remains on low dose prazosin and propranolol.  Has noticed increasing lightheadedness with valsartan when she takes it. She has reduced her dose to 80mg  3x a week which has helped.  Current HTN meds:  Valsartan 80mg  daily Prazosin 1mg  daily Propranolol 10mg     BP goal: <130/80  Wt Readings from Last 3 Encounters:  03/05/23 (!) 310 lb 6.4 oz (140.8 kg)  02/27/23 (!) 319 lb 12.8 oz (145.1 kg)  09/09/22 (!) 324 lb 6.4 oz (147.1 kg)   BP Readings from Last 3 Encounters:  03/05/23 118/78  02/27/23 (!) 150/85  09/09/22 136/82   Pulse Readings from Last 3 Encounters:  03/05/23 71  02/27/23 67  09/09/22 83    Renal function: CrCl cannot be calculated (Patient's most recent lab result is older than the maximum 21 days allowed.).  Past Medical History:  Diagnosis Date   Alcohol abuse 02/21/2014   Allergic rhinitis 01/31/2008   Anxiety 07/22/2016   Arthritis 07/22/2016   Knees   Asthma 01/31/2008   Bell's palsy    age [age]   Bilateral leg edema 02/15/2020   Chest discomfort 03/19/2020   Complex atypical endometrial hyperplasia 08/01/2013   Folliculitis 10/12/2017   GERD (gastroesophageal reflux disease) 12/03/2012   History of Helicobacter pylori infection 03/03/2017   Hyperlipidemia    Hypertension    Intertrigo 09/01/2017   Lumbosacral  radiculopathy 09/01/2013   Major depressive disorder, recurrent episode (HCC) 05/02/2014   Medication overuse headache 07/27/2014   Migraine with aura and without status migrainosus, not intractable 07/27/2014   Follows with Dr. Weston Shaw in Neurology. HX pseudotumor cerebri   Mixed stress and urge urinary incontinence 08/12/2013   Morbid obesity (HCC)    NSVT (nonsustained ventricular tachycardia) (HCC) 03/19/2020   Obstructive sleep apnea on CPAP 12/27/2010   AHI=21.8 AutoPAP at 15-20 with an EPR=3   Other specific personality disorders (HCC)    Patellofemoral pain syndrome 10/12/2012   Posttraumatic stress disorder 02/04/2012   Prediabetes 06/30/2017   A1C 6.1 on 06/2017   Pseudotumor    PVC (premature ventricular contraction) 03/19/2020   Right bundle branch block 02/15/2020   Right cervical radiculopathy 07/18/2014   SVT (supraventricular tachycardia) (HCC) 07/15/2016   Tobacco use 12/03/2011   Umbilical cyst 06/02/2011   Urinary retention 08/19/2013   S/P interstim placement with Urology   Vulvovaginal candidiasis 03/03/2017    Current Outpatient Medications on File Prior to Visit  Medication Sig Dispense Refill   albuterol (VENTOLIN HFA) 108 (90 Base) MCG/ACT inhaler Inhale 2 puffs into the lungs every 6 (six) hours as needed for wheezing or shortness of breath. 8 g 6   atorvastatin (LIPITOR) 10 MG tablet  Take 10 mg by mouth daily.     Cholecalciferol (VITAMIN D3) 1.25 MG (50000 UT) CAPS Take 50,000 Units by mouth once a week.     diazepam (VALIUM) 5 MG tablet Take 5 mg by mouth 2 (two) times daily as needed.     diclofenac (VOLTAREN) 75 MG EC tablet Take 75 mg by mouth 2 (two) times daily.     EPINEPHrine 0.3 mg/0.3 mL IJ SOAJ injection Inject 0.3 mg into the muscle as needed for anaphylaxis.     FARXIGA 5 MG TABS tablet Take 5 mg by mouth every morning.     flecainide (TAMBOCOR) 50 MG tablet Take 1 tablet (50 mg total) by mouth 2 (two) times daily. 180 tablet 3   furosemide  (LASIX) 40 MG tablet Take 1 tablet (40 mg total) by mouth every other day. (Patient taking differently: Take 40 mg by mouth every other day. Taking Tuesday, Thursday, Saturday) 45 tablet 2   linaclotide (LINZESS) 290 MCG CAPS capsule Take 290 mcg by mouth daily as needed.     lithium carbonate (LITHOBID) 300 MG ER tablet Take 300 mg by mouth at bedtime.     loratadine (CLARITIN) 10 MG tablet Take 10 mg by mouth daily as needed for allergies.     LORazepam (ATIVAN) 0.5 MG tablet Take 0.5 mg by mouth every 8 (eight) hours as needed for anxiety.     methocarbamol (ROBAXIN) 750 MG tablet Take 750 mg by mouth 4 (four) times daily.     ondansetron (ZOFRAN) 4 MG tablet Take 4 mg by mouth once.     OXYGEN Inhale 2 L/mL into the lungs See admin instructions.     Pediatric Multiple Vitamins (FLINTSTONES MULTIVITAMIN PO) Take 2 tablets by mouth daily.     potassium chloride SA (KLOR-CON M20) 20 MEQ tablet Take 1 tablet (20 mEq total) by mouth every other day. (Patient taking differently: Take 20 mEq by mouth every other day. Taking Tuesday, Thursday, Saturday) 45 tablet 2   prazosin (MINIPRESS) 1 MG capsule Take 1 mg by mouth at bedtime.     pregabalin (LYRICA) 50 MG capsule Take 50 mg by mouth 2 (two) times daily. Taking 50 mg in am, 100 mg at night     promethazine (PHENERGAN) 12.5 MG tablet Take 12.5 mg by mouth every 6 (six) hours as needed for nausea or vomiting.     propranolol (INDERAL) 10 MG tablet Take 1 tablet (10 mg total) by mouth at bedtime. 90 tablet 3   TRINTELLIX 10 MG TABS tablet Take 10 mg by mouth daily.     Ubrogepant (UBRELVY) 100 MG TABS Take 100 mg by mouth as needed (migraines).     valsartan (DIOVAN) 80 MG tablet Take 1 tablet (80 mg total) by mouth daily. 90 tablet 3   vitamin B-12 (CYANOCOBALAMIN) 1000 MCG tablet Take 1,000 mcg by mouth daily.     No current facility-administered medications on file prior to visit.    Allergies  Allergen Reactions   Bee Venom Shortness Of  Breath and Swelling   Ciprofloxacin Anaphylaxis, Shortness Of Breath and Rash   Clindamycin Shortness Of Breath   Esomeprazole Magnesium Hives and Shortness Of Breath   Nitrofurantoin Shortness Of Breath   Oxycodone Other (See Comments)    Other: "I passed out." Other: convulsions "shaking" Other reaction(s): Dizziness, Hypotension, Wheezing (ALLERGY/intolerance)     Sulfa Antibiotics Shortness Of Breath and Rash   Trimethoprim     Other reaction(s): Rash, Rash, Shortness of  breath   Citrus Hives   Doxycycline Other (See Comments)    Muscle spasms  Bad muscle spasms    Fluticasone Rash and Other (See Comments)    "Headache and nosebleed"     Penicillins Rash   Buspirone    Fentanyl Other (See Comments)    "I pass out."  *tolerated 06/11/2021 and 01/09/22 intraoperatively without complications*     Lexapro [Escitalopram]    Paliperidone Other (See Comments)    Other reaction(s): Dystonia   Pantoprazole Hives    Relieved with Benadryl     Assessment/Plan:  1. Hypertension -   Patient BP controlled today at 114/78 and rechecked at 118/78. Likely experiencing hypotensive symptoms when  she takes valsartan 80mg . Will reduce down to 40mg  daily to see if patient can tolerate. She is agreeable.  Continue prazosin 1mg  nightly Continue propranolol 10mg  daily Reduce valsartan to 40mg  daily F/U as needed  Laural Golden, PharmD, BCACP, CDCES, CPP [address] [place], [place], [address] Phone: [phone], Fax: [phone]

## 2023-05-21 ENCOUNTER — Ambulatory Visit: Payer: Medicare Other | Attending: Cardiology | Admitting: Cardiology

## 2023-05-21 ENCOUNTER — Encounter: Payer: Self-pay | Admitting: Cardiology

## 2023-05-21 VITALS — BP 110/72 | HR 84 | Ht 69.0 in | Wt 299.8 lb

## 2023-05-21 DIAGNOSIS — I493 Ventricular premature depolarization: Secondary | ICD-10-CM | POA: Diagnosis present

## 2023-05-21 DIAGNOSIS — I4729 Other ventricular tachycardia: Secondary | ICD-10-CM | POA: Diagnosis present

## 2023-05-21 DIAGNOSIS — I471 Supraventricular tachycardia, unspecified: Secondary | ICD-10-CM | POA: Insufficient documentation

## 2023-05-21 DIAGNOSIS — G4733 Obstructive sleep apnea (adult) (pediatric): Secondary | ICD-10-CM | POA: Diagnosis present

## 2023-05-21 NOTE — Progress Notes (Signed)
 Cardiology Office Note:    Date:  05/21/2023   ID:  Ellen Shaw, DOB 03/12/1981, MRN [id number]  PCP:  Gus Height, PA  Cardiologist:  Thomasene Ripple, DO  Electrophysiologist:  None   Referring MD: Mikki Santee Key, *   " I am doing well"  History of Present Illness:    Ellen Shaw is a 43 y.o. female with a hx of chronic respiratory failure now on oxygen managed by her primary care doctor, hypertension, morbid obesity, nonsustained ventricular tachycardia, paroxysmal SVT on the monitor, recurrent palpitations here today for follow-up visit.  At her last visit her blood pressure we started her on valsartan - but she never took it   No other complaints at this time.  Reports she has a lot of questions: About understanding her heart disease asking me for specific diagnosis I was able to answer.  Past Medical History:  Diagnosis Date   Alcohol abuse 02/21/2014   Allergic rhinitis 01/31/2008   Anxiety 07/22/2016   Arthritis 07/22/2016   Knees   Asthma 01/31/2008   Bell's palsy    age 47-14   Bilateral leg edema 02/15/2020   Chest discomfort 03/19/2020   Complex atypical endometrial hyperplasia 08/01/2013   Folliculitis 10/12/2017   GERD (gastroesophageal reflux disease) 12/03/2012   History of Helicobacter pylori infection 03/03/2017   Hyperlipidemia    Hypertension    Intertrigo 09/01/2017   Lumbosacral radiculopathy 09/01/2013   Major depressive disorder, recurrent episode (HCC) 05/02/2014   Medication overuse headache 07/27/2014   Migraine with aura and without status migrainosus, not intractable 07/27/2014   Follows with Dr. Weston Settle in Neurology. HX pseudotumor cerebri   Mixed stress and urge urinary incontinence 08/12/2013   Morbid obesity (HCC)    NSVT (nonsustained ventricular tachycardia) (HCC) 03/19/2020   Obstructive sleep apnea on CPAP 12/27/2010   AHI=21.8 AutoPAP at 15-20 with an EPR=3   Other specific personality disorders (HCC)     Patellofemoral pain syndrome 10/12/2012   Posttraumatic stress disorder 02/04/2012   Prediabetes 06/30/2017   A1C 6.1 on 06/2017   Pseudotumor    PVC (premature ventricular contraction) 03/19/2020   Right bundle branch block 02/15/2020   Right cervical radiculopathy 07/18/2014   SVT (supraventricular tachycardia) (HCC) 07/15/2016   Tobacco use 12/03/2011   Umbilical cyst 06/02/2011   Urinary retention 08/19/2013   S/P interstim placement with Urology   Vulvovaginal candidiasis 03/03/2017    Past Surgical History:  Procedure Laterality Date   BLADDER SURGERY     CHOLECYSTECTOMY N/A 01/09/2022   Procedure: LAPAROSCOPIC CHOLECYSTECTOMY;  Surgeon: Rodman Pickle, MD;  Location: [facility];  Service: General;  Laterality: N/A;   EYELID LACERATION REPAIR Bilateral    NASAL SEPTUM SURGERY     TONSILLECTOMY AND ADENOIDECTOMY     VAGINAL HYSTERECTOMY  2015    Current Medications: Current Meds  Medication Sig   albuterol (VENTOLIN HFA) 108 (90 Base) MCG/ACT inhaler Inhale 2 puffs into the lungs every 6 (six) hours as needed for wheezing or shortness of breath.   atorvastatin (LIPITOR) 10 MG tablet Take 10 mg by mouth daily.   Cholecalciferol (VITAMIN D3) 1.25 MG (50000 UT) CAPS Take 50,000 Units by mouth once a week.   diclofenac (VOLTAREN) 75 MG EC tablet Take 75 mg by mouth 2 (two) times daily.   EPINEPHrine 0.3 mg/0.3 mL IJ SOAJ injection Inject 0.3 mg into the muscle as needed for anaphylaxis.   FARXIGA 5 MG TABS tablet Take 5 mg by mouth every  morning.   flecainide (TAMBOCOR) 50 MG tablet Take 1 tablet (50 mg total) by mouth 2 (two) times daily.   furosemide (LASIX) 40 MG tablet Take 1 tablet (40 mg total) by mouth every other day. (Patient taking differently: Take 40 mg by mouth every other day. Taking Tuesday, Thursday, Saturday)   linaclotide (LINZESS) 290 MCG CAPS capsule Take 290 mcg by mouth daily as needed.   lithium carbonate (LITHOBID) 300 MG ER tablet Take 300 mg by  mouth at bedtime.   loratadine (CLARITIN) 10 MG tablet Take 10 mg by mouth daily as needed for allergies.   LORazepam (ATIVAN) 0.5 MG tablet Take 0.5 mg by mouth every 8 (eight) hours as needed for anxiety.   ondansetron (ZOFRAN) 4 MG tablet Take 4 mg by mouth once.   OXYGEN Inhale 2 L/mL into the lungs See admin instructions.   Pediatric Multiple Vitamins (FLINTSTONES MULTIVITAMIN PO) Take 2 tablets by mouth daily.   potassium chloride SA (KLOR-CON M20) 20 MEQ tablet Take 1 tablet (20 mEq total) by mouth every other day. (Patient taking differently: Take 20 mEq by mouth every other day. Taking Tuesday, Thursday, Saturday)   prazosin (MINIPRESS) 1 MG capsule Take 1 mg by mouth at bedtime.   pregabalin (LYRICA) 50 MG capsule Take 50 mg by mouth 2 (two) times daily. Taking 50 mg in am, 100 mg at night   propranolol (INDERAL) 10 MG tablet Take 1 tablet (10 mg total) by mouth at bedtime.   Ubrogepant (UBRELVY) 100 MG TABS Take 100 mg by mouth as needed (migraines).   vitamin B-12 (CYANOCOBALAMIN) 1000 MCG tablet Take 1,000 mcg by mouth daily.   vortioxetine HBr (TRINTELLIX) 20 MG TABS tablet Take 1 tablet (20 mg total) by mouth daily.     Allergies:   Bee venom, Ciprofloxacin, Clindamycin, Esomeprazole magnesium, Nitrofurantoin, Oxycodone, Sulfa antibiotics, Trimethoprim, Citrus, Doxycycline, Fluticasone, Penicillins, Buspirone, Fentanyl, Lexapro [escitalopram], Paliperidone, and Pantoprazole   Social History   Socioeconomic History   Marital status: Single    Spouse name: Not on file   Number of children: Not on file   Years of education: Not on file   Highest education level: Not on file  Occupational History   Not on file  Tobacco Use   Smoking status: Former    Current packs/day: 0.00    Types: Cigarettes    Quit date: 01/19/2020    Years since quitting: 3.3   Smokeless tobacco: Never  Vaping Use   Vaping status: Never Used  Substance and Sexual Activity   Alcohol use: Not  Currently   Drug use: No   Sexual activity: Not on file  Other Topics Concern   Not on file  Social History Narrative   Not on file   Social Drivers of Health   Financial Resource Strain: Not on file  Food Insecurity: Not on file  Transportation Needs: Not on file  Physical Activity: Not on file  Stress: Not on file  Social Connections: Not on file     Family History: The patient's family history includes Atrial fibrillation in her father; COPD in her father; Congestive Heart Failure in her father; Glaucoma in her father; Heart attack in her maternal grandmother; High Cholesterol in her mother; Hypertension in her father; Kidney disease in her father; Lung cancer in her father and paternal grandmother; Macular degeneration in her father; Stroke in her father.  ROS:   Review of Systems  Constitution: Negative for decreased appetite, fever and weight gain.  HENT:  Negative for congestion, ear discharge, hoarse voice and sore throat.   Eyes: Negative for discharge, redness, vision loss in right eye and visual halos.  Cardiovascular: Negative for chest pain, dyspnea on exertion, leg swelling, orthopnea and palpitations.  Respiratory: Negative for cough, hemoptysis, shortness of breath and snoring.   Endocrine: Negative for heat intolerance and polyphagia.  Hematologic/Lymphatic: Negative for bleeding problem. Does not bruise/bleed easily.  Skin: Negative for flushing, nail changes, rash and suspicious lesions.  Musculoskeletal: Negative for arthritis, joint pain, muscle cramps, myalgias, neck pain and stiffness.  Gastrointestinal: Negative for abdominal pain, bowel incontinence, diarrhea and excessive appetite.  Genitourinary: Negative for decreased libido, genital sores and incomplete emptying.  Neurological: Negative for brief paralysis, focal weakness, headaches and loss of balance.  Psychiatric/Behavioral: Negative for altered mental status, depression and suicidal ideas.   Allergic/Immunologic: Negative for HIV exposure and persistent infections.    EKGs/Labs/Other Studies Reviewed:    The following studies were reviewed today:   EKG:    CCTA  Aorta: Normal size.  No calcifications.  No dissection.   Aortic Valve:  Trileaflet.  No calcifications.   Coronary Arteries:  Normal coronary origin.  Right dominance.   RCA is a large dominant artery that gives rise to PDA and PLVB. There is no plaque.   Left main is a large artery that gives rise to LAD and LCX arteries.   LAD is a large vessel that has no plaque.   LCX is a non-dominant artery that gives rise to one large OM1 branch. There is no plaque.   Other findings:   Normal pulmonary vein drainage into the left atrium.   Normal left atrial appendage without a thrombus.   Normal size of the pulmonary artery.   IMPRESSION: 1. Coronary calcium score of 0. This was 0 percentile for age and sex matched control.   2. Normal coronary origin with right dominance.   3. No evidence of CAD.     Zio monitor The patient wore the monitor for 14 days starting February 15, 2020   . Indication: Palpitations   The minimum heart rate was 47 bpm, maximum heart rate was 240 bpm, and average heart rate was 85 bpm. Predominant underlying rhythm was Sinus Rhythm.  First-degree AV block was present.   9 Ventricular Tachycardia runs occurred, the run with the fastest interval lasting 5 beats with a maximum rate of 240 bpm, the longest lasting 5 beats with an average rate of 184 bpm.   Premature atrial complexes were rare less than 1%. Premature Ventricular complexes rare less than 1%.   No pauses, No AV block and no atrial fibrillation present. 14 patient triggered events and 13 diary events associated with premature ventricular complex.   Conclusion: This study is remarkable for the following:                             1.  Nonsustained ventricular tachycardia                             2.  Rare  symptomatic premature ventricular complex.   Transthoracic Echocardiogram IMPRESSIONS  1. Left ventricular ejection fraction, by estimation, is 60 to 65%. The left ventricle has normal function. The left ventricle has no regional wall motion abnormalities. Left ventricular diastolic parameters were normal.   2. Right ventricular systolic function is normal. The right ventricular size is normal.  3. The mitral valve is normal in structure. No evidence of mitral valve regurgitation. No evidence of mitral stenosis.   4. The aortic valve is normal in structure. Aortic valve regurgitation is not visualized. No aortic stenosis is present.   5. The inferior vena cava is normal in size with greater than 50% respiratory variability, suggesting right atrial pressure of 3 mmHg.   FINDINGS   Left Ventricle: Left ventricular ejection fraction, by estimation, is 60 to 65%. The left ventricle has normal function. The left ventricle has no regional wall motion abnormalities. Definity contrast agent was given IV to delineate the left ventricular   endocardial borders. The left ventricular internal cavity size was normal in size. There is no left ventricular hypertrophy. Left ventricular diastolic parameters were normal.   Right Ventricle: The right ventricular size is normal. No increase in right ventricular wall thickness. Right ventricular systolic function is  normal.   Left Atrium: Left atrial size was normal in size.   Right Atrium: Right atrial size was normal in size.   Pericardium: There is no evidence of pericardial effusion.   Mitral Valve: The mitral valve is normal in structure. No evidence of  mitral valve regurgitation. No evidence of mitral valve stenosis.   Tricuspid Valve: The tricuspid valve is normal in structure. Tricuspid  valve regurgitation is trivial. No evidence of tricuspid stenosis.   Aortic Valve: The aortic valve is normal in structure. Aortic valve  regurgitation is not  visualized. No aortic stenosis is present.   Pulmonic Valve: The pulmonic valve was normal in structure. Pulmonic valve  regurgitation is not visualized. No evidence of pulmonic stenosis.   Aorta: The aortic root is normal in size and structure.   Venous: The inferior vena cava is normal in size with greater than 50%  respiratory variability, suggesting right atrial pressure of 3 mmHg.   IAS/Shunts: No atrial level shunt detected by color flow Doppler.     ZIO monitor 2022 Patch Wear Time:  13 days and 14 hours starting November 12, 2020. Indication palpitation   Patient had a min HR of 51 bpm, max HR of 158 bpm, and avg HR of 78 bpm. Predominant underlying rhythm was Sinus Rhythm. First Degree AV Block was present.    8 Supraventricular Tachycardia runs occurred, the run with the fastest interval lasting 5 beats with a max rate of 158 bpm (avg 128 bpm); the run with the fastest interval was  also the longest. Isolated SVEs were rare (<1.0%), SVE Couplets were rare (<1.0%), and SVE Triplets were rare (<1.0%). Isolated VEs were rare (<1.0%), VE Couplets were rare (<1.0%), and no VE Triplets were present. Ventricular Bigeminy and Trigeminy  were present.   Symptoms were associated with sinus rhythm and PVC.   No atrial fibrillation.   Conclusion: This study is remarkable for paroxysmal supraventricular tachycardia    Recent Labs: 02/27/2023: ALT 10; BUN 8; Creatinine, Ser 0.52; Magnesium 2.1; Potassium 4.4; Sodium 140  Recent Lipid Panel No results found for: "CHOL", "TRIG", "HDL", "CHOLHDL", "VLDL", "LDLCALC", "LDLDIRECT"  Physical Exam:    VS:  BP 110/72 (BP Location: Right Arm, Patient Position: Sitting, Cuff Size: Normal)   Pulse 84   Ht 5\' 9"  (1.753 m)   Wt 299 lb 12.8 oz (136 kg)   SpO2 95%   BMI 44.27 kg/m     Wt Readings from Last 3 Encounters:  05/21/23 299 lb 12.8 oz (136 kg)  03/05/23 (!) 310 lb 6.4 oz (140.8 kg)  02/27/23 (!) 319 lb 12.8 oz (145.1 kg)      GEN: Well nourished, well developed in no acute distress HEENT: Normal NECK: No JVD; No carotid bruits LYMPHATICS: No lymphadenopathy CARDIAC: S1S2 noted,RRR, no murmurs, rubs, gallops RESPIRATORY:  Clear to auscultation without rales, wheezing or rhonchi  ABDOMEN: Soft, non-tender, non-distended, +bowel sounds, no guarding. EXTREMITIES: No edema, No cyanosis, no clubbing MUSCULOSKELETAL:  No deformity  SKIN: Warm and dry NEUROLOGIC:  Alert and oriented x 3, non-focal PSYCHIATRIC:  Normal affect, good insight  ASSESSMENT:    1. SVT (supraventricular tachycardia) (HCC)   2. NSVT (nonsustained ventricular tachycardia) (HCC)   3. PVC (premature ventricular contraction)   4. Obstructive sleep apnea on CPAP   5. Morbid obesity (HCC)     PLAN:    Hypertension-blood pressure on the lower side today.  She never started the valsartan.  So we will stop it.   PSVT-will continue patient on flecainide and propranolol.  She is working with her psychiatrist to optimize her antidepressive medications.  The patient understands the need to lose weight with diet and exercise. We have discussed specific strategies for this.  Continued CPAP use.  Hyperlipidemia - continue statin use   Blood pressure is acceptable, continue with current antihypertensive regimen.  The patient is in agreement with the above plan. The patient left the office in stable condition.  The patient will follow up in 6 months   Medication Adjustments/Labs and Tests Ordered: Current medicines are reviewed at length with the patient today.  Concerns regarding medicines are outlined above.  No orders of the defined types were placed in this encounter.  No orders of the defined types were placed in this encounter.   Patient Instructions  Medication Instructions:  Your physician has recommended you make the following change in your medication:  STOP: Valsartan  *If you need a refill on your cardiac medications  before your next appointment, please call your pharmacy*    Follow-Up: At [facility], you and your health needs are our priority.  As part of our continuing mission to provide you with exceptional heart care, we have created designated Provider Care Teams.  These Care Teams include your primary Cardiologist (physician) and Advanced Practice Providers (APPs -  Physician Assistants and Nurse Practitioners) who all work together to provide you with the care you need, when you need it.   Your next appointment:   6 month(s)  Provider:   Thomasene Ripple, DO       Adopting a Healthy Lifestyle.  Know what a healthy weight is for you (roughly BMI <25) and aim to maintain this   Aim for 7+ servings of fruits and vegetables daily   65-80+ fluid ounces of water or unsweet tea for healthy kidneys   Limit to max 1 drink of alcohol per day; avoid smoking/tobacco   Limit animal fats in diet for cholesterol and heart health - choose grass fed whenever available   Avoid highly processed foods, and foods high in saturated/trans fats   Aim for low stress - take time to unwind and care for your mental health   Aim for 150 min of moderate intensity exercise weekly for heart health, and weights twice weekly for bone health   Aim for 7-9 hours of sleep daily   When it comes to diets, agreement about the perfect plan isnt easy to find, even among the experts. Experts at the [employer] developed an idea known as the Healthy  Eating Plate. Just imagine a plate divided into logical, healthy portions.   The emphasis is on diet quality:   Load up on vegetables and fruits - one-half of your plate: Aim for color and variety, and remember that potatoes dont count.   Go for whole grains - one-quarter of your plate: Whole wheat, barley, wheat berries, quinoa, oats, brown rice, and foods made with them. If you want pasta, go with whole wheat pasta.   Protein power - one-quarter  of your plate: Fish, chicken, beans, and nuts are all healthy, versatile protein sources. Limit red meat.   The diet, however, does go beyond the plate, offering a few other suggestions.   Use healthy plant oils, such as olive, canola, soy, corn, sunflower and peanut. Check the labels, and avoid partially hydrogenated oil, which have unhealthy trans fats.   If youre thirsty, drink water. Coffee and tea are good in moderation, but skip sugary drinks and limit milk and dairy products to one or two daily servings.   The type of carbohydrate in the diet is more important than the amount. Some sources of carbohydrates, such as vegetables, fruits, whole grains, and beans-are healthier than others.   Finally, stay active  Signed, Thomasene Ripple, DO  05/21/2023 1:47 PM    [facility]

## 2023-05-21 NOTE — Patient Instructions (Signed)
 Medication Instructions:  Your physician has recommended you make the following change in your medication:  STOP: Valsartan  *If you need a refill on your cardiac medications before your next appointment, please call your pharmacy*    Follow-Up: At [facility], you and your health needs are our priority.  As part of our continuing mission to provide you with exceptional heart care, we have created designated Provider Care Teams.  These Care Teams include your primary Cardiologist (physician) and Advanced Practice Providers (APPs -  Physician Assistants and Nurse Practitioners) who all work together to provide you with the care you need, when you need it.   Your next appointment:   6 month(s)  Provider:   Thomasene Ripple, DO

## 2023-05-26 ENCOUNTER — Ambulatory Visit: Payer: Medicare Other

## 2023-05-26 DIAGNOSIS — M205X1 Other deformities of toe(s) (acquired), right foot: Secondary | ICD-10-CM

## 2023-05-26 DIAGNOSIS — L84 Corns and callosities: Secondary | ICD-10-CM

## 2023-05-26 DIAGNOSIS — E1142 Type 2 diabetes mellitus with diabetic polyneuropathy: Secondary | ICD-10-CM

## 2023-05-26 DIAGNOSIS — M2141 Flat foot [pes planus] (acquired), right foot: Secondary | ICD-10-CM

## 2023-05-26 NOTE — Progress Notes (Signed)
 Patient presents to the office today for diabetic shoe and insole measuring.  Patient was measured with brannock device to determine size and width for 1 pair of extra depth shoes and foam casted for 3 pair of insoles.   Documentation of medical necessity will be sent to patient's treating diabetic doctor to verify and sign.   Patient's diabetic provider: Talitha Givens PA / Junious Dresser MD  Shoes and insoles will be ordered at that time and patient will be notified for an appointment for fitting when they arrive.   Shoe size (per patient): 11 Brannock measurement: 9.5 Shoe choice:   [id number] / [id number] Shoe size ordered: 10WD   ABN / PPW signed

## 2023-06-03 ENCOUNTER — Ambulatory Visit: Payer: Medicare Other | Admitting: Pulmonary Disease

## 2023-06-03 ENCOUNTER — Encounter: Payer: Self-pay | Admitting: Pulmonary Disease

## 2023-06-03 ENCOUNTER — Other Ambulatory Visit (HOSPITAL_COMMUNITY): Payer: Self-pay

## 2023-06-03 ENCOUNTER — Telehealth: Payer: Self-pay

## 2023-06-03 VITALS — BP 117/84 | HR 74 | Ht 69.0 in | Wt 299.0 lb

## 2023-06-03 DIAGNOSIS — G4733 Obstructive sleep apnea (adult) (pediatric): Secondary | ICD-10-CM | POA: Diagnosis not present

## 2023-06-03 DIAGNOSIS — J4520 Mild intermittent asthma, uncomplicated: Secondary | ICD-10-CM | POA: Diagnosis not present

## 2023-06-03 DIAGNOSIS — E6689 Other obesity not elsewhere classified: Secondary | ICD-10-CM | POA: Diagnosis not present

## 2023-06-03 DIAGNOSIS — E662 Morbid (severe) obesity with alveolar hypoventilation: Secondary | ICD-10-CM

## 2023-06-03 DIAGNOSIS — J9611 Chronic respiratory failure with hypoxia: Secondary | ICD-10-CM | POA: Diagnosis not present

## 2023-06-03 MED ORDER — BUDESONIDE 0.5 MG/2ML IN SUSP: 0.5000 mg | Freq: Two times a day (BID) | RESPIRATORY_TRACT | 6 refills | Status: DC

## 2023-06-03 NOTE — Telephone Encounter (Signed)
*  Pulm  Pharmacy Patient Advocate Encounter   Received notification from CoverMyMeds that prior authorization for Budesonide 0.5MG /2ML suspension  is required/requested.   Insurance verification completed.   The patient is insured through [facility] .   Per test claim: PA required; PA submitted to above mentioned insurance via CoverMyMeds Key/confirmation #/EOC [id number] Status is pending

## 2023-06-03 NOTE — Addendum Note (Signed)
 Addended by: Unk Pinto R on: 06/03/2023 11:48 AM   Modules accepted: Orders

## 2023-06-03 NOTE — Patient Instructions (Addendum)
 Start budesonide 0.5mg  nebulizter treatments twice daily for the next month, can stop and use as needed if feeling better with the cough and chest tightness  Continue albuterol nebulizer treatments as needed  We will walk you today to titrate you for a portable oxygen concentrator  Continue CPAP with new mask  Continue supplemental oxygen 2L rest and with exertion.  Follow up in 4 months, call sooner if needed

## 2023-06-03 NOTE — Progress Notes (Addendum)
 Synopsis: Referred in November 2022 for chronic hypoxemic respiratory failure by Graylon Gunning, PA  Subjective:   PATIENT ID: [name] GENDER: female DOB: [birth year], MRN: [id number]  HPI  Chief Complaint  Patient presents with   Follow-up   Jamilette Suchocki is a 43 year old woman, former smoker with obesity, chronic hypoxemic respiratory failure and obstructive sleep apnea who returns to pulmonary clinic for follow up.   She has been experiencing issues with her CPAP mask and supplies. A shipment was sent on January 8th but was not found at her post office box. After three weeks of communication with the post office and the supply company, she received a replacement last week. She used the new mask for one night and found it effective. However, she has been unable to stay at her apartment due to smoke from marijuana in the hallways, which has forced her to stay with her mother and sister. Currently, she is sleeping in a chair at their place as they have recently moved and do not have a space for her.  She is actively seeking to move to a different apartment due to the smoke issue. Her current apartment is on the second floor, and she has difficulty with the stairs, needing to stop and rest due to shortness of breath, especially when ascending. She uses two liters of oxygen when walking and finds it challenging to manage with her current setup, especially when walking her dog. She inquires about portable oxygen concentrators as she is going through her current oxygen supply quickly. She uses [facility] for her oxygen needs. She reports slight swelling in her legs and uses oxygen at two liters at rest.  She had the flu last month and continues to experience chest tightness, which is slowly improving. She uses albuterol as needed but finds it insufficient to manage her symptoms. She has tried other inhalers in the past, including a purple powder inhaler, which she found uncomfortable.  She reports occasional mucus production that feels stuck in her throat.   OV 03/05/23 She presents with issues related to her CPAP machine. She reports discomfort with the current mask, which covers both the nose and mouth, and triggers traumatic memories. The patient expresses a preference for a mask that only covers the nose, similar to one she had used previously. She also reports occasional cough and shortness of breath, which she attributes to not using her oxygen as much due to helping her family move. The patient has been actively trying to lose weight and has been successful in doing so. She also reports a recent foot fracture, for which she is currently wearing a boot.  DME company [employer]   OV 02/25/2022 She has been losing weight. She is going to the gym and walking on the treadmill. She had a gallbladder surgery in October and was not eating a lot during that period of time. She does report wheezing on days with cold air like today.   She is using 1-2L of oxygen.   OV 06/04/21 She has done well since last visit. She is working with a nutritionist on weight loss but has not lost any weight since the beginning of the year.   She has an upcoming surgery scheduled with ENT for deviated septum and nasal obstruction.   She is intolerant of CPAP therapy due to ear fullness for her OSA.   OV 03/04/2021 HRCT Chest from 02/05/21 did not show any parenchymal or airway issues. PFTs today  show mild restrictive defect with normal diffusion capacity. There was slight improvement in FEV1, FVC, TLC and DLCO overall compared to prior PFTs from [facility].   She continues to have exertional dyspnea. Denies cough, wheezing or chest tightness. She reports episodes of her heart rate and a decline in SpO2. She does continue to have palpations despite being on flecainide and propranolol per cardiology.   She is being evaluated by ENT in [place], [place] who is planning to operate on bilateral bone spurs  of her nose.   She has not returned to smoking. She started going to the weight management clinic last month.  OV 01/16/21 She was started on oxygen earlier this year by her primary care team due to SpO2 readings in the mid 80s on room air. She is currently on 1-2L of supplemental oxygen. She is using CPAP therapy at night and is followed at [facility] for her sleep apnea.   The oxygen need is new since being sick with covid 19 in 11/2019 and again 07/2020.   She had pulmonary function tests performed 07/05/20 which showed FEV1 2.58L (74%), FVC 3.12L (72%), ratio 83, TLC 4.14L (71%), DLCO 30.99 (74%).  She was evaluated by Dr. Eual Fines of pulmonary at [facility] on 07/12/20. She was referred to pulmonary rehab at [facility] and is waiting to be enrolled in this program. They trialed her on anoro ellipta but she did not tolerate this medicine due to chest discomfort. They recommended weight loss.  She is seeing a nutritionist at this time to work on weight loss.   She quit smoking 04/2020 and smoked 1-2 packs over the past 10 years.   Past Medical History:  Diagnosis Date   Alcohol abuse 02/21/2014   Allergic rhinitis 01/31/2008   Anxiety 07/22/2016   Arthritis 07/22/2016   Knees   Asthma 01/31/2008   Bell's palsy    age [age]   Bilateral leg edema 02/15/2020   Chest discomfort 03/19/2020   Complex atypical endometrial hyperplasia 08/01/2013   Folliculitis 10/12/2017   GERD (gastroesophageal reflux disease) 12/03/2012   History of Helicobacter pylori infection 03/03/2017   Hyperlipidemia    Hypertension    Intertrigo 09/01/2017   Lumbosacral radiculopathy 09/01/2013   Major depressive disorder, recurrent episode (HCC) 05/02/2014   Medication overuse headache 07/27/2014   Migraine with aura and without status migrainosus, not intractable 07/27/2014   Follows with Dr. Weston Settle in Neurology. HX pseudotumor cerebri   Mixed stress and urge urinary incontinence 08/12/2013    Morbid obesity (HCC)    NSVT (nonsustained ventricular tachycardia) (HCC) 03/19/2020   Obstructive sleep apnea on CPAP 12/27/2010   AHI=21.8 AutoPAP at 15-20 with an EPR=3   Other specific personality disorders (HCC)    Patellofemoral pain syndrome 10/12/2012   Posttraumatic stress disorder 02/04/2012   Prediabetes 06/30/2017   A1C 6.1 on 06/2017   Pseudotumor    PVC (premature ventricular contraction) 03/19/2020   Right bundle branch block 02/15/2020   Right cervical radiculopathy 07/18/2014   SVT (supraventricular tachycardia) (HCC) 07/15/2016   Tobacco use 12/03/2011   Umbilical cyst 06/02/2011   Urinary retention 08/19/2013   S/P interstim placement with Urology   Vulvovaginal candidiasis 03/03/2017     Family History  Problem Relation Age of Onset   COPD Father    Lung cancer Father    Congestive Heart Failure Father    Hypertension Father    Kidney disease Father    Atrial fibrillation Father    Macular degeneration  Father    Glaucoma Father    Stroke Father    High Cholesterol Mother    Heart attack Maternal Grandmother    Lung cancer Paternal Grandmother      Social History   Socioeconomic History   Marital status: Single    Spouse name: Not on file   Number of children: Not on file   Years of education: Not on file   Highest education level: Not on file  Occupational History   Not on file  Tobacco Use   Smoking status: Former    Current packs/day: 0.00    Types: Cigarettes    Quit date: 01/19/2020    Years since quitting: 3.3   Smokeless tobacco: Never  Vaping Use   Vaping status: Never Used  Substance and Sexual Activity   Alcohol use: Not Currently   Drug use: No   Sexual activity: Not on file  Other Topics Concern   Not on file  Social History Narrative   Not on file   Social Drivers of Health   Financial Resource Strain: Not on file  Food Insecurity: Not on file  Transportation Needs: Not on file  Physical Activity: Not on file   Stress: Not on file  Social Connections: Not on file  Intimate Partner Violence: Not on file     Allergies  Allergen Reactions   Bee Venom Shortness Of Breath and Swelling   Ciprofloxacin Anaphylaxis, Shortness Of Breath and Rash   Clindamycin Shortness Of Breath   Esomeprazole Magnesium Hives and Shortness Of Breath   Nitrofurantoin Shortness Of Breath   Oxycodone Other (See Comments)    Other: "I passed out." Other: convulsions "shaking" Other reaction(s): Dizziness, Hypotension, Wheezing (ALLERGY/intolerance)     Sulfa Antibiotics Shortness Of Breath and Rash   Trimethoprim     Other reaction(s): Rash, Rash, Shortness of breath   Citrus Hives   Doxycycline Other (See Comments)    Muscle spasms  Bad muscle spasms    Fluticasone Rash and Other (See Comments)    "Headache and nosebleed"     Penicillins Rash   Buspirone    Fentanyl Other (See Comments)    "I pass out."  *tolerated 06/11/2021 and 01/09/22 intraoperatively without complications*     Lexapro [Escitalopram]    Paliperidone Other (See Comments)    Other reaction(s): Dystonia   Pantoprazole Hives    Relieved with Benadryl     Outpatient Medications Prior to Visit  Medication Sig Dispense Refill   albuterol (VENTOLIN HFA) 108 (90 Base) MCG/ACT inhaler Inhale 2 puffs into the lungs every 6 (six) hours as needed for wheezing or shortness of breath. 8 g 6   atorvastatin (LIPITOR) 10 MG tablet Take 10 mg by mouth daily.     Cholecalciferol (VITAMIN D3) 1.25 MG (50000 UT) CAPS Take 50,000 Units by mouth once a week.     EPINEPHrine 0.3 mg/0.3 mL IJ SOAJ injection Inject 0.3 mg into the muscle as needed for anaphylaxis.     FARXIGA 5 MG TABS tablet Take 5 mg by mouth every morning.     flecainide (TAMBOCOR) 50 MG tablet Take 1 tablet (50 mg total) by mouth 2 (two) times daily. 180 tablet 3   furosemide (LASIX) 40 MG tablet Take 1 tablet (40 mg total) by mouth every other day. (Patient taking differently: Take  40 mg by mouth every other day. Taking Tuesday, Thursday, Saturday) 45 tablet 2   linaclotide (LINZESS) 290 MCG CAPS capsule Take 290 mcg by  mouth daily as needed.     lithium carbonate (LITHOBID) 300 MG ER tablet Take 300 mg by mouth at bedtime.     loratadine (CLARITIN) 10 MG tablet Take 10 mg by mouth daily as needed for allergies.     LORazepam (ATIVAN) 0.5 MG tablet Take 0.5 mg by mouth every 8 (eight) hours as needed for anxiety.     ondansetron (ZOFRAN) 4 MG tablet Take 4 mg by mouth once.     OXYGEN Inhale 2 L/mL into the lungs See admin instructions.     Pediatric Multiple Vitamins (FLINTSTONES MULTIVITAMIN PO) Take 2 tablets by mouth daily.     potassium chloride SA (KLOR-CON M20) 20 MEQ tablet Take 1 tablet (20 mEq total) by mouth every other day. (Patient taking differently: Take 20 mEq by mouth every other day. Taking Tuesday, Thursday, Saturday) 45 tablet 2   prazosin (MINIPRESS) 1 MG capsule Take 1 mg by mouth at bedtime.     pregabalin (LYRICA) 50 MG capsule Take 50 mg by mouth 2 (two) times daily. Taking 50 mg in am, 100 mg at night     propranolol (INDERAL) 10 MG tablet Take 1 tablet (10 mg total) by mouth at bedtime. 90 tablet 3   Ubrogepant (UBRELVY) 100 MG TABS Take 100 mg by mouth as needed (migraines).     vitamin B-12 (CYANOCOBALAMIN) 1000 MCG tablet Take 1,000 mcg by mouth daily.     vortioxetine HBr (TRINTELLIX) 20 MG TABS tablet Take 1 tablet (20 mg total) by mouth daily.     diclofenac (VOLTAREN) 75 MG EC tablet Take 75 mg by mouth 2 (two) times daily. (Patient not taking: Reported on 06/03/2023)     promethazine (PHENERGAN) 12.5 MG tablet Take 12.5 mg by mouth every 6 (six) hours as needed for nausea or vomiting. (Patient not taking: Reported on 06/03/2023)     No facility-administered medications prior to visit.    Review of Systems  Constitutional:  Negative for chills, fever, malaise/fatigue and weight loss.  HENT:  Negative for congestion, sinus pain and sore  throat.   Eyes: Negative.   Respiratory:  Positive for shortness of breath. Negative for cough, hemoptysis, sputum production and wheezing.   Cardiovascular:  Positive for chest pain (tightness). Negative for palpitations, orthopnea, claudication and leg swelling.  Gastrointestinal:  Negative for abdominal pain, heartburn, nausea and vomiting.  Genitourinary: Negative.   Musculoskeletal:  Negative for joint pain and myalgias.  Skin:  Negative for rash.  Neurological:  Negative for weakness.  Endo/Heme/Allergies: Negative.   Psychiatric/Behavioral: Negative.      Objective:   Vitals:   06/03/23 1028  BP: 117/84  Pulse: 74  SpO2: 97%  Weight: 299 lb (135.6 kg)  Height: 5\' 9"  (1.753 m)    Physical Exam Constitutional:      General: She is not in acute distress.    Appearance: She is obese. She is not ill-appearing.  HENT:     Head: Normocephalic and atraumatic.  Eyes:     General: No scleral icterus. Cardiovascular:     Rate and Rhythm: Normal rate and regular rhythm.     Pulses: Normal pulses.     Heart sounds: Normal heart sounds. No murmur heard. Pulmonary:     Effort: Pulmonary effort is normal.     Breath sounds: Decreased breath sounds present. No wheezing, rhonchi or rales.  Musculoskeletal:     Right lower leg: No edema.     Left lower leg: No edema.  Skin:  General: Skin is warm and dry.  Neurological:     General: No focal deficit present.     Mental Status: She is alert.    CBC    Component Value Date/Time   WBC 13.0 (H) 12/26/2021 1300   RBC 4.52 12/26/2021 1300   HGB 13.4 12/26/2021 1300   HCT 41.5 12/26/2021 1300   PLT 307 12/26/2021 1300   MCV 91.8 12/26/2021 1300   MCH 29.6 12/26/2021 1300   MCHC 32.3 12/26/2021 1300   RDW 13.3 12/26/2021 1300   LYMPHSABS 2.8 08/02/2010 1501   MONOABS 0.7 08/02/2010 1501   EOSABS 0.4 08/02/2010 1501   BASOSABS 0.0 08/02/2010 1501      Latest Ref Rng & Units 02/27/2023   11:58 AM 12/26/2021    1:00 PM  07/04/2021   11:28 AM  BMP  Glucose 70 - 99 mg/dL 99  161  096   BUN 6 - 24 mg/dL 8  7  11    Creatinine 0.57 - 1.00 mg/dL 0.45  4.09  8.11   BUN/Creat Ratio 9 - 23 15   19    Sodium 134 - 144 mmol/L 140  137  139   Potassium 3.5 - 5.2 mmol/L 4.4  3.8  3.8   Chloride 96 - 106 mmol/L 98  103  98   CO2 20 - 29 mmol/L 24  28  28    Calcium 8.7 - 10.2 mg/dL 9.2  8.8  9.2    Chest imaging: CTA Chest 03/01/21 No evidence of pulmonary embolism or other acute intrathoracic process.  HRCT Chest 02/05/21 1. No findings to suggest interstitial lung disease. No acute findings in the thorax to account for the patient's symptoms.  CXR 07/02/20 The lungs are clear without focal pneumonia, edema, pneumothorax or pleural effusion. The cardiopericardial silhouette is within normal limits for size. Thoracolumbar scoliosis.   CTA Chest 12/30/19 1. No demonstrable pulmonary embolus. No thoracic aortic aneurysm or dissection. 2. Minimal left base atelectasis. Lungs otherwise clear. 3. No evident thoracic adenopathy.   PFT:    Latest Ref Rng & Units 03/04/2021   10:58 AM  PFT Results  FVC-Pre L 3.08   FVC-Predicted Pre % 71   FVC-Post L 3.22   FVC-Predicted Post % 74   Pre FEV1/FVC % % 83   Post FEV1/FCV % % 84   FEV1-Pre L 2.56   FEV1-Predicted Pre % 73   FEV1-Post L 2.69   DLCO uncorrected ml/min/mmHg 20.62   DLCO UNC% % 80   DLCO corrected ml/min/mmHg 20.19   DLCO COR %Predicted % 78   DLVA Predicted % 103   TLC L 4.54   TLC % Predicted % 78   RV % Predicted % 82     Labs:  Path:  Echo 03/13/2020: LV ef 60-65%. LV diastolic parameters are normal. RV systolic function is normal. RV size is normal.   Heart Catheterization:  Assessment & Plan:   Mild intermittent reactive airway disease without complication - Plan: budesonide (PULMICORT) 0.5 MG/2ML nebulizer solution  Chronic hypoxemic respiratory failure (HCC)  Obesity hypoventilation syndrome (HCC)  Obstructive sleep  apnea on CPAP  Discussion: Briannie Gutierrez is a 43 year old woman, former smoker with obesity, chronic hypoxemic respiratory failure and obstructive sleep apnea who returns to pulmonary clinic for follow up.   Obstructive Sleep Apnea -Continue CPAP therapy nightly with [employer]wear full face mask  Chronic Hypoxemic Respiratory Failure In setting of obesity hypoventilation syndrome - Patient desaturated to 86% on room air  with walking. Titrated to 3L pulsed oxygen via POC with SpO2 remaining 92% or greater.  - will place orders for POC  Obesity Hypoventilation Syndrome Weight Management Patient continues to work on weight loss -Encourage continued weight loss  Reactive airways disease Post-viral symptoms lingering with cough and chest tightness. - add budesonide 0.5mg  neb twice daily - continue albuterol nebds as needed  Follow up in 4 months.   Melody Comas, MD [facility] Office: [phone]    Current Outpatient Medications:    albuterol (VENTOLIN HFA) 108 (90 Base) MCG/ACT inhaler, Inhale 2 puffs into the lungs every 6 (six) hours as needed for wheezing or shortness of breath., Disp: 8 g, Rfl: 6   atorvastatin (LIPITOR) 10 MG tablet, Take 10 mg by mouth daily., Disp: , Rfl:    budesonide (PULMICORT) 0.5 MG/2ML nebulizer solution, Take 2 mLs (0.5 mg total) by nebulization 2 (two) times daily., Disp: 120 mL, Rfl: 6   Cholecalciferol (VITAMIN D3) 1.25 MG (50000 UT) CAPS, Take 50,000 Units by mouth once a week., Disp: , Rfl:    EPINEPHrine 0.3 mg/0.3 mL IJ SOAJ injection, Inject 0.3 mg into the muscle as needed for anaphylaxis., Disp: , Rfl:    FARXIGA 5 MG TABS tablet, Take 5 mg by mouth every morning., Disp: , Rfl:    flecainide (TAMBOCOR) 50 MG tablet, Take 1 tablet (50 mg total) by mouth 2 (two) times daily., Disp: 180 tablet, Rfl: 3   furosemide (LASIX) 40 MG tablet, Take 1 tablet (40 mg total) by mouth every other day. (Patient taking differently: Take 40  mg by mouth every other day. Taking Tuesday, Thursday, Saturday), Disp: 45 tablet, Rfl: 2   linaclotide (LINZESS) 290 MCG CAPS capsule, Take 290 mcg by mouth daily as needed., Disp: , Rfl:    lithium carbonate (LITHOBID) 300 MG ER tablet, Take 300 mg by mouth at bedtime., Disp: , Rfl:    loratadine (CLARITIN) 10 MG tablet, Take 10 mg by mouth daily as needed for allergies., Disp: , Rfl:    LORazepam (ATIVAN) 0.5 MG tablet, Take 0.5 mg by mouth every 8 (eight) hours as needed for anxiety., Disp: , Rfl:    ondansetron (ZOFRAN) 4 MG tablet, Take 4 mg by mouth once., Disp: , Rfl:    OXYGEN, Inhale 2 L/mL into the lungs See admin instructions., Disp: , Rfl:    Pediatric Multiple Vitamins (FLINTSTONES MULTIVITAMIN PO), Take 2 tablets by mouth daily., Disp: , Rfl:    potassium chloride SA (KLOR-CON M20) 20 MEQ tablet, Take 1 tablet (20 mEq total) by mouth every other day. (Patient taking differently: Take 20 mEq by mouth every other day. Taking Tuesday, Thursday, Saturday), Disp: 45 tablet, Rfl: 2   prazosin (MINIPRESS) 1 MG capsule, Take 1 mg by mouth at bedtime., Disp: , Rfl:    pregabalin (LYRICA) 50 MG capsule, Take 50 mg by mouth 2 (two) times daily. Taking 50 mg in am, 100 mg at night, Disp: , Rfl:    propranolol (INDERAL) 10 MG tablet, Take 1 tablet (10 mg total) by mouth at bedtime., Disp: 90 tablet, Rfl: 3   Ubrogepant (UBRELVY) 100 MG TABS, Take 100 mg by mouth as needed (migraines)., Disp: , Rfl:    vitamin B-12 (CYANOCOBALAMIN) 1000 MCG tablet, Take 1,000 mcg by mouth daily., Disp: , Rfl:    vortioxetine HBr (TRINTELLIX) 20 MG TABS tablet, Take 1 tablet (20 mg total) by mouth daily., Disp: , Rfl:    diclofenac (VOLTAREN) 75  MG EC tablet, Take 75 mg by mouth 2 (two) times daily. (Patient not taking: Reported on 06/03/2023), Disp: , Rfl:    promethazine (PHENERGAN) 12.5 MG tablet, Take 12.5 mg by mouth every 6 (six) hours as needed for nausea or vomiting. (Patient not taking: Reported on  06/03/2023), Disp: , Rfl:

## 2023-06-04 NOTE — Telephone Encounter (Signed)
 Pharmacy Patient Advocate Encounter  Received notification from [facility] that Prior Authorization for Budesonide neb sol has been DENIED.  Full denial letter will be uploaded to the media tab. See denial reason below.

## 2023-06-09 ENCOUNTER — Other Ambulatory Visit: Payer: Self-pay

## 2023-06-09 ENCOUNTER — Telehealth: Payer: Self-pay | Admitting: Pulmonary Disease

## 2023-06-09 DIAGNOSIS — J4520 Mild intermittent asthma, uncomplicated: Secondary | ICD-10-CM

## 2023-06-09 MED ORDER — BUDESONIDE 0.5 MG/2ML IN SUSP: 0.5000 mg | Freq: Two times a day (BID) | RESPIRATORY_TRACT | 6 refills | Status: AC

## 2023-06-09 NOTE — Telephone Encounter (Signed)
 Patient states [facility] does not have the order for oxygen. Patient phone number is [phone].

## 2023-06-23 ENCOUNTER — Ambulatory Visit: Admitting: Podiatry

## 2023-06-23 ENCOUNTER — Ambulatory Visit: Payer: Medicare Other | Admitting: Podiatry

## 2023-06-30 ENCOUNTER — Encounter: Payer: Self-pay | Admitting: Podiatry

## 2023-06-30 ENCOUNTER — Ambulatory Visit (INDEPENDENT_AMBULATORY_CARE_PROVIDER_SITE_OTHER): Admitting: Podiatry

## 2023-06-30 DIAGNOSIS — L853 Xerosis cutis: Secondary | ICD-10-CM | POA: Diagnosis not present

## 2023-06-30 DIAGNOSIS — E1142 Type 2 diabetes mellitus with diabetic polyneuropathy: Secondary | ICD-10-CM | POA: Diagnosis not present

## 2023-06-30 DIAGNOSIS — M7741 Metatarsalgia, right foot: Secondary | ICD-10-CM

## 2023-06-30 DIAGNOSIS — B351 Tinea unguium: Secondary | ICD-10-CM

## 2023-06-30 MED ORDER — AMMONIUM LACTATE 12 % EX CREA: 1.0000 | TOPICAL_CREAM | CUTANEOUS | 0 refills | Status: DC | PRN

## 2023-06-30 NOTE — Progress Notes (Signed)
  Subjective:  Patient ID: Ellen Shaw, female    DOB: 29-Jan-1981,  MRN: [id number]  Chief Complaint  Patient presents with   [facility]    [facility] with callous care. She is also wanting you to take over care for the neuroma that Dr. Stover has been treating. She states they have tried an inj, and the walking boot but she has not had any relief. Last A1c was 6.5 in March. No anti coag.     43 y.o. female presents with the above complaint. History confirmed with patient. Patient presenting with pain related to dystrophic thickened elongated nails. Patient is unable to trim own nails related to nail dystrophy and/or mobility issues. Patient does have a history of T2DM.  Does complain of neuropathy pain. Last A1c 6.5.  Patient has also been seeing Dr. Stover for right forefoot and midfoot pain.  She would like a second opinion on this.  Primary area of complaint seems to be the metatarsal heads and 2nd MPJ of the right foot.  Objective:  Physical Exam: warm to cool, good capillary refill, pedal skin atrophic nail exam onycholysis and dystrophic nails DP pulses palpable, PT pulses palpable, and protective sensation absent Left Foot:  Pain with palpation of nails due to elongation and dystrophic growth.  Incurvation and tenderness of left first toe bilateral borders without infection Right Foot: Pain with palpation of nails due to elongation and dystrophic growth.  Incurvation and tenderness of right first toe lateral border without infection.  No palpation of right second metatarsal head plantarly, tenderness with Lachman drawer maneuver with some instability noted. Dry cracked heels noted bilaterally.  Assessment:   1. DM type 2 with diabetic peripheral neuropathy (HCC)   2. Pain due to onychomycosis of toenails of both feet   3. Xerosis cutis   4. Metatarsalgia of right foot      Plan:  Patient was evaluated and treated and all questions answered.   #Onychomycosis with pain  -Nails  palliatively debrided as below. -Educated on self-care  Procedure: Nail Debridement Rationale: Pain Type of Debridement: manual, sharp debridement. Instrumentation: Nail nipper, rotary burr. Number of Nails: 10  #Diabetes with neuropathy # Xerosis cutis with dry cracked heels -Patient educated on diabetes. Discussed proper diabetic foot care and discussed risks and complications of disease. Educated patient in depth on reasons to return to the office immediately should he/she discover anything concerning or new on the feet. All questions answered. Discussed proper shoes as well.  - Prescription sent in for AmLactin to be applied to the heels as needed, recommend using at least daily for the time being due to extent of dry cracked heels which could lead to ulceration and infection  # Right foot metatarsalgia - Some concern for predislocation syndrome.  We will work this up further at next visit.  Metatarsal pad applied today. - Recommend use of topical Voltaren gel in the meantime.    Return in about 2 weeks (around 07/14/2023) for Metatarsalgia right foot, xr at follow up.         Eve Hinders, DPM [facility] / [facility]

## 2023-07-02 ENCOUNTER — Telehealth: Payer: Self-pay | Admitting: *Deleted

## 2023-07-02 NOTE — Telephone Encounter (Signed)
 Copied from CRM [id number]. Topic: General - Other >> Jun 29, 2023  4:09 PM Hilton Lucky wrote: Reason for CRM: Patient is calling to request a follow-up or written prescription for a portable oxygen concentrator. She states clinic said they sent it to [facility] but that the DME company states they have not gotten it. Patient would like a written prescription to pick up and take to the company if at all possible. If not, patient would like advisement or update on prescription status.  Patient requesting call for next steps. >> Jun 30, 2023  2:40 PM Raven B wrote: Re faxing to DME and will give them a call   Please advise outcome of the call to DME, thanks

## 2023-07-03 NOTE — Telephone Encounter (Signed)
 Spoke to the rep at [facility] and she states they will look up the pt in epic themselves.   She said a Rep named Abraham Hoffmann will look at the pts order

## 2023-07-06 NOTE — Telephone Encounter (Signed)
 Is there any word back on the outcome of this from [employer]?

## 2023-07-07 NOTE — Telephone Encounter (Signed)
 Copied from CRM [id number]. Topic: General - Other >> Jul 06, 2023  2:28 PM Justina Oman C wrote: Reason for CRM: Patient [phone] would like to get a paper prescription portable oxygen concentrator at the office so she can give it to [facility]. Patient states has been contacting the office and [facility] about this matter and there's been issues, so patient will take it to the [facility] herself. Please call back.  ATC x1 LVM for patient to call our office back.

## 2023-07-08 NOTE — Telephone Encounter (Signed)
 Patient is returning call for a Ellen Shaw from yesterday . Called the cal to see if Ellen Shaw is available . Patient says she will be available after [phone]

## 2023-07-08 NOTE — Telephone Encounter (Signed)
Routing to Bonnie

## 2023-07-08 NOTE — Telephone Encounter (Signed)
 Spoke with patient regarding prior message. Advised patient I will contact [facility] to see if they have received the order for POC.  Spoke with [facility] and they have received our order on 4/15/2025and is working on the order.   Spoke with patient to let her know [facility] is working on the POC order and should contact patient . Patient's voice was understanding.Nothing else further needed.

## 2023-07-13 ENCOUNTER — Encounter: Payer: Self-pay | Admitting: Podiatry

## 2023-07-14 ENCOUNTER — Ambulatory Visit: Admitting: Podiatry

## 2023-07-27 ENCOUNTER — Encounter: Payer: Self-pay | Admitting: Podiatry

## 2023-07-27 ENCOUNTER — Ambulatory Visit (INDEPENDENT_AMBULATORY_CARE_PROVIDER_SITE_OTHER): Admitting: Podiatry

## 2023-07-27 ENCOUNTER — Ambulatory Visit (INDEPENDENT_AMBULATORY_CARE_PROVIDER_SITE_OTHER)

## 2023-07-27 ENCOUNTER — Telehealth: Payer: Self-pay

## 2023-07-27 DIAGNOSIS — M7741 Metatarsalgia, right foot: Secondary | ICD-10-CM | POA: Diagnosis not present

## 2023-07-27 DIAGNOSIS — E1142 Type 2 diabetes mellitus with diabetic polyneuropathy: Secondary | ICD-10-CM | POA: Diagnosis not present

## 2023-07-27 NOTE — Telephone Encounter (Signed)
 Pt LM asking about the status of her DM shoes

## 2023-07-27 NOTE — Patient Instructions (Signed)
 More silicone pads can be purchased from:  [email or web address]  Look for metatarsal pads

## 2023-07-27 NOTE — Progress Notes (Unsigned)
 Chief Complaint  Patient presents with   Metatarsalgia    Right foot metatarsalgia. Xrays up, she is still having some pain. The pads have helped some. She can not wear bedroom shoes or go barefoot at this time. She needs another met pad if you would like her to continue using it. She does have a copy of the MRI report on her phone but forgot the disk. Last A1c was 6.5 in March. No anti coag.     HPI: 43 y.o. female presents today presenting today for right foot metatarsalgia.  She has been using metatarsal pads which have been helpful somewhat.  Previously had MRI, does not have the disc with her currently.  She reports previously having a steroid injection which caused a steroid flare.  Past Medical History:  Diagnosis Date   Alcohol abuse 02/21/2014   Allergic rhinitis 01/31/2008   Anxiety 07/22/2016   Arthritis 07/22/2016   Knees   Asthma 01/31/2008   Bell's palsy    age 26-14   Bilateral leg edema 02/15/2020   Chest discomfort 03/19/2020   Complex atypical endometrial hyperplasia 08/01/2013   Folliculitis 10/12/2017   GERD (gastroesophageal reflux disease) 12/03/2012   History of Helicobacter pylori infection 03/03/2017   Hyperlipidemia    Hypertension    Intertrigo 09/01/2017   Lumbosacral radiculopathy 09/01/2013   Major depressive disorder, recurrent episode (HCC) 05/02/2014   Medication overuse headache 07/27/2014   Migraine with aura and without status migrainosus, not intractable 07/27/2014   Follows with Dr. Agustina Aldrich in Neurology. HX pseudotumor cerebri   Mixed stress and urge urinary incontinence 08/12/2013   Morbid obesity (HCC)    NSVT (nonsustained ventricular tachycardia) (HCC) 03/19/2020   Obstructive sleep apnea on CPAP 12/27/2010   AHI=21.8 AutoPAP at 15-20 with an EPR=3   Other specific personality disorders (HCC)    Patellofemoral pain syndrome 10/12/2012   Posttraumatic stress disorder 02/04/2012   Prediabetes 06/30/2017   A1C 6.1 on 06/2017    Pseudotumor    PVC (premature ventricular contraction) 03/19/2020   Right bundle branch block 02/15/2020   Right cervical radiculopathy 07/18/2014   SVT (supraventricular tachycardia) (HCC) 07/15/2016   Tobacco use 12/03/2011   Umbilical cyst 06/02/2011   Urinary retention 08/19/2013   S/P interstim placement with Urology   Vulvovaginal candidiasis 03/03/2017    Past Surgical History:  Procedure Laterality Date   BLADDER SURGERY     CHOLECYSTECTOMY N/A 01/09/2022   Procedure: LAPAROSCOPIC CHOLECYSTECTOMY;  Surgeon: Derral Flick, MD;  Location: [facility];  Service: General;  Laterality: N/A;   EYELID LACERATION REPAIR Bilateral    NASAL SEPTUM SURGERY     TONSILLECTOMY AND ADENOIDECTOMY     VAGINAL HYSTERECTOMY  2015    Allergies  Allergen Reactions   Bee Venom Shortness Of Breath and Swelling   Ciprofloxacin Anaphylaxis, Shortness Of Breath and Rash   Clindamycin Shortness Of Breath   Esomeprazole Magnesium Hives and Shortness Of Breath   Nitrofurantoin Shortness Of Breath   Oxycodone Other (See Comments)    Other: "I passed out." Other: convulsions "shaking" Other reaction(s): Dizziness, Hypotension, Wheezing (ALLERGY/intolerance)     Sulfa Antibiotics Shortness Of Breath and Rash   Trimethoprim     Other reaction(s): Rash, Rash, Shortness of breath   Citrus Hives   Doxycycline Other (See Comments)    Muscle spasms  Bad muscle spasms    Fluticasone Rash and Other (See Comments)    "Headache and nosebleed"     Penicillins  Rash   Buspirone    Fentanyl  Other (See Comments)    "I pass out."  *tolerated 06/11/2021 and 01/09/22 intraoperatively without complications*     Lexapro [Escitalopram]    Paliperidone Other (See Comments)    Other reaction(s): Dystonia   Pantoprazole Hives    Relieved with Benadryl    ROS    Physical Exam: There were no vitals filed for this visit.  General: The patient is alert and oriented x3 in no acute  distress.  Dermatology: Pedal skin atrophic.  Interspaces are clear of maceration and debris.  Onychomycosis of the toenails.  Vascular: Palpable pedal pulses bilaterally. Capillary refill within normal limits.  No appreciable edema.  No erythema or calor.  Neurological: Protective sensation decreased.  Musculoskeletal Exam: Right foot some tenderness on palpation of metatarsal heads plantarly.  No clinical signs of neuroma noted.  No tenderness with Lachman drawer maneuver without significant instability of second MPJ right foot  Radiographic Exam: Right foot 3 views weightbearing 07/27/2023 Normal osseous mineralization. Joint spaces preserved.  No fractures or acute osseous irregularities noted.  Met adductus noted with high arched foot type.  Assessment/Plan of Care: 1. Metatarsalgia of right foot   2. DM type 2 with diabetic peripheral neuropathy (HCC)      No orders of the defined types were placed in this encounter.  None  Discussed clinical findings with patient today.  Radiographs reviewed with patient  We did discuss corticosteroid injection, she reports that she did have symptoms consistent with steroid flare previously and would like to defer this.  She is currently taking p.o. diclofenac.  Recommend use of topical Voltaren gel 1% massaged in the area 3-4 times a day and continued offloading with metatarsal pad.  Universal gel strap dispensed the patient today.  Follow-up as needed for this issue.  Return to clinic in about 2 months for diabetic footcare.   Abrahm Mancia L. Lunda Salines, AACFAS [facility]     [address] [place], [place] [address]                Office [phone]  Fax [phone]

## 2023-07-31 NOTE — Telephone Encounter (Signed)
 Mercedes from [employer] called and said Ms. [employer]orks shoes will be on back order until Sept. 5th. She asks that you call her back at [phone]. Thanks.

## 2023-08-04 ENCOUNTER — Encounter: Payer: Self-pay | Admitting: Podiatry

## 2023-09-18 ENCOUNTER — Encounter: Payer: Self-pay | Admitting: Cardiology

## 2023-09-21 MED ORDER — PROPRANOLOL HCL 10 MG PO TABS
10.0000 mg | ORAL_TABLET | Freq: Every evening | ORAL | 3 refills | Status: AC
Start: 2023-09-21 — End: ?

## 2023-09-21 MED ORDER — FLECAINIDE ACETATE 50 MG PO TABS: 50.0000 mg | ORAL_TABLET | Freq: Two times a day (BID) | ORAL | 3 refills | Status: DC

## 2023-09-29 ENCOUNTER — Encounter: Payer: Self-pay | Admitting: Podiatry

## 2023-09-29 ENCOUNTER — Ambulatory Visit (INDEPENDENT_AMBULATORY_CARE_PROVIDER_SITE_OTHER): Admitting: Podiatry

## 2023-09-29 DIAGNOSIS — L84 Corns and callosities: Secondary | ICD-10-CM | POA: Diagnosis not present

## 2023-09-29 DIAGNOSIS — M79675 Pain in left toe(s): Secondary | ICD-10-CM | POA: Diagnosis not present

## 2023-09-29 DIAGNOSIS — B351 Tinea unguium: Secondary | ICD-10-CM

## 2023-09-29 DIAGNOSIS — E1142 Type 2 diabetes mellitus with diabetic polyneuropathy: Secondary | ICD-10-CM | POA: Diagnosis not present

## 2023-09-29 DIAGNOSIS — M79674 Pain in right toe(s): Secondary | ICD-10-CM

## 2023-09-29 NOTE — Patient Instructions (Signed)
 Look for urea 40% cream or ointment and apply to the thickened dry skin / calluses. This can be bought over the counter, at a pharmacy or online such as [employer].    More silicone pads can be purchased from:  [email or web address]

## 2023-09-29 NOTE — Progress Notes (Unsigned)
  Subjective:  Patient ID: Ellen Shaw, female    DOB: 1980-08-22,  MRN: [id number]  Chief Complaint  Patient presents with   Diabetes    3 month follow up, at risk foot care   Peripheral Neuropathy    43 y.o. female presents with the above complaint. History confirmed with patient. Patient presenting with pain related to dystrophic thickened elongated nails. Patient is unable to trim own nails related to nail dystrophy and/or mobility issues. Patient does have a history of T2DM.  She does still complain of ongoing forefoot pain however it is improved with the use of metatarsal pads and the padded gel sleeves.  She is complaining of painful callus today to the right fifth metatarsal head and right first toe.  Objective:  Physical Exam: warm to cool, good capillary refill, pedal skin atrophic, decreased pedal hair growth nail exam onycholysis and dystrophic nails DP pulses palpable, PT pulses palpable, and protective sensation absent Left Foot:  Pain with palpation of nails due to elongation and dystrophic growth Right Foot: Pain with palpation of nails due to elongation and dystrophic growth.  Preulcerative callus present right subfifth metatarsal head and plantar medial first toe at level of proximal phalanx. Dry cracked heels noted bilaterally.  Assessment:   1. DM type 2 with diabetic peripheral neuropathy (HCC)   2. Pain due to onychomycosis of toenails of both feet   3. Pre-ulcerative calluses      Plan:  Patient was evaluated and treated and all questions answered.  # Preulcerative callus present right subfifth metatarsal head and plantar medial first toe at level of proximal phalanx All symptomatic hyperkeratoses x2 were safely debrided with a sterile #312 blade to patient's level of comfort without incident. We discussed preventative and palliative care of these lesions including supportive and accommodative shoegear, padding, prefabricated and custom molded accommodative  orthoses, use of a pumice stone and lotions/creams daily.   #Onychomycosis with pain  -Nails palliatively debrided as below. -Educated on self-care  Procedure: Nail Debridement Rationale: Pain Type of Debridement: manual, sharp debridement. Instrumentation: Nail nipper, rotary burr. Number of Nails: 10  #Diabetes with neuropathy # Xerosis cutis with dry cracked heels -Patient educated on diabetes. Discussed proper diabetic foot care and discussed risks and complications of disease. Educated patient in depth on reasons to return to the office immediately should he/she discover anything concerning or new on the feet. All questions answered. Discussed proper shoes as well.  - Continue the use of AmLactin or other topical urea cream.  Left and right foot metatarsalgia - Universal gel strap with metatarsal pad built in dispensed to patient today x 2.    Return in about 3 months (around 12/30/2023) for Diabetic Foot Care.         Ethan Saddler, DPM [facility] / [facility]

## 2023-10-13 ENCOUNTER — Telehealth: Payer: Self-pay

## 2023-10-13 NOTE — Telephone Encounter (Signed)
 [employer] request for tag in airview

## 2023-10-14 ENCOUNTER — Encounter: Payer: Self-pay | Admitting: Pulmonary Disease

## 2023-10-14 ENCOUNTER — Telehealth: Payer: Self-pay

## 2023-10-14 ENCOUNTER — Ambulatory Visit (INDEPENDENT_AMBULATORY_CARE_PROVIDER_SITE_OTHER): Admitting: Pulmonary Disease

## 2023-10-14 VITALS — BP 116/80 | HR 72 | Ht 69.0 in | Wt 312.0 lb

## 2023-10-14 DIAGNOSIS — J9611 Chronic respiratory failure with hypoxia: Secondary | ICD-10-CM | POA: Diagnosis not present

## 2023-10-14 DIAGNOSIS — G4733 Obstructive sleep apnea (adult) (pediatric): Secondary | ICD-10-CM

## 2023-10-14 DIAGNOSIS — Z87891 Personal history of nicotine dependence: Secondary | ICD-10-CM | POA: Diagnosis not present

## 2023-10-14 DIAGNOSIS — J4520 Mild intermittent asthma, uncomplicated: Secondary | ICD-10-CM | POA: Diagnosis not present

## 2023-10-14 MED ORDER — ALBUTEROL SULFATE HFA 108 (90 BASE) MCG/ACT IN AERS
2.0000 | INHALATION_SPRAY | Freq: Four times a day (QID) | RESPIRATORY_TRACT | 6 refills | Status: AC | PRN
Start: 2023-10-14 — End: ?

## 2023-10-14 NOTE — Patient Instructions (Addendum)
 We will check overnight oxygen test while on CPAP to determine if you need supplemental oxygen  Continue supplemental oxygen  Continue budesonide  and albuterol  nebs as needed, consider using the budesonide  scheduled with this heat   We will reach out to [facility] regarding the portable oxygen concentrator  Follow up in 6 months, call sooner if needed

## 2023-10-14 NOTE — Progress Notes (Signed)
 Synopsis: Referred in November 2022 for chronic hypoxemic respiratory failure by Mallie Herald, PA  Subjective:   PATIENT ID: Ellen Shaw GENDER: female DOB: Feb 25, 1981, MRN: [id number]  HPI  Chief Complaint  Patient presents with   Medical Management of Chronic Issues   Ellen Shaw is a 43 year old woman, former smoker with obesity, chronic hypoxemic respiratory failure and obstructive sleep apnea who returns to pulmonary clinic for follow up.   She experiences difficulty breathing, worsened by heat, and remains indoors on hot days. She uses a CPAP machine at night with a Dreamwear full mask, which she finds comfortable. Her nocturnal oxygen levels drop to 86%, causing concern, and she does not use supplemental oxygen with her CPAP.  She uses budesonide  as needed, initially using it regularly for a month. She experiences occasional wheezing and coughing, not severe, and uses her albuterol  inhaler more frequently due to heat-related breathing difficulties.  She faces issues obtaining a portable oxygen concentrator.   OV 06/03/23 She has been experiencing issues with her CPAP mask and supplies. A shipment was sent on January 8th but was not found at her post office box. After three weeks of communication with the post office and the supply company, she received a replacement last week. She used the new mask for one night and found it effective. However, she has been unable to stay at her apartment due to smoke from marijuana in the hallways, which has forced her to stay with her mother and sister. Currently, she is sleeping in a chair at their place as they have recently moved and do not have a space for her.  She is actively seeking to move to a different apartment due to the smoke issue. Her current apartment is on the second floor, and she has difficulty with the stairs, needing to stop and rest due to shortness of breath, especially when ascending. She uses two liters of oxygen when  walking and finds it challenging to manage with her current setup, especially when walking her dog. She inquires about portable oxygen concentrators as she is going through her current oxygen supply quickly. She uses [facility] for her oxygen needs. She reports slight swelling in her legs and uses oxygen at two liters at rest.  She had the flu last month and continues to experience chest tightness, which is slowly improving. She uses albuterol  as needed but finds it insufficient to manage her symptoms. She has tried other inhalers in the past, including a purple powder inhaler, which she found uncomfortable. She reports occasional mucus production that feels stuck in her throat.   OV 03/05/23 She presents with issues related to her CPAP machine. She reports discomfort with the current mask, which covers both the nose and mouth, and triggers traumatic memories. The patient expresses a preference for a mask that only covers the nose, similar to one she had used previously. She also reports occasional cough and shortness of breath, which she attributes to not using her oxygen as much due to helping her family move. The patient has been actively trying to lose weight and has been successful in doing so. She also reports a recent foot fracture, for which she is currently wearing a boot.  DME company [employer]   OV 02/25/2022 She has been losing weight. She is going to the gym and walking on the treadmill. She had a gallbladder surgery in October and was not eating a lot during that period of time. She does  report wheezing on days with cold air like today.   She is using 1-2L of oxygen.   OV 06/04/21 She has done well since last visit. She is working with a nutritionist on weight loss but has not lost any weight since the beginning of the year.   She has an upcoming surgery scheduled with ENT for deviated septum and nasal obstruction.   She is intolerant of CPAP therapy due to ear  fullness for her OSA.   OV 03/04/2021 HRCT Chest from 02/05/21 did not show any parenchymal or airway issues. PFTs today show mild restrictive defect with normal diffusion capacity. There was slight improvement in FEV1, FVC, TLC and DLCO overall compared to prior PFTs from [place].   She continues to have exertional dyspnea. Denies cough, wheezing or chest tightness. She reports episodes of her heart rate and a decline in SpO2. She does continue to have palpations despite being on flecainide  and propranolol  per cardiology.   She is being evaluated by ENT in [place], [place] who is planning to operate on bilateral bone spurs of her nose.   She has not returned to smoking. She started going to the weight management clinic last month.  OV 01/16/21 She was started on oxygen earlier this year by her primary care team due to SpO2 readings in the mid 80s on room air. She is currently on 1-2L of supplemental oxygen. She is using CPAP therapy at night and is followed at [facility] for her sleep apnea.   The oxygen need is new since being sick with covid 19 in 11/2019 and again 07/2020.   She had pulmonary function tests performed 07/05/20 which showed FEV1 2.58L (74%), FVC 3.12L (72%), ratio 83, TLC 4.14L (71%), DLCO 30.99 (74%).  She was evaluated by Dr. Arlina of pulmonary at [facility] on 07/12/20. She was referred to pulmonary rehab at [facility] and is waiting to be enrolled in this program. They trialed her on anoro ellipta but she did not tolerate this medicine due to chest discomfort. They recommended weight loss.  She is seeing a nutritionist at this time to work on weight loss.   She quit smoking 04/2020 and smoked 1-2 packs over the past 10 years.   Past Medical History:  Diagnosis Date   Alcohol abuse 02/21/2014   Allergic rhinitis 01/31/2008   Anxiety 07/22/2016   Arthritis 07/22/2016   Knees   Asthma 01/31/2008   Bell's palsy    age 60-14   Bilateral leg edema  02/15/2020   Chest discomfort 03/19/2020   Complex atypical endometrial hyperplasia 08/01/2013   Folliculitis 10/12/2017   GERD (gastroesophageal reflux disease) 12/03/2012   History of Helicobacter pylori infection 03/03/2017   Hyperlipidemia    Hypertension    Intertrigo 09/01/2017   Lumbosacral radiculopathy 09/01/2013   Major depressive disorder, recurrent episode (HCC) 05/02/2014   Medication overuse headache 07/27/2014   Migraine with aura and without status migrainosus, not intractable 07/27/2014   Follows with Dr. Delsie in Neurology. HX pseudotumor cerebri   Mixed stress and urge urinary incontinence 08/12/2013   Morbid obesity (HCC)    NSVT (nonsustained ventricular tachycardia) (HCC) 03/19/2020   Obstructive sleep apnea on CPAP 12/27/2010   AHI=21.8 AutoPAP at 15-20 with an EPR=3   Other specific personality disorders (HCC)    Patellofemoral pain syndrome 10/12/2012   Posttraumatic stress disorder 02/04/2012   Prediabetes 06/30/2017   A1C 6.1 on 06/2017   Pseudotumor    PVC (premature ventricular contraction) 03/19/2020  Right bundle branch block 02/15/2020   Right cervical radiculopathy 07/18/2014   SVT (supraventricular tachycardia) (HCC) 07/15/2016   Tobacco use 12/03/2011   Umbilical cyst 06/02/2011   Urinary retention 08/19/2013   S/P interstim placement with Urology   Vulvovaginal candidiasis 03/03/2017     Family History  Problem Relation Age of Onset   COPD Father    Lung cancer Father    Congestive Heart Failure Father    Hypertension Father    Kidney disease Father    Atrial fibrillation Father    Macular degeneration Father    Glaucoma Father    Stroke Father    High Cholesterol Mother    Heart attack Maternal Grandmother    Lung cancer Paternal Grandmother      Social History   Socioeconomic History   Marital status: Single    Spouse name: Not on file   Number of children: Not on file   Years of education: Not on file   Highest education  level: Not on file  Occupational History   Not on file  Tobacco Use   Smoking status: Former    Current packs/day: 0.00    Types: Cigarettes    Quit date: 01/19/2020    Years since quitting: 3.7   Smokeless tobacco: Never  Vaping Use   Vaping status: Never Used  Substance and Sexual Activity   Alcohol use: Not Currently   Drug use: No   Sexual activity: Not on file  Other Topics Concern   Not on file  Social History Narrative   Not on file   Social Drivers of Health   Financial Resource Strain: Not on file  Food Insecurity: Low Risk  (07/06/2023)   Received from [facility]   Hunger Vital Sign    Within the past 12 months, you worried that your food would run out before you got money to buy more: Never true    Within the past 12 months, the food you bought just didn't last and you didn't have money to get more. : Never true  Transportation Needs: No Transportation Needs (07/06/2023)   Received from [facility]    In the past 12 months, has lack of reliable transportation kept you from medical appointments, meetings, work or from getting things needed for daily living? : No  Physical Activity: Not on file  Stress: Not on file  Social Connections: Not on file  Intimate Partner Violence: Not on file     Allergies  Allergen Reactions   Bee Venom Shortness Of Breath and Swelling   Ciprofloxacin Anaphylaxis, Shortness Of Breath and Rash   Clindamycin Shortness Of Breath   Esomeprazole Magnesium Hives and Shortness Of Breath   Nitrofurantoin Shortness Of Breath   Oxycodone Other (See Comments)    Other: I passed out. Other: convulsions shaking Other reaction(s): Dizziness, Hypotension, Wheezing (ALLERGY/intolerance)     Sulfa Antibiotics Shortness Of Breath and Rash   Trimethoprim     Other reaction(s): Rash, Rash, Shortness of breath   Citrus Hives   Doxycycline Other (See Comments)    Muscle spasms  Bad muscle spasms    Fluticasone Rash  and Other (See Comments)    Headache and nosebleed     Penicillins Rash   Buspirone    Fentanyl  Other (See Comments)    I pass out.  *tolerated 06/11/2021 and 01/09/22 intraoperatively without complications*     Lexapro [Escitalopram]    Paliperidone Other (See Comments)    Other  reaction(s): Dystonia   Pantoprazole Hives    Relieved with Benadryl     Outpatient Medications Prior to Visit  Medication Sig Dispense Refill   albuterol  (VENTOLIN  HFA) 108 (90 Base) MCG/ACT inhaler Inhale 2 puffs into the lungs every 6 (six) hours as needed for wheezing or shortness of breath. 8 g 6   ammonium lactate  (AMLACTIN) 12 % cream Apply 1 Application topically as needed for dry skin. Apply to dry cracked skin 385 g 0   atorvastatin (LIPITOR) 10 MG tablet Take 10 mg by mouth daily.     budesonide  (PULMICORT ) 0.5 MG/2ML nebulizer solution Take 2 mLs (0.5 mg total) by nebulization 2 (two) times daily. 120 mL 6   Cholecalciferol (VITAMIN D3) 1.25 MG (50000 UT) CAPS Take 50,000 Units by mouth once a week.     diclofenac (VOLTAREN) 75 MG EC tablet Take 75 mg by mouth 2 (two) times daily.     EPINEPHrine  0.3 mg/0.3 mL IJ SOAJ injection Inject 0.3 mg into the muscle as needed for anaphylaxis.     FARXIGA 5 MG TABS tablet Take 5 mg by mouth every morning.     flecainide  (TAMBOCOR ) 50 MG tablet Take 1 tablet (50 mg total) by mouth 2 (two) times daily. 180 tablet 3   furosemide  (LASIX ) 40 MG tablet Take 1 tablet (40 mg total) by mouth every other day. (Patient taking differently: Take 40 mg by mouth every other day. Taking Tuesday, Thursday, Saturday) 45 tablet 2   linaclotide (LINZESS) 290 MCG CAPS capsule Take 290 mcg by mouth daily as needed.     lithium carbonate (LITHOBID) 300 MG ER tablet Take 300 mg by mouth at bedtime.     loratadine (CLARITIN) 10 MG tablet Take 10 mg by mouth daily as needed for allergies.     LORazepam (ATIVAN) 0.5 MG tablet Take 0.5 mg by mouth every 8 (eight) hours as needed  for anxiety.     ondansetron  (ZOFRAN ) 4 MG tablet Take 4 mg by mouth once.     OXYGEN Inhale 2 L/mL into the lungs See admin instructions.     Pediatric Multiple Vitamins (FLINTSTONES MULTIVITAMIN PO) Take 2 tablets by mouth daily.     potassium chloride  SA (KLOR-CON  M20) 20 MEQ tablet Take 1 tablet (20 mEq total) by mouth every other day. (Patient taking differently: Take 20 mEq by mouth every other day. Taking Tuesday, Thursday, Saturday) 45 tablet 2   prazosin (MINIPRESS) 1 MG capsule Take 1 mg by mouth at bedtime.     pregabalin (LYRICA) 50 MG capsule Take 50 mg by mouth 2 (two) times daily. Taking 50 mg in am, 100 mg at night     promethazine (PHENERGAN) 12.5 MG tablet Take 12.5 mg by mouth every 6 (six) hours as needed for nausea or vomiting.     propranolol  (INDERAL ) 10 MG tablet Take 1 tablet (10 mg total) by mouth at bedtime. 90 tablet 3   Ubrogepant (UBRELVY) 100 MG TABS Take 100 mg by mouth as needed (migraines).     vitamin B-12 (CYANOCOBALAMIN) 1000 MCG tablet Take 1,000 mcg by mouth daily.     vortioxetine HBr (TRINTELLIX) 20 MG TABS tablet Take 1 tablet (20 mg total) by mouth daily.     No facility-administered medications prior to visit.    Review of Systems  Constitutional:  Negative for chills, fever, malaise/fatigue and weight loss.  HENT:  Negative for congestion, sinus pain and sore throat.   Eyes: Negative.   Respiratory:  Positive for shortness of breath. Negative for cough, hemoptysis, sputum production and wheezing.   Cardiovascular:  Negative for chest pain, palpitations, orthopnea, claudication and leg swelling.  Gastrointestinal:  Negative for abdominal pain, heartburn, nausea and vomiting.  Genitourinary: Negative.   Musculoskeletal:  Negative for joint pain and myalgias.  Skin:  Negative for rash.  Neurological:  Negative for weakness.  Endo/Heme/Allergies: Negative.   Psychiatric/Behavioral: Negative.      Objective:   Vitals:   10/14/23 1147  BP:  116/80  Pulse: 72  SpO2: 100%  Weight: (!) 312 lb (141.5 kg)  Height: 5' 9 (1.753 m)     Physical Exam Constitutional:      General: She is not in acute distress.    Appearance: She is obese. She is not ill-appearing.  HENT:     Head: Normocephalic and atraumatic.  Eyes:     General: No scleral icterus. Cardiovascular:     Rate and Rhythm: Normal rate and regular rhythm.     Pulses: Normal pulses.     Heart sounds: Normal heart sounds. No murmur heard. Pulmonary:     Effort: Pulmonary effort is normal.     Breath sounds: Decreased breath sounds present. No wheezing, rhonchi or rales.  Musculoskeletal:     Right lower leg: No edema.     Left lower leg: No edema.  Skin:    General: Skin is warm and dry.  Neurological:     Mental Status: She is alert.    CBC    Component Value Date/Time   WBC 13.0 (H) 12/26/2021 1300   RBC 4.52 12/26/2021 1300   HGB 13.4 12/26/2021 1300   HCT 41.5 12/26/2021 1300   PLT 307 12/26/2021 1300   MCV 91.8 12/26/2021 1300   MCH 29.6 12/26/2021 1300   MCHC 32.3 12/26/2021 1300   RDW 13.3 12/26/2021 1300   LYMPHSABS 2.8 08/02/2010 1501   MONOABS 0.7 08/02/2010 1501   EOSABS 0.4 08/02/2010 1501   BASOSABS 0.0 08/02/2010 1501      Latest Ref Rng & Units 02/27/2023   11:58 AM 12/26/2021    1:00 PM 07/04/2021   11:28 AM  BMP  Glucose 70 - 99 mg/dL 99  870  837   BUN 6 - 24 mg/dL 8  7  11    Creatinine 0.57 - 1.00 mg/dL 9.47  9.47  9.41   BUN/Creat Ratio 9 - 23 15   19    Sodium 134 - 144 mmol/L 140  137  139   Potassium 3.5 - 5.2 mmol/L 4.4  3.8  3.8   Chloride 96 - 106 mmol/L 98  103  98   CO2 20 - 29 mmol/L 24  28  28    Calcium 8.7 - 10.2 mg/dL 9.2  8.8  9.2    Chest imaging: CTA Chest 03/01/21 No evidence of pulmonary embolism or other acute intrathoracic process.  HRCT Chest 02/05/21 1. No findings to suggest interstitial lung disease. No acute findings in the thorax to account for the patient's symptoms.  CXR 07/02/20 The  lungs are clear without focal pneumonia, edema, pneumothorax or pleural effusion. The cardiopericardial silhouette is within normal limits for size. Thoracolumbar scoliosis.   CTA Chest 12/30/19 1. No demonstrable pulmonary embolus. No thoracic aortic aneurysm or dissection. 2. Minimal left base atelectasis. Lungs otherwise clear. 3. No evident thoracic adenopathy.   PFT:    Latest Ref Rng & Units 03/04/2021   10:58 AM  PFT Results  FVC-Pre L 3.08  FVC-Predicted Pre % 71   FVC-Post L 3.22   FVC-Predicted Post % 74   Pre FEV1/FVC % % 83   Post FEV1/FCV % % 84   FEV1-Pre L 2.56   FEV1-Predicted Pre % 73   FEV1-Post L 2.69   DLCO uncorrected ml/min/mmHg 20.62   DLCO UNC% % 80   DLCO corrected ml/min/mmHg 20.19   DLCO COR %Predicted % 78   DLVA Predicted % 103   TLC L 4.54   TLC % Predicted % 78   RV % Predicted % 82     Labs:  Path:  Echo 03/13/2020: LV ef 60-65%. LV diastolic parameters are normal. RV systolic function is normal. RV size is normal.   Heart Catheterization:  Assessment & Plan:   Mild intermittent reactive airway disease without complication  Chronic hypoxemic respiratory failure (HCC)  Discussion: Ellen Shaw is a 44 year old woman, former smoker with obesity, chronic hypoxemic respiratory failure and obstructive sleep apnea who returns to pulmonary clinic for follow up.   Obstructive Sleep Apnea -Continue CPAP therapy nightly with Dreamwear full face mask - will obtain download - check overnight oxygen test on CPAP  Chronic Hypoxemic Respiratory Failure In setting of obesity hypoventilation syndrome - Continue supplemental oxygen - will reach out to DME about POC  Obesity Hypoventilation Syndrome Weight Management Patient continues to work on weight loss -Encourage continued weight loss  Reactive airways disease - use budesonide  0.5mg  neb twice daily as needed - continue albuterol  nebds as needed  Follow up in 6 months.    Dorn Chill, MD [facility] Office: [phone]    Current Outpatient Medications:    albuterol  (VENTOLIN  HFA) 108 (90 Base) MCG/ACT inhaler, Inhale 2 puffs into the lungs every 6 (six) hours as needed for wheezing or shortness of breath., Disp: 8 g, Rfl: 6   ammonium lactate  (AMLACTIN) 12 % cream, Apply 1 Application topically as needed for dry skin. Apply to dry cracked skin, Disp: 385 g, Rfl: 0   atorvastatin (LIPITOR) 10 MG tablet, Take 10 mg by mouth daily., Disp: , Rfl:    budesonide  (PULMICORT ) 0.5 MG/2ML nebulizer solution, Take 2 mLs (0.5 mg total) by nebulization 2 (two) times daily., Disp: 120 mL, Rfl: 6   Cholecalciferol (VITAMIN D3) 1.25 MG (50000 UT) CAPS, Take 50,000 Units by mouth once a week., Disp: , Rfl:    diclofenac (VOLTAREN) 75 MG EC tablet, Take 75 mg by mouth 2 (two) times daily., Disp: , Rfl:    EPINEPHrine  0.3 mg/0.3 mL IJ SOAJ injection, Inject 0.3 mg into the muscle as needed for anaphylaxis., Disp: , Rfl:    FARXIGA 5 MG TABS tablet, Take 5 mg by mouth every morning., Disp: , Rfl:    flecainide  (TAMBOCOR ) 50 MG tablet, Take 1 tablet (50 mg total) by mouth 2 (two) times daily., Disp: 180 tablet, Rfl: 3   furosemide  (LASIX ) 40 MG tablet, Take 1 tablet (40 mg total) by mouth every other day. (Patient taking differently: Take 40 mg by mouth every other day. Taking Tuesday, Thursday, Saturday), Disp: 45 tablet, Rfl: 2   linaclotide (LINZESS) 290 MCG CAPS capsule, Take 290 mcg by mouth daily as needed., Disp: , Rfl:    lithium carbonate (LITHOBID) 300 MG ER tablet, Take 300 mg by mouth at bedtime., Disp: , Rfl:    loratadine (CLARITIN) 10 MG tablet, Take 10 mg by mouth daily as needed for allergies., Disp: , Rfl:    LORazepam (ATIVAN) 0.5 MG  tablet, Take 0.5 mg by mouth every 8 (eight) hours as needed for anxiety., Disp: , Rfl:    ondansetron  (ZOFRAN ) 4 MG tablet, Take 4 mg by mouth once., Disp: , Rfl:    OXYGEN, Inhale 2 L/mL into the  lungs See admin instructions., Disp: , Rfl:    Pediatric Multiple Vitamins (FLINTSTONES MULTIVITAMIN PO), Take 2 tablets by mouth daily., Disp: , Rfl:    potassium chloride  SA (KLOR-CON  M20) 20 MEQ tablet, Take 1 tablet (20 mEq total) by mouth every other day. (Patient taking differently: Take 20 mEq by mouth every other day. Taking Tuesday, Thursday, Saturday), Disp: 45 tablet, Rfl: 2   prazosin (MINIPRESS) 1 MG capsule, Take 1 mg by mouth at bedtime., Disp: , Rfl:    pregabalin (LYRICA) 50 MG capsule, Take 50 mg by mouth 2 (two) times daily. Taking 50 mg in am, 100 mg at night, Disp: , Rfl:    promethazine (PHENERGAN) 12.5 MG tablet, Take 12.5 mg by mouth every 6 (six) hours as needed for nausea or vomiting., Disp: , Rfl:    propranolol  (INDERAL ) 10 MG tablet, Take 1 tablet (10 mg total) by mouth at bedtime., Disp: 90 tablet, Rfl: 3   Ubrogepant (UBRELVY) 100 MG TABS, Take 100 mg by mouth as needed (migraines)., Disp: , Rfl:    vitamin B-12 (CYANOCOBALAMIN) 1000 MCG tablet, Take 1,000 mcg by mouth daily., Disp: , Rfl:    vortioxetine HBr (TRINTELLIX) 20 MG TABS tablet, Take 1 tablet (20 mg total) by mouth daily., Disp: , Rfl:

## 2023-10-14 NOTE — Telephone Encounter (Signed)
 Spoke with Ellen Shaw  - of [facility] they only do her Cpapa  - the O2  is through [facility] which there is a time limit when switching and will have to do another walk.

## 2023-10-22 ENCOUNTER — Telehealth: Payer: Self-pay

## 2023-10-22 NOTE — Telephone Encounter (Signed)
 Ppwk re-faxed today

## 2023-11-18 ENCOUNTER — Telehealth: Payer: Self-pay | Admitting: Pulmonary Disease

## 2023-11-18 NOTE — Telephone Encounter (Signed)
 ONO Results  Patient spent 44 minutes with an SpO2 of 88% or less on CPAP. Recommend she have a CPAP titration study to determine CPAP settings and titrate her oxygen need as well.  Thanks, JD

## 2023-11-19 ENCOUNTER — Other Ambulatory Visit: Payer: Self-pay

## 2023-11-19 DIAGNOSIS — G4733 Obstructive sleep apnea (adult) (pediatric): Secondary | ICD-10-CM

## 2023-11-19 NOTE — Telephone Encounter (Signed)
 VM/LM - okay per DPR - if any questions to return call  Order for titration study placed     -NFN

## 2023-11-20 ENCOUNTER — Telehealth: Payer: Self-pay | Admitting: *Deleted

## 2023-11-20 NOTE — Telephone Encounter (Signed)
 Left detailed message sleep study reviewed and titration study needed. Patient will be contacted to schedule.

## 2023-11-24 ENCOUNTER — Encounter: Payer: Self-pay | Admitting: Pulmonary Disease

## 2023-11-25 ENCOUNTER — Telehealth: Payer: Self-pay

## 2023-11-25 NOTE — Telephone Encounter (Signed)
 PPW refaxed 8/26

## 2023-12-29 ENCOUNTER — Telehealth: Payer: Self-pay

## 2023-12-29 ENCOUNTER — Ambulatory Visit: Admitting: Podiatry

## 2023-12-29 ENCOUNTER — Encounter: Payer: Self-pay | Admitting: Podiatry

## 2023-12-29 DIAGNOSIS — M216X2 Other acquired deformities of left foot: Secondary | ICD-10-CM

## 2023-12-29 DIAGNOSIS — E1142 Type 2 diabetes mellitus with diabetic polyneuropathy: Secondary | ICD-10-CM | POA: Diagnosis not present

## 2023-12-29 DIAGNOSIS — M79674 Pain in right toe(s): Secondary | ICD-10-CM

## 2023-12-29 DIAGNOSIS — L853 Xerosis cutis: Secondary | ICD-10-CM

## 2023-12-29 DIAGNOSIS — M79675 Pain in left toe(s): Secondary | ICD-10-CM

## 2023-12-29 DIAGNOSIS — L84 Corns and callosities: Secondary | ICD-10-CM

## 2023-12-29 DIAGNOSIS — M216X1 Other acquired deformities of right foot: Secondary | ICD-10-CM

## 2023-12-29 DIAGNOSIS — B351 Tinea unguium: Secondary | ICD-10-CM

## 2023-12-29 MED ORDER — AMMONIUM LACTATE 12 % EX CREA: 1.0000 | TOPICAL_CREAM | CUTANEOUS | 3 refills | Status: AC | PRN

## 2023-12-29 NOTE — Telephone Encounter (Signed)
 PPWK rcvd faxing to [facility]

## 2023-12-29 NOTE — Progress Notes (Signed)
 Chief Complaint  Patient presents with   [facility]    [facility] with callous A1c was 6.4 in June No anti coag    HPI: 43 y.o. female presents today for diabetic footcare.  She comes in with painful, thickened, elongated dystrophic toenails that she is not able to maintain herself due to their thickness and length.  They do cause pain with shoe gear.  She is diabetic, last A1c 6.4.  Also reports neuropathy symptoms.  She has been awaiting diabetic shoes since the spring, she is requesting new prescription.  Past Medical History:  Diagnosis Date   Alcohol abuse 02/21/2014   Allergic rhinitis 01/31/2008   Anxiety 07/22/2016   Arthritis 07/22/2016   Knees   Asthma 01/31/2008   Bell's palsy    age 15-14   Bilateral leg edema 02/15/2020   Chest discomfort 03/19/2020   Complex atypical endometrial hyperplasia 08/01/2013   Folliculitis 10/12/2017   GERD (gastroesophageal reflux disease) 12/03/2012   History of Helicobacter pylori infection 03/03/2017   Hyperlipidemia    Hypertension    Intertrigo 09/01/2017   Lumbosacral radiculopathy 09/01/2013   Major depressive disorder, recurrent episode 05/02/2014   Medication overuse headache 07/27/2014   Migraine with aura and without status migrainosus, not intractable 07/27/2014   Follows with Dr. Delsie in Neurology. HX pseudotumor cerebri   Mixed stress and urge urinary incontinence 08/12/2013   Morbid obesity (HCC)    NSVT (nonsustained ventricular tachycardia) (HCC) 03/19/2020   Obstructive sleep apnea on CPAP 12/27/2010   AHI=21.8 AutoPAP at 15-20 with an EPR=3   Other specific personality disorders (HCC)    Patellofemoral pain syndrome 10/12/2012   Posttraumatic stress disorder 02/04/2012   Prediabetes 06/30/2017   A1C 6.1 on 06/2017   Pseudotumor    PVC (premature ventricular contraction) 03/19/2020   Right bundle branch block 02/15/2020   Right cervical radiculopathy 07/18/2014   SVT (supraventricular tachycardia) 07/15/2016    Tobacco use 12/03/2011   Umbilical cyst 06/02/2011   Urinary retention 08/19/2013   S/P interstim placement with Urology   Vulvovaginal candidiasis 03/03/2017    Past Surgical History:  Procedure Laterality Date   BLADDER SURGERY     CHOLECYSTECTOMY N/A 01/09/2022   Procedure: LAPAROSCOPIC CHOLECYSTECTOMY;  Surgeon: Stevie Herlene Righter, MD;  Location: [facility];  Service: General;  Laterality: N/A;   EYELID LACERATION REPAIR Bilateral    NASAL SEPTUM SURGERY     TONSILLECTOMY AND ADENOIDECTOMY     VAGINAL HYSTERECTOMY  2015    Allergies  Allergen Reactions   Bee Venom Shortness Of Breath and Swelling   Ciprofloxacin Anaphylaxis, Shortness Of Breath and Rash   Clindamycin Shortness Of Breath   Esomeprazole Magnesium Hives and Shortness Of Breath   Nitrofurantoin Shortness Of Breath   Oxycodone Other (See Comments)    Other: I passed out. Other: convulsions shaking Other reaction(s): Dizziness, Hypotension, Wheezing (ALLERGY/intolerance)     Sulfa Antibiotics Shortness Of Breath and Rash   Trimethoprim     Other reaction(s): Rash, Rash, Shortness of breath   Citrus Hives   Doxycycline Other (See Comments)    Muscle spasms  Bad muscle spasms    Fluticasone Rash and Other (See Comments)    Headache and nosebleed     Penicillins Rash   Buspirone    Fentanyl  Other (See Comments)    I pass out.  *tolerated 06/11/2021 and 01/09/22 intraoperatively without complications*     Lexapro [Escitalopram]    Paliperidone Other (See Comments)  Other reaction(s): Dystonia   Pantoprazole Hives    Relieved with Benadryl    ROS    Physical Exam: There were no vitals filed for this visit.  General: The patient is alert and oriented x3 in no acute distress.  Dermatology: Dry cracked heels noted bilaterally.  Webspaces clean and dry without maceration or skin breakdown.  Pedal skin atrophic.  Nailplates x 10 are thickened, elongated, dystrophic greater than 3 mm with  yellow discoloration subungual debris.  Nail plates k89 are painful on direct dorsal palpation. Preulcerative calluses present subfifth metatarsal's bilaterally. Vascular: Faintly palpable DP and PT pulses.  Cap refill within normal limits.  Decreased pedal hair growth.  No diffuse pitting edema present.  Neurological: Light touch sensation grossly intact bilateral feet, protective sensation decreased.  Vibratory sensation decreased.  Subjective paresthesias noted.  Musculoskeletal Exam: Muscle strength 5/5 in dorsiflexion, plantarflexion, inversion, eversion.  Fat pad atrophy present.  Prominent fifth metatarsal heads plantarly noted.  Decreased first MPJ range of motion noted.    Assessment/Plan of Care: 1. DM type 2 with diabetic peripheral neuropathy (HCC)   2. Pain due to onychomycosis of toenails of both feet   3. Pre-ulcerative calluses   4. Plantar flexed metatarsal bone of left foot   5. Plantar flexed metatarsal bone of right foot      No orders of the defined types were placed in this encounter.  FOR HOME USE ONLY DME DIABETIC SHOE  Discussed clinical findings with patient today.  Patient educated on diabetes. Discussed proper diabetic foot care and discussed risks and complications of disease. Educated patient in depth on reasons to return to the office immediately should he/she discover anything concerning or new on the feet. All questions answered. Discussed proper shoes as well.  - Is awaiting diabetic shoes at this point.  Will follow-up on this - Will place new order in the interim with referral to bionic if there is ongoing difficulty getting the diabetic shoes and if new order is needed - Provide accommodation for fifth metatarsal eyes bilaterally - Currently taking Lyrica for neuropathy pain - Ammonium lactate  sent to patient's pharmacy for dry cracked skin on heels.  Apply once or twice a day as needed.   #Onychomycosis with pain  -Nails palliatively debrided as  below. -Educated on self-care  Procedure: Nail Debridement Rationale: Pain Type of Debridement: manual, sharp debridement. Instrumentation: Nail nipper, rotary burr. Number of Nails: 10  # Preulcerative callus present subfifth metatarsal head bilaterally -All symptomatic hyperkeratoses were safely debrided with a sterile #15 blade to patient's level of comfort without incident. We discussed preventative and palliative care of these lesions including supportive and accommodative shoegear, padding, prefabricated and custom molded accommodative orthoses, use of a pumice stone and lotions/creams daily.  Return in about 3 months for diabetic footcare.     Kevonta Phariss L. Lamount MAUL, AACFAS [facility]     [address] [place], [place] [address]                Office [phone]  Fax [phone]

## 2024-01-08 ENCOUNTER — Encounter: Payer: Self-pay | Admitting: *Deleted

## 2024-01-12 ENCOUNTER — Encounter: Payer: Self-pay | Admitting: Cardiology

## 2024-01-12 ENCOUNTER — Ambulatory Visit (INDEPENDENT_AMBULATORY_CARE_PROVIDER_SITE_OTHER): Admitting: Cardiology

## 2024-01-12 ENCOUNTER — Ambulatory Visit (HOSPITAL_BASED_OUTPATIENT_CLINIC_OR_DEPARTMENT_OTHER): Attending: Pulmonary Disease | Admitting: Pulmonary Disease

## 2024-01-12 VITALS — BP 122/84 | HR 86 | Ht 69.0 in | Wt 320.0 lb

## 2024-01-12 DIAGNOSIS — Z6841 Body Mass Index (BMI) 40.0 and over, adult: Secondary | ICD-10-CM | POA: Insufficient documentation

## 2024-01-12 DIAGNOSIS — I471 Supraventricular tachycardia, unspecified: Secondary | ICD-10-CM | POA: Insufficient documentation

## 2024-01-12 DIAGNOSIS — R5383 Other fatigue: Secondary | ICD-10-CM | POA: Insufficient documentation

## 2024-01-12 DIAGNOSIS — G4733 Obstructive sleep apnea (adult) (pediatric): Secondary | ICD-10-CM | POA: Insufficient documentation

## 2024-01-12 DIAGNOSIS — I493 Ventricular premature depolarization: Secondary | ICD-10-CM | POA: Insufficient documentation

## 2024-01-12 DIAGNOSIS — I10 Essential (primary) hypertension: Secondary | ICD-10-CM | POA: Diagnosis not present

## 2024-01-12 DIAGNOSIS — Z720 Tobacco use: Secondary | ICD-10-CM | POA: Insufficient documentation

## 2024-01-12 DIAGNOSIS — R42 Dizziness and giddiness: Secondary | ICD-10-CM | POA: Diagnosis not present

## 2024-01-12 DIAGNOSIS — Z79899 Other long term (current) drug therapy: Secondary | ICD-10-CM | POA: Insufficient documentation

## 2024-01-12 DIAGNOSIS — E669 Obesity, unspecified: Secondary | ICD-10-CM | POA: Diagnosis present

## 2024-01-12 DIAGNOSIS — J9611 Chronic respiratory failure with hypoxia: Secondary | ICD-10-CM | POA: Diagnosis present

## 2024-01-12 NOTE — Progress Notes (Signed)
 Cardiology Office Note:    Date:  01/12/2024   ID:  Ellen Shaw, DOB 09-May-1980, MRN [id number]  PCP:  Linda Achille RAMAN., MD  Cardiologist:  Dub Huntsman, DO  Electrophysiologist:  None   Referring MD: Linda Achille RAMAN., MD    I am doing well  History of Present Illness:    Ellen Shaw is a 43 y.o. female with a hx of chronic respiratory failure now on oxygen managed by her primary care doctor, hypertension, morbid obesity, nonsustained ventricular tachycardia, paroxysmal SVT on the monitor, recurrent palpitations here today for follow-up visit. Her last visit with me was on May 21, 2023 at that time her blood pressure was on the lower side.  The valsartan .  We continued the flecainide  and propranolol .  She was working with her psychiatrist for antidepressant medication.  Today she tells me that she has been experiencing significant fatigue as well as dizziness which she has been associating recently with the flecainide .  She tells me that she tested this and stopped the flecainide  and after she did that all of her symptoms improved significantly.  Currently she is off the flecainide .  She notes that the fatigue still lingers.   Past Medical History:  Diagnosis Date   Alcohol abuse 02/21/2014   Allergic rhinitis 01/31/2008   Anxiety 07/22/2016   Arthritis 07/22/2016   Knees   Asthma 01/31/2008   Bell's palsy    age 62-14   Bilateral leg edema 02/15/2020   Chest discomfort 03/19/2020   Complex atypical endometrial hyperplasia 08/01/2013   Folliculitis 10/12/2017   GERD (gastroesophageal reflux disease) 12/03/2012   History of Helicobacter pylori infection 03/03/2017   Hyperlipidemia    Hypertension    Intertrigo 09/01/2017   Lumbosacral radiculopathy 09/01/2013   Major depressive disorder, recurrent episode 05/02/2014   Medication overuse headache 07/27/2014   Migraine with aura and without status migrainosus, not intractable 07/27/2014   Follows with Dr.  Delsie in Neurology. HX pseudotumor cerebri   Mixed stress and urge urinary incontinence 08/12/2013   Morbid obesity (HCC)    NSVT (nonsustained ventricular tachycardia) (HCC) 03/19/2020   Obstructive sleep apnea on CPAP 12/27/2010   AHI=21.8 AutoPAP at 15-20 with an EPR=3   Other specific personality disorders (HCC)    Patellofemoral pain syndrome 10/12/2012   Posttraumatic stress disorder 02/04/2012   Prediabetes 06/30/2017   A1C 6.1 on 06/2017   Pseudotumor    PVC (premature ventricular contraction) 03/19/2020   Right bundle branch block 02/15/2020   Right cervical radiculopathy 07/18/2014   SVT (supraventricular tachycardia) 07/15/2016   Tobacco use 12/03/2011   Umbilical cyst 06/02/2011   Urinary retention 08/19/2013   S/P interstim placement with Urology   Vulvovaginal candidiasis 03/03/2017    Past Surgical History:  Procedure Laterality Date   BLADDER SURGERY     CHOLECYSTECTOMY N/A 01/09/2022   Procedure: LAPAROSCOPIC CHOLECYSTECTOMY;  Surgeon: Stevie Herlene Righter, MD;  Location: [facility];  Service: General;  Laterality: N/A;   EYELID LACERATION REPAIR Bilateral    NASAL SEPTUM SURGERY     TONSILLECTOMY AND ADENOIDECTOMY     VAGINAL HYSTERECTOMY  2015    Current Medications: Current Meds  Medication Sig   albuterol  (VENTOLIN  HFA) 108 (90 Base) MCG/ACT inhaler Inhale 2 puffs into the lungs every 6 (six) hours as needed for wheezing or shortness of breath.   ammonium lactate  (AMLACTIN) 12 % cream Apply 1 Application topically as needed for dry skin. Apply to dry cracked skin   atorvastatin (LIPITOR)  10 MG tablet Take 10 mg by mouth daily.   budesonide  (PULMICORT ) 0.5 MG/2ML nebulizer solution Take 2 mLs (0.5 mg total) by nebulization 2 (two) times daily.   Cholecalciferol (VITAMIN D3) 1.25 MG (50000 UT) CAPS Take 50,000 Units by mouth once a week.   EPINEPHrine  0.3 mg/0.3 mL IJ SOAJ injection Inject 0.3 mg into the muscle as needed for anaphylaxis.   FARXIGA 5 MG TABS  tablet Take 5 mg by mouth every morning.   furosemide  (LASIX ) 40 MG tablet Take 1 tablet (40 mg total) by mouth every other day. (Patient taking differently: Take 40 mg by mouth every other day. Taking Tuesday, Thursday, Saturday)   linaclotide (LINZESS) 290 MCG CAPS capsule Take 290 mcg by mouth daily as needed.   lithium carbonate (LITHOBID) 300 MG ER tablet Take 300 mg by mouth at bedtime.   loratadine (CLARITIN) 10 MG tablet Take 10 mg by mouth daily as needed for allergies.   LORazepam (ATIVAN) 0.5 MG tablet Take 0.5 mg by mouth every 8 (eight) hours as needed for anxiety.   ondansetron  (ZOFRAN ) 4 MG tablet Take 4 mg by mouth once.   OXYGEN Inhale 2 L/mL into the lungs See admin instructions.   Pediatric Multiple Vitamins (FLINTSTONES MULTIVITAMIN PO) Take 2 tablets by mouth daily.   potassium chloride  SA (KLOR-CON  M20) 20 MEQ tablet Take 1 tablet (20 mEq total) by mouth every other day. (Patient taking differently: Take 20 mEq by mouth every other day. Taking Tuesday, Thursday, Saturday)   prazosin (MINIPRESS) 1 MG capsule Take 1 mg by mouth at bedtime.   pregabalin (LYRICA) 50 MG capsule Take 50 mg by mouth 2 (two) times daily. Taking 50 mg in am, 100 mg at night   propranolol  (INDERAL ) 10 MG tablet Take 1 tablet (10 mg total) by mouth at bedtime.   Ubrogepant (UBRELVY) 100 MG TABS Take 100 mg by mouth as needed (migraines).   vitamin B-12 (CYANOCOBALAMIN) 1000 MCG tablet Take 1,000 mcg by mouth daily.   [DISCONTINUED] flecainide  (TAMBOCOR ) 50 MG tablet Take 1 tablet (50 mg total) by mouth 2 (two) times daily.     Allergies:   Bee venom, Ciprofloxacin, Clindamycin, Esomeprazole magnesium, Nitrofurantoin, Oxycodone, Sulfa antibiotics, Trimethoprim, Citrus, Doxycycline, Fluticasone, Penicillins, Buspirone, Fentanyl , Lexapro [escitalopram], Paliperidone, and Pantoprazole   Social History   Socioeconomic History   Marital status: Single    Spouse name: Not on file   Number of children:  Not on file   Years of education: Not on file   Highest education level: Not on file  Occupational History   Not on file  Tobacco Use   Smoking status: Former    Current packs/day: 0.00    Types: Cigarettes    Quit date: 01/19/2020    Years since quitting: 3.9   Smokeless tobacco: Never  Vaping Use   Vaping status: Never Used  Substance and Sexual Activity   Alcohol use: Not Currently   Drug use: No   Sexual activity: Not on file  Other Topics Concern   Not on file  Social History Narrative   Not on file   Social Drivers of Health   Financial Resource Strain: Not on file  Food Insecurity: Low Risk  (07/06/2023)   Received from [facility]   Hunger Vital Sign    Within the past 12 months, you worried that your food would run out before you got money to buy more: Never true    Within the past 12 months, the food  you bought just didn't last and you didn't have money to get more. : Never true  Transportation Needs: No Transportation Needs (07/06/2023)   Received from [facility]    In the past 12 months, has lack of reliable transportation kept you from medical appointments, meetings, work or from getting things needed for daily living? : No  Physical Activity: Not on file  Stress: Not on file  Social Connections: Not on file     Family History: The patient's family history includes Atrial fibrillation in her father; COPD in her father; Congestive Heart Failure in her father; Glaucoma in her father; Heart attack in her maternal grandmother; High Cholesterol in her mother; Hypertension in her father; Kidney disease in her father; Lung cancer in her father and paternal grandmother; Macular degeneration in her father; Stroke in her father.  ROS:   Review of Systems  Constitution: Negative for decreased appetite, fever and weight gain.  HENT: Negative for congestion, ear discharge, hoarse voice and sore throat.   Eyes: Negative for discharge, redness, vision  loss in right eye and visual halos.  Cardiovascular: Negative for chest pain, dyspnea on exertion, leg swelling, orthopnea and palpitations.  Respiratory: Negative for cough, hemoptysis, shortness of breath and snoring.   Endocrine: Negative for heat intolerance and polyphagia.  Hematologic/Lymphatic: Negative for bleeding problem. Does not bruise/bleed easily.  Skin: Negative for flushing, nail changes, rash and suspicious lesions.  Musculoskeletal: Negative for arthritis, joint pain, muscle cramps, myalgias, neck pain and stiffness.  Gastrointestinal: Negative for abdominal pain, bowel incontinence, diarrhea and excessive appetite.  Genitourinary: Negative for decreased libido, genital sores and incomplete emptying.  Neurological: Negative for brief paralysis, focal weakness, headaches and loss of balance.  Psychiatric/Behavioral: Negative for altered mental status, depression and suicidal ideas.  Allergic/Immunologic: Negative for HIV exposure and persistent infections.    EKGs/Labs/Other Studies Reviewed:    The following studies were reviewed today:   EKG:    CCTA  Aorta: Normal size.  No calcifications.  No dissection.   Aortic Valve:  Trileaflet.  No calcifications.   Coronary Arteries:  Normal coronary origin.  Right dominance.   RCA is a large dominant artery that gives rise to PDA and PLVB. There is no plaque.   Left main is a large artery that gives rise to LAD and LCX arteries.   LAD is a large vessel that has no plaque.   LCX is a non-dominant artery that gives rise to one large OM1 branch. There is no plaque.   Other findings:   Normal pulmonary vein drainage into the left atrium.   Normal left atrial appendage without a thrombus.   Normal size of the pulmonary artery.   IMPRESSION: 1. Coronary calcium score of 0. This was 0 percentile for age and sex matched control.   2. Normal coronary origin with right dominance.   3. No evidence of CAD.      Zio monitor The patient wore the monitor for 14 days starting February 15, 2020   . Indication: Palpitations   The minimum heart rate was 47 bpm, maximum heart rate was 240 bpm, and average heart rate was 85 bpm. Predominant underlying rhythm was Sinus Rhythm.  First-degree AV block was present.   9 Ventricular Tachycardia runs occurred, the run with the fastest interval lasting 5 beats with a maximum rate of 240 bpm, the longest lasting 5 beats with an average rate of 184 bpm.   Premature atrial complexes were rare less  than 1%. Premature Ventricular complexes rare less than 1%.   No pauses, No AV block and no atrial fibrillation present. 14 patient triggered events and 13 diary events associated with premature ventricular complex.   Conclusion: This study is remarkable for the following:                             1.  Nonsustained ventricular tachycardia                             2.  Rare symptomatic premature ventricular complex.   Transthoracic Echocardiogram IMPRESSIONS  1. Left ventricular ejection fraction, by estimation, is 60 to 65%. The left ventricle has normal function. The left ventricle has no regional wall motion abnormalities. Left ventricular diastolic parameters were normal.   2. Right ventricular systolic function is normal. The right ventricular size is normal.   3. The mitral valve is normal in structure. No evidence of mitral valve regurgitation. No evidence of mitral stenosis.   4. The aortic valve is normal in structure. Aortic valve regurgitation is not visualized. No aortic stenosis is present.   5. The inferior vena cava is normal in size with greater than 50% respiratory variability, suggesting right atrial pressure of 3 mmHg.   FINDINGS   Left Ventricle: Left ventricular ejection fraction, by estimation, is 60 to 65%. The left ventricle has normal function. The left ventricle has no regional wall motion abnormalities. Definity  contrast agent was given IV to  delineate the left ventricular   endocardial borders. The left ventricular internal cavity size was normal in size. There is no left ventricular hypertrophy. Left ventricular diastolic parameters were normal.   Right Ventricle: The right ventricular size is normal. No increase in right ventricular wall thickness. Right ventricular systolic function is  normal.   Left Atrium: Left atrial size was normal in size.   Right Atrium: Right atrial size was normal in size.   Pericardium: There is no evidence of pericardial effusion.   Mitral Valve: The mitral valve is normal in structure. No evidence of  mitral valve regurgitation. No evidence of mitral valve stenosis.   Tricuspid Valve: The tricuspid valve is normal in structure. Tricuspid  valve regurgitation is trivial. No evidence of tricuspid stenosis.   Aortic Valve: The aortic valve is normal in structure. Aortic valve  regurgitation is not visualized. No aortic stenosis is present.   Pulmonic Valve: The pulmonic valve was normal in structure. Pulmonic valve  regurgitation is not visualized. No evidence of pulmonic stenosis.   Aorta: The aortic root is normal in size and structure.   Venous: The inferior vena cava is normal in size with greater than 50%  respiratory variability, suggesting right atrial pressure of 3 mmHg.   IAS/Shunts: No atrial level shunt detected by color flow Doppler.     ZIO monitor 2022 Patch Wear Time:  13 days and 14 hours starting November 12, 2020. Indication palpitation   Patient had a min HR of 51 bpm, max HR of 158 bpm, and avg HR of 78 bpm. Predominant underlying rhythm was Sinus Rhythm. First Degree AV Block was present.    8 Supraventricular Tachycardia runs occurred, the run with the fastest interval lasting 5 beats with a max rate of 158 bpm (avg 128 bpm); the run with the fastest interval was  also the longest. Isolated SVEs were rare (<1.0%), SVE Couplets were rare (<  1.0%), and SVE Triplets were  rare (<1.0%). Isolated VEs were rare (<1.0%), VE Couplets were rare (<1.0%), and no VE Triplets were present. Ventricular Bigeminy and Trigeminy  were present.   Symptoms were associated with sinus rhythm and PVC.   No atrial fibrillation.   Conclusion: This study is remarkable for paroxysmal supraventricular tachycardia    Recent Labs: 02/27/2023: ALT 10; BUN 8; Creatinine, Ser 0.52; Magnesium 2.1; Potassium 4.4; Sodium 140  Recent Lipid Panel No results found for: CHOL, TRIG, HDL, CHOLHDL, VLDL, LDLCALC, LDLDIRECT  Physical Exam:    VS:  BP 122/84 (BP Location: Left Arm, Patient Position: Sitting, Cuff Size: Large)   Pulse 86   Ht 5' 9 (1.753 m)   Wt (!) 320 lb (145.2 kg)   SpO2 95%   BMI 47.26 kg/m     Wt Readings from Last 3 Encounters:  01/12/24 (!) 320 lb (145.2 kg)  10/14/23 (!) 312 lb (141.5 kg)  06/03/23 299 lb (135.6 kg)     GEN: Well nourished, well developed in no acute distress HEENT: Normal NECK: No JVD; No carotid bruits LYMPHATICS: No lymphadenopathy CARDIAC: S1S2 noted,RRR, no murmurs, rubs, gallops RESPIRATORY:  Clear to auscultation without rales, wheezing or rhonchi  ABDOMEN: Soft, non-tender, non-distended, +bowel sounds, no guarding. EXTREMITIES: No edema, No cyanosis, no clubbing MUSCULOSKELETAL:  No deformity  SKIN: Warm and dry NEUROLOGIC:  Alert and oriented x 3, non-focal PSYCHIATRIC:  Normal affect, good insight  ASSESSMENT:    1. Primary hypertension   2. Dizziness   3. Fatigue, unspecified type   4. Medication management   5. PSVT (paroxysmal supraventricular tachycardia)   6. PVC's (premature ventricular contractions)   7. OSA on CPAP   8. Morbid obesity (HCC)   9. Tobacco use     PLAN:   Blood pressure is acceptable, continue with current antihypertensive regimen.   PSVT-she has not been taking her flecainide  for some days now.  Will hold it for now.  She will continue the propranolol .  Should her symptoms  relapse we will discuss possibility of her medications.  With the fatigue it would be beneficial to get a repeat echocardiogram to reassess for LV function as well as to assess right-sided heart pressure in the setting of her sleep apnea as well.   The patient understands the need to lose weight with diet and exercise. We have discussed specific strategies for this.  Continued CPAP use.  Hyperlipidemia - continue statin use   Blood pressure is acceptable, continue with current antihypertensive regimen.  Tobacco cessation-will give the patient information for virtual smoking cessation program  Will get blood work today.  The patient is in agreement with the above plan. The patient left the office in stable condition.  The patient will follow up in 6 months   Medication Adjustments/Labs and Tests Ordered: Current medicines are reviewed at length with the patient today.  Concerns regarding medicines are outlined above.  Orders Placed This Encounter  Procedures   Comprehensive metabolic panel with GFR   Magnesium   CBC   EKG 12-Lead   ECHOCARDIOGRAM COMPLETE   No orders of the defined types were placed in this encounter.   Patient Instructions  Medication Instructions:   Stop taking Flecainide    *If you need a refill on your cardiac medications before your next appointment, please call your pharmacy*   Lab Work: CMP MAGNESIUM  CBC If you have labs (blood work) drawn today and your tests are completely normal, you will receive  your results only by: MyChart Message (if you have MyChart) OR A paper copy in the mail If you have any lab test that is abnormal or we need to change your treatment, we will call you to review the results.   Testing/Procedure  Schedule at the [facility] office  Your physician has requested that you have an echocardiogram. Echocardiography is a painless test that uses sound waves to create images of your heart. It provides your doctor with  information about the size and shape of your heart and how well your heart's chambers and valves are working. This procedure takes approximately one hour. There are no restrictions for this procedure. Please do NOT wear cologne, perfume, aftershave, or lotions (deodorant is allowed). Please arrive 15 minutes prior to your appointment time.  Please note: We ask at that you not bring children with you during ultrasound (echo/ vascular) testing. Due to room size and safety concerns, children are not allowed in the ultrasound rooms during exams. Our front office staff cannot provide observation of children in our lobby area while testing is being conducted. An adult accompanying a patient to their appointment will only be allowed in the ultrasound room at the discretion of the ultrasound technician under special circumstances. We apologize for any inconvenience.     Follow-Up: At [facility], you and your health needs are our priority.  As part of our continuing mission to provide you with exceptional heart care, we have created designated Provider Care Teams.  These Care Teams include your primary Cardiologist (physician) and Advanced Practice Providers (APPs -  Physician Assistants and Nurse Practitioners) who all work together to provide you with the care you need, when you need it.     Your next appointment:   6 month(s)  The format for your next appointment:   In Person  Provider:   Tuwanda Vokes, DO   Other Instructions       Adopting a Healthy Lifestyle.  Know what a healthy weight is for you (roughly BMI <25) and aim to maintain this   Aim for 7+ servings of fruits and vegetables daily   65-80+ fluid ounces of water or unsweet tea for healthy kidneys   Limit to max 1 drink of alcohol per day; avoid smoking/tobacco   Limit animal fats in diet for cholesterol and heart health - choose grass fed whenever available   Avoid highly processed foods, and foods high in  saturated/trans fats   Aim for low stress - take time to unwind and care for your mental health   Aim for 150 min of moderate intensity exercise weekly for heart health, and weights twice weekly for bone health   Aim for 7-9 hours of sleep daily   When it comes to diets, agreement about the perfect plan isnt easy to find, even among the experts. Experts at the [facility] [employer] developed an idea known as the Healthy Eating Plate. Just imagine a plate divided into logical, healthy portions.   The emphasis is on diet quality:   Load up on vegetables and fruits - one-half of your plate: Aim for color and variety, and remember that potatoes dont count.   Go for whole grains - one-quarter of your plate: Whole wheat, barley, wheat berries, quinoa, oats, brown rice, and foods made with them. If you want pasta, go with whole wheat pasta.   Protein power - one-quarter of your plate: Fish, chicken, beans, and nuts are all healthy, versatile protein sources. Limit  red meat.   The diet, however, does go beyond the plate, offering a few other suggestions.   Use healthy plant oils, such as olive, canola, soy, corn, sunflower and peanut. Check the labels, and avoid partially hydrogenated oil, which have unhealthy trans fats.   If youre thirsty, drink water. Coffee and tea are good in moderation, but skip sugary drinks and limit milk and dairy products to one or two daily servings.   The type of carbohydrate in the diet is more important than the amount. Some sources of carbohydrates, such as vegetables, fruits, whole grains, and beans-are healthier than others.   Finally, stay active  Signed, Dub Huntsman, DO  01/12/2024 10:34 AM    [facility]

## 2024-01-12 NOTE — Patient Instructions (Signed)
 Medication Instructions:   Stop taking Flecainide    *If you need a refill on your cardiac medications before your next appointment, please call your pharmacy*   Lab Work: CMP MAGNESIUM  CBC If you have labs (blood work) drawn today and your tests are completely normal, you will receive your results only by: MyChart Message (if you have MyChart) OR A paper copy in the mail If you have any lab test that is abnormal or we need to change your treatment, we will call you to review the results.   Testing/Procedure  Schedule at the [facility] office  Your physician has requested that you have an echocardiogram. Echocardiography is a painless test that uses sound waves to create images of your heart. It provides your doctor with information about the size and shape of your heart and how well your heart's chambers and valves are working. This procedure takes approximately one hour. There are no restrictions for this procedure. Please do NOT wear cologne, perfume, aftershave, or lotions (deodorant is allowed). Please arrive 15 minutes prior to your appointment time.  Please note: We ask at that you not bring children with you during ultrasound (echo/ vascular) testing. Due to room size and safety concerns, children are not allowed in the ultrasound rooms during exams. Our front office staff cannot provide observation of children in our lobby area while testing is being conducted. An adult accompanying a patient to their appointment will only be allowed in the ultrasound room at the discretion of the ultrasound technician under special circumstances. We apologize for any inconvenience.     Follow-Up: At [facility], you and your health needs are our priority.  As part of our continuing mission to provide you with exceptional heart care, we have created designated Provider Care Teams.  These Care Teams include your primary Cardiologist (physician) and Advanced Practice Providers (APPs -  Physician  Assistants and Nurse Practitioners) who all work together to provide you with the care you need, when you need it.     Your next appointment:   6 month(s)  The format for your next appointment:   In Person  Provider:   Kardie Tobb, DO   Other Instructions

## 2024-01-13 LAB — CBC
Hematocrit: 42.4 % (ref 34.0–46.6)
Hemoglobin: 14.1 g/dL (ref 11.1–15.9)
MCH: 30.5 pg (ref 26.6–33.0)
MCHC: 33.3 g/dL (ref 31.5–35.7)
MCV: 92 fL (ref 79–97)
Platelets: 347 x10E3/uL (ref 150–450)
RBC: 4.63 x10E6/uL (ref 3.77–5.28)
RDW: 11.7 % (ref 11.7–15.4)
WBC: 14.6 x10E3/uL — ABNORMAL HIGH (ref 3.4–10.8)

## 2024-01-13 LAB — COMPREHENSIVE METABOLIC PANEL WITH GFR
ALT: 10 IU/L (ref 0–32)
AST: 10 IU/L (ref 0–40)
Albumin: 4 g/dL (ref 3.9–4.9)
Alkaline Phosphatase: 88 IU/L (ref 41–116)
BUN/Creatinine Ratio: 15 (ref 9–23)
BUN: 9 mg/dL (ref 6–24)
Bilirubin Total: 0.4 mg/dL (ref 0.0–1.2)
CO2: 27 mmol/L (ref 20–29)
Calcium: 9.4 mg/dL (ref 8.7–10.2)
Chloride: 98 mmol/L (ref 96–106)
Creatinine, Ser: 0.61 mg/dL (ref 0.57–1.00)
Globulin, Total: 2.7 g/dL (ref 1.5–4.5)
Glucose: 146 mg/dL — ABNORMAL HIGH (ref 70–99)
Potassium: 4.4 mmol/L (ref 3.5–5.2)
Sodium: 138 mmol/L (ref 134–144)
Total Protein: 6.7 g/dL (ref 6.0–8.5)
eGFR: 113 mL/min/1.73 (ref >59)

## 2024-01-13 LAB — MAGNESIUM: Magnesium: 2.1 mg/dL (ref 1.6–2.3)

## 2024-01-19 NOTE — Procedures (Signed)
 Darryle Law [facility] [address] [place], [place] [address] Tel: [phone]   Fax: [phone]  Titration Interpretation  Patient Name:  Ellen Shaw, Ellen Shaw Study Date:  01/12/2024 Referring Physician:  DORN CHILL [phone]) %%startinterp%% Indications for Polysomnography The patient is a 43 year old Female who is 5' 9 and weighs 320.0 lbs. Her BMI equals 47.4.  A full night titration treatment study was performed. She has obesity, chronic hypoxemic respiratory failure and obstructive sleep apnea with nocturnal oximetry showing desaturation x 44 mins  Medications taken at 2120.  LAMOTRIGINE  HYDROXYZ HCL   Polysomnogram Data A full night polysomnogram recorded the standard physiologic parameters including EEG, EOG, EMG, EKG, nasal and oral airflow.  Respiratory parameters of chest and abdominal movements were recorded with Respiratory Inductance Plethysmography belts.  Oxygen saturation was recorded by pulse oximetry.   Sleep Architecture The total recording time of the polysomnogram was 393.1 minutes.  The total sleep time was 355.0 minutes.  The patient spent 3.9% of total sleep time in Stage N1, 39.7% in Stage N2, 43.5% in Stages N3, and 12.8% in REM.  Sleep latency was 0.6 minutes.  REM latency was 68.5 minutes.  Sleep Efficiency was 90.3%.  Wake after Sleep Onset time was 37.0 minutes.  Titration Summary The patient was titrated at pressures ranging from 16/3** cm/H20 up to 22/17/0 cm/H20.The last pressure used in the study was 22/17/0 cm/H20   Respiratory Events The polysomnogram revealed a presence of 21 obstructive, 23 central, and 3 mixed apneas resulting in an Apnea index of 7.9 events per hour.  There were 102 hypopneas (>=3% desaturation and/or arousal) resulting in an Apnea\Hypopnea Index (AHI >=3% desaturation and/or arousal) of 25.2 events per hour.  There were 42 hypopneas (>=4% desaturation) resulting in an Apnea\Hypopnea Index (AHI >=4%  desaturation) of 15.0 events per hour.  There were 89 Respiratory Effort Related Arousals resulting in a RERA index of 15.0 events per hour. The Respiratory Disturbance Index is 40.2 events per hour.  The snore index was 48.0 events per hour.  Mean oxygen saturation was 92.6%.  The lowest oxygen saturation during sleep was 74.0%.  Time spent <=88% oxygen saturation was 12.5 minutes (3.2%).  Limb Activity There were 145 limb movements recorded.  Of this total, 145 were classified as PLMs.  Of the PLMs, - were associated with arousals.  The Limb Movement index was 24.5 per hour while the PLM index was 24.5 per hour.  Cardiac Summary The average pulse rate was 64.1 bpm.  The minimum pulse rate was 54.0 bpm while the maximum pulse rate was 83.0 bpm.  Cardiac rhythm was normal/abnormal.  Comments: CPAP was started at a pressure of 6 cm H2O and was increased to a pressure of 20 cm H2O. An EPR of 3 was set during the last few pressures of CPAP for arousals and very minor central apnea. Due to maxxing out on CPAP while still having events, the patient was switched to bilevel at a starting pressure of 21/17 cm H2O and was increased to a pressure of 22/17 cm H2O. The patient tolerated the pressures and pressure changes well.            The patient slept in the supine position. Mild PLMs and oral venting were observed. No bruxism, seizure, or spike wave activity was observed. Snoring was mild and not audible. Snoring was controlled with PAP therapy. No ECG events were observed. All stages of sleep were recorded. Supine REM was achieved, but not at  the final pressure.  Diagnosis: OSA CPAP 15 cm appears to be adequate although bilevel was also tried during the study without significant improvement    Recommendations: Suggest trial of auto CPAP 15 to 20 cm with medium fullface mask If events persist on clinical download then consider trial of auto bilevel to target pressure of 21/17 cm No oxygen was necessary  at these levels of PAP    This study was personally reviewed and electronically signed by: JUDE HARDEN GAILS., MD Accredited Board Certified in Sleep Medicine

## 2024-01-21 ENCOUNTER — Encounter: Payer: Self-pay | Admitting: Pulmonary Disease

## 2024-01-25 ENCOUNTER — Ambulatory Visit: Payer: Self-pay | Admitting: Cardiology

## 2024-02-02 ENCOUNTER — Telehealth: Payer: Self-pay

## 2024-02-02 NOTE — Telephone Encounter (Signed)
 Please let patient know the sleep medicine team suggests trial of auto CPAP with humidificaiton 15 to 20 cm with medium fullface mask.  Please place order for CPAP machine, supplies and face mask fitting session.  Thanks, JD

## 2024-02-02 NOTE — Telephone Encounter (Signed)
 Spoke with patient unable to give answer at this time on wether to place order for Cpap due to a family need. Will reach out when she can.   Order info :  DME: [facility]   Cpap :sleep medicine team suggests trial of auto CPAP with humidificaiton 15 to 20 cm with medium fullface mask.

## 2024-02-03 ENCOUNTER — Ambulatory Visit: Attending: Cardiology

## 2024-02-03 DIAGNOSIS — I10 Essential (primary) hypertension: Secondary | ICD-10-CM | POA: Diagnosis present

## 2024-02-03 DIAGNOSIS — R5383 Other fatigue: Secondary | ICD-10-CM | POA: Insufficient documentation

## 2024-02-03 LAB — ECHOCARDIOGRAM COMPLETE
AR max vel: 3.32 cm2
AV Area VTI: 3.2 cm2
AV Area mean vel: 3.11 cm2
AV Mean grad: 3 mmHg
AV Peak grad: 5.7 mmHg
Ao pk vel: 1.19 m/s
Area-P 1/2: 6.48 cm2
Calc EF: 67.4 %
MV VTI: 1.79 cm2
S' Lateral: 2.6 cm
Single Plane A2C EF: 67.2 %
Single Plane A4C EF: 69.2 %

## 2024-02-03 MED ORDER — PERFLUTREN LIPID MICROSPHERE
1.0000 mL | INTRAVENOUS | Status: AC | PRN
  Administered 2024-02-03: 4 mL via INTRAVENOUS

## 2024-02-25 ENCOUNTER — Other Ambulatory Visit: Payer: Self-pay

## 2024-02-25 DIAGNOSIS — G4733 Obstructive sleep apnea (adult) (pediatric): Secondary | ICD-10-CM

## 2024-02-26 ENCOUNTER — Telehealth: Payer: Self-pay

## 2024-02-26 ENCOUNTER — Other Ambulatory Visit (HOSPITAL_COMMUNITY): Payer: Self-pay

## 2024-02-26 NOTE — Telephone Encounter (Signed)
*  Pulm  Pharmacy Patient Advocate Encounter   Received notification from Fax that prior authorization for Budesonide  neb sol is required/requested.   Insurance verification completed.   The patient is insured through [employer].   Per test claim: Medication is not eligible for pharmacy benefits and must be billed through medical insurance. As our team only handles pharmacy related prior auths, medical PA's must be submitted by the clinic. Thank you  *Must be billed to Medicare Part B-note is on prescription

## 2024-03-01 ENCOUNTER — Telehealth: Payer: Self-pay | Admitting: Podiatry

## 2024-03-01 NOTE — Telephone Encounter (Signed)
 [employer]ionic Referral Diabetic shoes with 3 sets of multidensity insoles  Tried returning call to [facility] unable to reach.

## 2024-03-01 NOTE — Telephone Encounter (Signed)
 Rosina from [facility] called in regards to needing clarification about diabetic shoe orders so that patient can get diabetic shoes. She mentioned needing paperwork about what the provider recommends. Her telephone number is [phone].

## 2024-03-02 NOTE — Telephone Encounter (Signed)
 I faxed order to [facility] for nurse review has requested.

## 2024-03-29 ENCOUNTER — Ambulatory Visit: Admitting: Podiatry

## 2024-03-29 ENCOUNTER — Encounter: Payer: Self-pay | Admitting: Podiatry

## 2024-03-29 DIAGNOSIS — E1142 Type 2 diabetes mellitus with diabetic polyneuropathy: Secondary | ICD-10-CM

## 2024-03-29 DIAGNOSIS — L600 Ingrowing nail: Secondary | ICD-10-CM

## 2024-03-29 DIAGNOSIS — L84 Corns and callosities: Secondary | ICD-10-CM

## 2024-03-29 DIAGNOSIS — B351 Tinea unguium: Secondary | ICD-10-CM

## 2024-03-29 NOTE — Progress Notes (Signed)
"  °  Subjective:  Patient ID: Ellen Shaw, female    DOB: [birth year],  MRN: [id number]  Chief Complaint  Patient presents with   [facility]    [facility] with callus. no anti coag.     44 y.o. female presents with the above complaint. History confirmed with patient. Patient presenting with pain related to dystrophic thickened elongated nails. Patient is unable to trim own nails related to nail dystrophy and/or mobility issues. Patient does have a history of T2DM.  She is unsure of her last A1c she reports it is in the low sixes.  Patient is unsure patient does have callus present located at the left subfifth metatarsal head causing pain.  Reporting increased pain to right first toe nail plate and left first toe lateral border today.  Objective:  Physical Exam: warm, good capillary refill, decreased pedal hair growth, pedal skin atrophic nail exam onychomycosis of the toenails, onycholysis, and dystrophic nails greater than 3 mm thickening DP pulses faintly palpable, PT pulses faintly palpable, protective sensation decreased, and vibratory sensation diminished, subjective paresthesias noted Left Foot:  Pain with palpation of nails due to elongation and dystrophic growth.  Tenderness and incurvation of left first toe nail plate lateral border without evidence of acute bacterial infection.  Preulcerative callus present left subfifth metatarsal head Right Foot: Pain with palpation of nails due to elongation and dystrophic growth.  Right first toenail especially dystrophic and tender on palpation  Assessment:   1. DM type 2 with diabetic peripheral neuropathy (HCC)   2. Pain due to onychomycosis of toenails of both feet   3. Pre-ulcerative calluses   4. Ingrown toenail of both feet      Plan:  Patient was evaluated and treated and all questions answered.  #Hyperkeratotic lesions/pre ulcerative calluses present left subfifth metatarsal head All symptomatic hyperkeratoses x 1 separate lesions were  safely debrided with a sterile #312 blade to patient's level of comfort without incident. We discussed preventative and palliative care of these lesions including supportive and accommodative shoegear, padding, prefabricated and custom molded accommodative orthoses, use of a pumice stone and lotions/creams daily.  #Onychomycosis with pain  -Nails palliatively debrided as below. -Educated on self-care  Procedure: Nail Debridement Rationale: Pain Type of Debridement: manual, sharp debridement. Instrumentation: Nail nipper, rotary burr. Number of Nails: 10  # Ingrowing toenails left first and right first toe - Discussed findings of pain associated with dystrophic nail with bilateral incurvated borders right first toe and incurvated border left first toe. - Did attempt slant back nail trim today. - Discussed proceeding with PNA procedure.  She is interested in proceeding with this at a later date we will have her follow-up in 2 to 3 weeks for this. - Plan for left hallux lateral border and right first toe total nail avulsions with chemical matrixectomy.  Risk, benefits alternative therapies discussed with patient. - In the meantime may soak feet in warm Epsom salts and apply antibacterial ointment and Band-Aid to the affected nail borders. #Onychomycosis with pain  -Nails palliatively debrided as below. -Educated on self-care  Procedure: Nail Debridement Rationale: Pain Type of Debridement: manual, sharp debridement. Instrumentation: Nail nipper, rotary burr. Number of Nails: 10   Return in about 3 weeks (around 04/19/2024) for Ingrown toenail.         Ethan Saddler, DPM [facility] / [facility]                     "

## 2024-03-29 NOTE — Patient Instructions (Signed)
 Place 1/4 cup of epsom salts in a quart of warm tap water.  Submerge your foot or feet in the solution and soak for 20 minutes.  This soak should be done twice a day.  Next, remove your foot or feet from solution, blot dry the affected area. Apply ointment and cover if instructed by your doctor.   IF YOUR SKIN BECOMES IRRITATED WHILE USING THESE INSTRUCTIONS, IT IS OKAY TO SWITCH TO  WHITE VINEGAR AND WATER.  As another alternative soak, you may use antibacterial soap and water.  Monitor for any signs/symptoms of infection. Call the office immediately if any occur or go directly to the emergency room. Call with any questions/concerns.  Wash callus area left foot off with soapy water later tonight.  Look for urea 40% cream or ointment and apply to the thickened dry skin / calluses. This can be bought over the counter, at a pharmacy or online such as [employer].

## 2024-04-13 ENCOUNTER — Ambulatory Visit: Admitting: Pulmonary Disease

## 2024-04-19 ENCOUNTER — Ambulatory Visit: Admitting: Podiatry

## 2024-05-03 ENCOUNTER — Ambulatory Visit: Admitting: Podiatry

## 2024-06-15 ENCOUNTER — Ambulatory Visit: Admitting: Pulmonary Disease

## 2024-06-28 ENCOUNTER — Ambulatory Visit: Admitting: Podiatry
